# Patient Record
Sex: Male | Born: 1945 | Race: White | Hispanic: No | Smoking: Former smoker
Health system: Southern US, Community
[De-identification: ages and names within clinical notes are randomized; demographics above are authoritative.]

## PROBLEM LIST (undated history)

## (undated) DIAGNOSIS — M545 Low back pain, unspecified: Secondary | ICD-10-CM

## (undated) DIAGNOSIS — E559 Vitamin D deficiency, unspecified: Secondary | ICD-10-CM

## (undated) DIAGNOSIS — E785 Hyperlipidemia, unspecified: Secondary | ICD-10-CM

## (undated) DIAGNOSIS — G20A1 Parkinson's disease without dyskinesia, without mention of fluctuations: Secondary | ICD-10-CM

## (undated) DIAGNOSIS — K219 Gastro-esophageal reflux disease without esophagitis: Secondary | ICD-10-CM

## (undated) DIAGNOSIS — I10 Essential (primary) hypertension: Secondary | ICD-10-CM

## (undated) DIAGNOSIS — R296 Repeated falls: Secondary | ICD-10-CM

## (undated) DIAGNOSIS — R51 Headache: Secondary | ICD-10-CM

## (undated) DIAGNOSIS — G8929 Other chronic pain: Secondary | ICD-10-CM

## (undated) DIAGNOSIS — G20 Parkinson's disease: Secondary | ICD-10-CM

## (undated) DIAGNOSIS — M1990 Unspecified osteoarthritis, unspecified site: Secondary | ICD-10-CM

## (undated) DIAGNOSIS — F0390 Unspecified dementia without behavioral disturbance: Secondary | ICD-10-CM

## (undated) HISTORY — PX: TONSILLECTOMY: SUR1361

## (undated) HISTORY — DX: Hyperlipidemia, unspecified: E78.5

---
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
## 1991-10-28 HISTORY — PX: EXCISIONAL HEMORRHOIDECTOMY: SHX1541

## 1994-09-03 DIAGNOSIS — R209 Unspecified disturbances of skin sensation: Secondary | ICD-10-CM

## 2001-04-09 ENCOUNTER — Encounter: Admission: RE | Admit: 2001-04-09 | Discharge: 2001-05-26 | Payer: Self-pay | Admitting: Family Medicine

## 2002-01-27 ENCOUNTER — Encounter: Admission: RE | Admit: 2002-01-27 | Discharge: 2002-02-10 | Payer: Self-pay | Admitting: Family Medicine

## 2004-08-27 DIAGNOSIS — K219 Gastro-esophageal reflux disease without esophagitis: Secondary | ICD-10-CM | POA: Insufficient documentation

## 2008-07-17 ENCOUNTER — Ambulatory Visit: Payer: Self-pay | Admitting: Nurse Practitioner

## 2008-07-17 DIAGNOSIS — G4700 Insomnia, unspecified: Secondary | ICD-10-CM | POA: Insufficient documentation

## 2008-07-17 DIAGNOSIS — M545 Low back pain: Secondary | ICD-10-CM

## 2008-07-17 DIAGNOSIS — G589 Mononeuropathy, unspecified: Secondary | ICD-10-CM | POA: Insufficient documentation

## 2008-07-17 LAB — CONVERTED CEMR LAB
Blood Glucose, Fingerstick: 107
Hgb A1c MFr Bld: 5.5 %

## 2008-07-18 ENCOUNTER — Encounter (INDEPENDENT_AMBULATORY_CARE_PROVIDER_SITE_OTHER): Payer: Self-pay | Admitting: Nurse Practitioner

## 2008-07-21 ENCOUNTER — Encounter (INDEPENDENT_AMBULATORY_CARE_PROVIDER_SITE_OTHER): Payer: Self-pay | Admitting: Nurse Practitioner

## 2008-07-24 ENCOUNTER — Ambulatory Visit: Payer: Self-pay | Admitting: Nurse Practitioner

## 2008-07-25 DIAGNOSIS — E7800 Pure hypercholesterolemia, unspecified: Secondary | ICD-10-CM

## 2008-07-25 LAB — CONVERTED CEMR LAB
MCHC: 32.6 g/dL (ref 30.0–36.0)
Platelets: 193 10*3/uL (ref 150–400)
RDW: 13.1 % (ref 11.5–15.5)
Retic Ct Pct: 0.8 % (ref 0.4–3.1)

## 2008-08-21 ENCOUNTER — Ambulatory Visit: Payer: Self-pay | Admitting: Nurse Practitioner

## 2008-08-21 LAB — CONVERTED CEMR LAB
Cholesterol, target level: 200 mg/dL
LDL Goal: 160 mg/dL

## 2008-10-12 ENCOUNTER — Ambulatory Visit: Payer: Self-pay | Admitting: Nurse Practitioner

## 2008-10-12 LAB — CONVERTED CEMR LAB
Alkaline Phosphatase: 39 units/L (ref 39–117)
Bilirubin, Direct: 0.1 mg/dL (ref 0.0–0.3)
Indirect Bilirubin: 0.7 mg/dL (ref 0.0–0.9)
LDL Cholesterol: 102 mg/dL — ABNORMAL HIGH (ref 0–99)
Total Bilirubin: 0.8 mg/dL (ref 0.3–1.2)
Total Protein: 6.7 g/dL (ref 6.0–8.3)
Triglycerides: 88 mg/dL (ref <150)
VLDL: 18 mg/dL (ref 0–40)

## 2008-10-13 ENCOUNTER — Encounter (INDEPENDENT_AMBULATORY_CARE_PROVIDER_SITE_OTHER): Payer: Self-pay | Admitting: Nurse Practitioner

## 2008-10-23 ENCOUNTER — Encounter (INDEPENDENT_AMBULATORY_CARE_PROVIDER_SITE_OTHER): Payer: Self-pay | Admitting: Nurse Practitioner

## 2009-01-23 ENCOUNTER — Ambulatory Visit: Payer: Self-pay | Admitting: Nurse Practitioner

## 2009-01-23 DIAGNOSIS — I10 Essential (primary) hypertension: Secondary | ICD-10-CM

## 2009-05-24 ENCOUNTER — Ambulatory Visit: Payer: Self-pay | Admitting: Nurse Practitioner

## 2009-05-25 ENCOUNTER — Encounter (INDEPENDENT_AMBULATORY_CARE_PROVIDER_SITE_OTHER): Payer: Self-pay | Admitting: Nurse Practitioner

## 2009-05-25 LAB — CONVERTED CEMR LAB
HDL: 56 mg/dL (ref >39)
LDL Cholesterol: 103 mg/dL — ABNORMAL HIGH (ref 0–99)
Total CHOL/HDL Ratio: 3.1
Triglycerides: 64 mg/dL (ref <150)

## 2009-05-29 ENCOUNTER — Encounter (INDEPENDENT_AMBULATORY_CARE_PROVIDER_SITE_OTHER): Payer: Self-pay | Admitting: Nurse Practitioner

## 2009-06-01 ENCOUNTER — Encounter (INDEPENDENT_AMBULATORY_CARE_PROVIDER_SITE_OTHER): Payer: Self-pay | Admitting: Nurse Practitioner

## 2009-06-07 ENCOUNTER — Encounter (INDEPENDENT_AMBULATORY_CARE_PROVIDER_SITE_OTHER): Payer: Self-pay | Admitting: Nurse Practitioner

## 2010-03-28 ENCOUNTER — Ambulatory Visit: Payer: Self-pay | Admitting: Nurse Practitioner

## 2010-03-28 LAB — CONVERTED CEMR LAB
Basophils Absolute: 0 10*3/uL (ref 0.0–0.1)
Basophils Relative: 0 % (ref 0–1)
CO2: 31 meq/L (ref 19–32)
Cholesterol: 191 mg/dL (ref 0–200)
Creatinine, Ser: 0.93 mg/dL (ref 0.40–1.50)
Eosinophils Absolute: 0.1 10*3/uL (ref 0.0–0.7)
Glucose, Bld: 76 mg/dL (ref 70–99)
HDL: 59 mg/dL (ref >39)
MCHC: 32 g/dL (ref 30.0–36.0)
MCV: 96.2 fL (ref 78.0–100.0)
Monocytes Absolute: 0.3 10*3/uL (ref 0.1–1.0)
Monocytes Relative: 7 % (ref 3–12)
Neutrophils Relative %: 56 % (ref 43–77)
RBC: 4.51 M/uL (ref 4.22–5.81)
RDW: 13.3 % (ref 11.5–15.5)
Total Bilirubin: 0.8 mg/dL (ref 0.3–1.2)
Total CHOL/HDL Ratio: 3.2
Triglycerides: 68 mg/dL (ref <150)
VLDL: 14 mg/dL (ref 0–40)

## 2010-03-29 ENCOUNTER — Encounter (INDEPENDENT_AMBULATORY_CARE_PROVIDER_SITE_OTHER): Payer: Self-pay | Admitting: Nurse Practitioner

## 2010-11-28 NOTE — Progress Notes (Signed)
Summary: Office Visit//DEPRESSION SCREENING  Office Visit//DEPRESSION SCREENING   Imported By: Arta Bruce 05/06/2010 15:12:23  _____________________________________________________________________  External Attachment:    Type:   Image     Comment:   External Document

## 2010-11-28 NOTE — Letter (Signed)
Summary: Lipid Letter  [facility]  [address] [place], [place] [address]   Phone: [phone]  Fax: [phone]    03/29/2010  Robet Crutchfield [address] [place], [place]  [address]  Dear Molly Maduro:  We have carefully reviewed your last lipid profile from 03/28/2010 and the results are noted below with a summary of recommendations for lipid management.    Cholesterol:       191     Goal: less than 200   HDL "good" Cholesterol:   59     Goal: greater than 40   LDL "bad" Cholesterol:   118     Goal: less than 130   Triglycerides:       68     Goal: less than 150    Labs done during recent office visit were ok.  Your cholesterol medication has been sent to the pharmacy.     Current Medications: 1)    Amitriptyline Hcl 25 Mg Tabs (Amitriptyline hcl) .... One tablet by mouth at night as needed 2)    Ibuprofen 800 Mg Tabs (Ibuprofen) .Marland Kitchen.. 1 tablet by mouth three times a day as needed for pain **take with food** 3)    Aspirin Ec Lo-dose 81 Mg Tbec (Aspirin) .Marland Kitchen.. 1 tablet by mouth daily 4)    Pravachol 20 Mg Tabs (Pravastatin sodium) .Marland Kitchen.. 1 tablet by mouth at night for cholesterol 5)    Flexeril 10 Mg Tabs (Cyclobenzaprine hcl) .... One tablet by mouth nighty as needed for spasms  If you have any questions, please call. We appreciate being able to work with you.   Sincerely,    [facility] Lehman Prom FNP

## 2010-11-28 NOTE — Assessment & Plan Note (Signed)
Summary: Hypercholesterolemia   Vital Signs:  Patient profile:   65 year old male Weight:      159.7 pounds BMI:     25.10 BSA:     1.84 Temp:     97.7 degrees F oral Pulse rate:   76 / minute Pulse rhythm:   regular Resp:     20 per minute BP sitting:   148 / 94  (left arm) Cuff size:   regular  Vitals Entered By: Levon Hedger (March 28, 2010 10:01 AM) CC: follow-up visit cholesterol, Lipid Management, Back Pain Is Patient Diabetic? No Pain Assessment Patient in pain? yes     Location: back Intensity: 8 Onset of pain  Chronic  Does patient need assistance? Functional Status Self care Ambulation Normal   CC:  follow-up visit cholesterol, Lipid Management, and Back Pain.  History of Present Illness:  Pt into the office for follow up.  Tdap - last within 10 years although pt is unsure of how long ago.  Guessing in 5 ago and pt is not willing to get today despite the fact he is not sure when last immunization was given        Back Pain History:      The patient's back pain has been present for > 6 weeks.  The pain is located in the lower back region and does not radiate below the knees.  He states this is not work related.  He states that he has had a prior history of back pain.  The patient has not had any recent physical therapy for his back pain.  The following makes the back pain better: standing.  The following makes the back pain worse: sitting.        Description of injury in patient's own words:  chronic back pain - injured in a accident years ago last week lifted or turned improperly which has flared back pain.  Usually knows to limit activities but pain has persisted.  He is requesting refills on ibuprofen and flexeril (last Rx 1 year ago) during similar flare.    Critical Exclusionary Diagnosis Criteria (CEDC) for Back Pain:      The patient denies a history of previous trauma.  He has no prior history of spinal surgery.  There are no symptoms to suggest  infection, cancer, cauda equina, or psychosocial factors for back pain.  Other positive CEDC factors include low back pain worse with activity.    Lipid Management History:      Positive NCEP/ATP III risk factors include male age 4 years old or older.  Negative NCEP/ATP III risk factors include non-diabetic, no family history for ischemic heart disease, non-tobacco-user status, non-hypertensive, no ASHD (atherosclerotic heart disease), no prior stroke/TIA, no peripheral vascular disease, and no history of aortic aneurysm.        The patient states that he knows about the "Therapeutic Lifestyle Change" diet.  The patient does not know about adjunctive measures for cholesterol lowering.  He expresses no side effects from his lipid-lowering medication.  Comments include: Pt is still taking pravachol as ordered.  The patient denies any symptoms to suggest myopathy or liver disease.      Habits & Providers  Alcohol-Tobacco-Diet     Alcohol drinks/day: 0     Tobacco Status: quit     Tobacco Counseling: to remain off tobacco products     Year Quit: age 83  Exercise-Depression-Behavior     Does Patient Exercise: no  Have you felt down or hopeless? no     Have you felt little pleasure in things? no     Depression Counseling: not indicated; screening negative for depression     Drug Use: no     Seat Belt Use: 100     Sun Exposure: occasionally  Comments: PHQ-9 score = 5  Allergies (verified): No Known Drug Allergies  Review of Systems CV:  Denies chest pain or discomfort. Resp:  Denies cough. GI:  Denies abdominal pain, nausea, and vomiting. GU:  Complains of nocturia; denies dysuria. MS:  Complains of low back pain.  Physical Exam  General:  alert.   Head:  normocephalic.   Eyes:  glasses Lungs:  normal breath sounds.   Heart:  normal rate and regular rhythm.   Prostate:  REFUSED Neurologic:  alert & oriented X3.   slow steady gait   Detailed Back/Spine  Exam  Lumbosacral Exam:  Inspection-deformity:    Normal Palpation-spinal tenderness:  Abnormal    Location:  L4-L5     pt will not sit during the exam   Impression & Recommendations:  Problem # 1:  HYPERCHOLESTEROLEMIA (ICD-272.0) will check labs today His updated medication list for this problem includes:    Pravachol 20 Mg Tabs (Pravastatin sodium) .Marland Kitchen... 1 tablet by mouth at night for cholesterol  Orders: T-Lipid Profile ([address]109-60454) T-Comprehensive Metabolic Panel [phone]) T-CBC w/Diff ([id number]) T-TSH ([id number])  Problem # 2:  ELEVATED BLOOD PRESSURE (ICD-796.2) reviewed indications with pt pt to check at local pharmacy advised him of low sodium diet handout given REFUSED EKG TODAY REFUSED U/A SO UNABLE TO CHECK MICROALBUMIN  Problem # 3:  LOW BACK PAIN, MILD (ICD-724.2) pt advised that he can continue to do back exercises as previously assigned by physical therapy will restart ibuprofen and flexeril may also consider physical therapy if back pain continues His updated medication list for this problem includes:    Ibuprofen 800 Mg Tabs (Ibuprofen) .Marland Kitchen... 1 tablet by mouth three times a day as needed for pain **take with food**    Aspirin Ec Lo-dose 81 Mg Tbec (Aspirin) .Marland Kitchen... 1 tablet by mouth daily    Flexeril 10 Mg Tabs (Cyclobenzaprine hcl) ..... One tablet by mouth nighty as needed for spasms  Orders: T-PSA ([id number]) Rapid HIV  (02725)  Problem # 4:  GERD (ICD-530.81) symptoms stable Orders: T-TSH ([id number])  Problem # 5:  Preventive Health Care (ICD-V70.0) pt declined EKG, rectal and prostate exam  Complete Medication List: 1)  Amitriptyline Hcl 25 Mg Tabs (Amitriptyline hcl) .... One tablet by mouth at night as needed 2)  Ibuprofen 800 Mg Tabs (Ibuprofen) .Marland Kitchen.. 1 tablet by mouth three times a day as needed for pain **take with food** 3)  Aspirin Ec Lo-dose 81 Mg Tbec (Aspirin) .Marland Kitchen.. 1 tablet by mouth daily 4)  Pravachol 20 Mg Tabs  (Pravastatin sodium) .Marland Kitchen.. 1 tablet by mouth at night for cholesterol 5)  Flexeril 10 Mg Tabs (Cyclobenzaprine hcl) .... One tablet by mouth nighty as needed for spasms  Lipid Assessment/Plan:      Based on NCEP/ATP III, the patient's risk factor category is "0-1 risk factors".  The patient's lipid goals are as follows: Total cholesterol goal is 200; LDL cholesterol goal is 160; HDL cholesterol goal is 40; Triglyceride goal is 150.    Patient Instructions: 1)  Your blood pressure today is 148/94 2)  Be sure to keep a watch on your blood pressure 3)  Goal 130/80's 4)  If you see  it consecutively above this number you may need medications. 5)  Cholesterol - labs will be checked today.  Will send a new prescription to the pharmacy once labs reviewed. 6)  Follow up in 6 - 8 months with n.martin for cholesterol and high blood pressure Prescriptions: FLEXERIL 10 MG TABS (CYCLOBENZAPRINE HCL) One tablet by mouth nighty as needed for spasms  #30 x 0   Entered and Authorized by:   Lehman Prom FNP   Signed by:   Lehman Prom FNP on 03/28/2010   Method used:   Print then Give to Patient   RxID:   [id number] IBUPROFEN 800 MG TABS (IBUPROFEN) 1 tablet by mouth three times a day as needed for pain **Take with food**  #50 x 0   Entered and Authorized by:   Lehman Prom FNP   Signed by:   Lehman Prom FNP on 03/28/2010   Method used:   Print then Give to Patient   RxID:   [id number]   Prevention & Chronic Care Immunizations   Influenza vaccine: Not documented    Tetanus booster: 10/28/2003: Historical per pt    Pneumococcal vaccine: Not documented    H. zoster vaccine: Not documented  Colorectal Screening   Hemoccult: Not documented   Hemoccult action/deferral: Refused  (03/28/2010)    Colonoscopy: Not documented   Colonoscopy action/deferral: Refused  (03/28/2010)  Other Screening   PSA: 1.97  (07/24/2008)   PSA ordered.   PSA action/deferral: Discussed-PSA  requested  (03/28/2010)   Smoking status: quit  (03/28/2010)  Lipids   Total Cholesterol: 172  (05/25/2009)   Lipid panel action/deferral: Lipid Panel ordered   LDL: 103  (05/25/2009)   LDL Direct: Not documented   HDL: 56  (05/25/2009)   Triglycerides: 64  (05/25/2009)    SGOT (AST): 24  (10/12/2008)   SGPT (ALT): 29  (10/12/2008) CMP ordered    Alkaline phosphatase: 39  (10/12/2008)   Total bilirubin: 0.8  (10/12/2008)  Self-Management Support :    Lipid self-management support: Not documented   Laboratory Results    Other Tests  Rapid HIV: negative

## 2010-12-11 ENCOUNTER — Encounter (INDEPENDENT_AMBULATORY_CARE_PROVIDER_SITE_OTHER): Payer: Self-pay | Admitting: Nurse Practitioner

## 2010-12-11 ENCOUNTER — Encounter: Payer: Self-pay | Admitting: Nurse Practitioner

## 2010-12-11 DIAGNOSIS — H6120 Impacted cerumen, unspecified ear: Secondary | ICD-10-CM | POA: Insufficient documentation

## 2010-12-18 NOTE — Assessment & Plan Note (Signed)
Summary: Cerumen Impaction   Vital Signs:  Patient profile:   65 year old male Weight:      156.2 pounds BMI:     24.55 Temp:     97.1 degrees F oral Pulse rate:   72 / minute Pulse rhythm:   regular Resp:     16 per minute BP sitting:   150 / 94  (left arm) Cuff size:   regular  Vitals Entered By: Levon Hedger (December 11, 2010 11:27 AM) CC: earache...having trouble hearing out of left ear...glands are swollen, Ear pain, Hypertension Management Is Patient Diabetic? No Pain Assessment Patient in pain? no       Does patient need assistance? Functional Status Self care Ambulation Normal   CC:  earache...having trouble hearing out of left ear...glands are swollen, Ear pain, and Hypertension Management.  History of Present Illness: Pt into the office with c/o ear pain  Ear Pain      This is a 65 year old man who presents with Ear pain.  The symptoms began 1 week ago.  On a scale of mild to severe, the intensity is described as a moderate.  The patient complains of hearing loss, but denies ear discharge and fever.  The pain is located in both ears.  The pain is described as intermittent.  The patient denies headache, pressure, and vertigo.  Admits to using Q-tips in his ears  Hypertension History:      He denies headache, chest pain, and palpitations.  "I get excited when I come here".        Positive major cardiovascular risk factors include male age 63 years old or older, hyperlipidemia, and hypertension.  Negative major cardiovascular risk factors include no history of diabetes, negative family history for ischemic heart disease, and non-tobacco-user status.        Further assessment for target organ damage reveals no history of ASHD, stroke/TIA, or peripheral vascular disease.    Medications Prior to Update: 1)  Amitriptyline Hcl 25 Mg Tabs (Amitriptyline Hcl) .... One Tablet By Mouth At Night As Needed 2)  Ibuprofen 800 Mg Tabs (Ibuprofen) .Marland Kitchen.. 1 Tablet By Mouth Three  Times A Day As Needed For Pain **take With Food** 3)  Aspirin Ec Lo-Dose 81 Mg Tbec (Aspirin) .Marland Kitchen.. 1 Tablet By Mouth Daily 4)  Pravachol 20 Mg Tabs (Pravastatin Sodium) .Marland Kitchen.. 1 Tablet By Mouth At Night For Cholesterol 5)  Flexeril 10 Mg Tabs (Cyclobenzaprine Hcl) .... One Tablet By Mouth Nighty As Needed For Spasms  Allergies (verified): No Known Drug Allergies  Review of Systems General:  Complains of sleep disorder; denies fever; occasionally pt is taking amitriptyline for sleep. Last rx in 2009 and pt is requesting a refill. ENT:  Complains of decreased hearing and earache. CV:  Denies chest pain or discomfort. Resp:  Denies cough. GI:  Denies abdominal pain, nausea, and vomiting.  Physical Exam  General:  alert.   Head:  normocephalic.   Eyes:  glasses Ears:  bil ears with cerumen impaction L>R unable to visualize TM after procedure left - erythema to canal with some cerumen right - still with complete occlusion of TM   Impression & Recommendations:  Problem # 1:  CERUMEN IMPACTION, BILATERAL (ICD-380.4)  unable to see TM following irrigation  pt to use ear drops and return in 1 weeks for irrigation  Orders: Cerumen Impaction Removal (29528)  Problem # 2:  HYPERCHOLESTEROLEMIA (ICD-272.0)  His updated medication list for this problem includes:  Pravachol 20 Mg Tabs (Pravastatin sodium) .Marland Kitchen... 1 tablet by mouth at night for cholesterol  Problem # 3:  INSOMNIA (ICD-780.52) will refill amitriptyline  Problem # 4:  HYPERTENSION, ESSENTIAL (ICD-401.9)  His updated medication list for this problem includes:    Lisinopril 5 Mg Tabs (Lisinopril) ..... One tablet by mouth daily for blood pressure  Complete Medication List: 1)  Amitriptyline Hcl 25 Mg Tabs (Amitriptyline hcl) .... One tablet by mouth at night as needed 2)  Ibuprofen 800 Mg Tabs (Ibuprofen) .Marland Kitchen.. 1 tablet by mouth three times a day as needed for pain **take with food** 3)  Aspirin Ec Lo-dose 81 Mg Tbec  (Aspirin) .Marland Kitchen.. 1 tablet by mouth daily 4)  Pravachol 20 Mg Tabs (Pravastatin sodium) .Marland Kitchen.. 1 tablet by mouth at night for cholesterol 5)  Flexeril 10 Mg Tabs (Cyclobenzaprine hcl) .... One tablet by mouth nighty as needed for spasms 6)  Lisinopril 5 Mg Tabs (Lisinopril) .... One tablet by mouth daily for blood pressure 7)  Antipyrine-benzocaine 5.4-1.4 % Soln (Benzocaine-antipyrine) .... 2 drops in both ears two times a day  Hypertension Assessment/Plan:      The patient's hypertensive risk group is category B: At least one risk factor (excluding diabetes) with no target organ damage.  His calculated 10 year risk of coronary heart disease is 18 %.  Today's blood pressure is 150/94.  His blood pressure goal is < 140/90.   Patient Instructions: 1)  Remember do not use Q-tips in your ears.  This only pushes the ear wax back into your ear. 2)  You can get debrox over the counter to soften the wax so it can be removed with your fingertips. 3)  Use ear drops - 2 drops in each ear for 1 week 4)  Blood pressure - elevated on mulitple visits.  You do need to increase your exercise but also need to start a low dose BP medication to take some of the stress off your heart. 5)  Schedule Triage nurse visit in 1 week for ear irrigation and blood pressure check. Take your medications before this visit 6)  Follow up with provider in 6 months for cholesterol. Come fasting for labs. Prescriptions: ANTIPYRINE-BENZOCAINE 5.4-1.4 % SOLN (BENZOCAINE-ANTIPYRINE) 2 drops in both ears two times a day  #92ml x 0   Entered and Authorized by:   Lehman Prom FNP   Signed by:   Lehman Prom FNP on 12/11/2010   Method used:   Print then Give to Patient   RxID:   [id number] LISINOPRIL 5 MG TABS (LISINOPRIL) One tablet by mouth daily for blood pressure  #30 x 5   Entered and Authorized by:   Lehman Prom FNP   Signed by:   Lehman Prom FNP on 12/11/2010   Method used:   Print then Give to Patient   RxID:    [id number] PRAVACHOL 20 MG TABS (PRAVASTATIN SODIUM) 1 tablet by mouth at night for cholesterol  #90 x 1   Entered and Authorized by:   Lehman Prom FNP   Signed by:   Lehman Prom FNP on 12/11/2010   Method used:   Print then Give to Patient   RxID:   [id number] AMITRIPTYLINE HCL 25 MG TABS (AMITRIPTYLINE HCL) One tablet by mouth at night as needed  #30 x 1   Entered and Authorized by:   Lehman Prom FNP   Signed by:   Lehman Prom FNP on 12/11/2010   Method used:   Print then Give to Patient  RxID:   [id number]    Orders Added: 1)  Est. Patient Level III [14782] 2)  Cerumen Impaction Removal [95621]

## 2010-12-19 ENCOUNTER — Encounter: Payer: Self-pay | Admitting: Nurse Practitioner

## 2010-12-19 ENCOUNTER — Encounter (INDEPENDENT_AMBULATORY_CARE_PROVIDER_SITE_OTHER): Payer: Self-pay | Admitting: Nurse Practitioner

## 2010-12-24 NOTE — Assessment & Plan Note (Signed)
Summary: BP recheck and ear irrigation  Nurse Visit   Vital Signs:  Patient profile:   65 year old male Weight:      156.5 pounds Temp:     96.9 degrees F oral Pulse rate:   64 / minute Pulse rhythm:   regular Resp:     16 per minute BP sitting:   124 / 88  (left arm) Cuff size:   regular  Vitals Entered By: Dutch Quint RN (December 19, 2010 2:24 PM)  CC:  BP recheck and ear irrigation.  History of Present Illness: 12/11/10  BP 150/94  P 72.  Had cerumen impaction, ears irrigated.  Was to use ear drops and return for f/u irrigation with BP check after starting lisinopril 5 mg. daily.  Used the ear drops as instructed, also taking lisinopril daily, took meds today.   Review of Systems ENT:  Hearing slightly improved in left ear, denies ear pain, discharge, redness or pressure.. CV:  Denies CP, SOB, dizziness, peripheral edema, cough, headache.  Has occasional foot cramping, resolves without intervention other than movement of extremity..   Impression & Recommendations:  Problem # 1:  CERUMEN IMPACTION, BILATERAL (ICD-380.4)  Both ears irrigated after use of otic drops Return of large amount dark cerumen States hearing improved F/U as needed  Orders: Cerumen Impaction Removal (84132)  Problem # 2:  HYPERTENSION, ESSENTIAL (ICD-401.9) BP within normal range F/U at scheduled appt. with provider  His updated medication list for this problem includes:    Lisinopril 5 Mg Tabs (Lisinopril) ..... One tablet by mouth daily for blood pressure  Complete Medication List: 1)  Amitriptyline Hcl 25 Mg Tabs (Amitriptyline hcl) .... One tablet by mouth at night as needed 2)  Ibuprofen 800 Mg Tabs (Ibuprofen) .Marland Kitchen.. 1 tablet by mouth three times a day as needed for pain **take with food** 3)  Aspirin Ec Lo-dose 81 Mg Tbec (Aspirin) .Marland Kitchen.. 1 tablet by mouth daily 4)  Pravachol 20 Mg Tabs (Pravastatin sodium) .Marland Kitchen.. 1 tablet by mouth at night for cholesterol 5)  Flexeril 10 Mg Tabs  (Cyclobenzaprine hcl) .... One tablet by mouth nighty as needed for spasms 6)  Lisinopril 5 Mg Tabs (Lisinopril) .... One tablet by mouth daily for blood pressure 7)  Antipyrine-benzocaine 5.4-1.4 % Soln (Benzocaine-antipyrine) .... 2 drops in both ears two times a day   Physical Exam  General:  alert, well-developed, well-nourished, and well-hydrated.   Ears:  Right ear reddened at top of canal, cerumen present.  After irrigation, TM visualized.  Left TM unable to visualize before irrigation - no redness noted, TM visualized after irrigation, normal   Patient Instructions: 1)  Reviewed with Jesse Fall 2)  Your blood pressure is within normal range 3)  Continue medications as ordered, important that you take daily, without missing doses.  Call for refills before you run out. 4)  Your ears were irrigated and wax was removed.  Your ears may feel "full" for a little while, until the water dries.  You are already having improvement with your hearing -- let us know if that changes. 5)  Keep your scheduled appointment with provider for follow-up with your blood pressure and general health. 6)  Call if anything changes or if you have any questions.  CC: BP recheck and ear irrigation Is Patient Diabetic? No Pain Assessment Patient in pain? no       Does patient need assistance? Functional Status Self care Ambulation Normal   Allergies: No Known Drug Allergies  Orders Added: 1)  Est. Patient Level I [16109] 2)  Cerumen Impaction Removal [60454]

## 2013-11-30 ENCOUNTER — Emergency Department (HOSPITAL_COMMUNITY): Payer: PRIVATE HEALTH INSURANCE

## 2013-11-30 ENCOUNTER — Inpatient Hospital Stay (HOSPITAL_COMMUNITY)
Admission: EM | Admit: 2013-11-30 | Discharge: 2013-12-02 | DRG: 558 | Disposition: A | Discharge status: Skilled Nursing Facility | Payer: PRIVATE HEALTH INSURANCE | Attending: Internal Medicine | Admitting: Internal Medicine

## 2013-11-30 ENCOUNTER — Encounter (HOSPITAL_COMMUNITY): Payer: Self-pay | Admitting: Emergency Medicine

## 2013-11-30 DIAGNOSIS — Z87891 Personal history of nicotine dependence: Secondary | ICD-10-CM

## 2013-11-30 DIAGNOSIS — Z9181 History of falling: Secondary | ICD-10-CM

## 2013-11-30 DIAGNOSIS — G912 (Idiopathic) normal pressure hydrocephalus: Secondary | ICD-10-CM | POA: Diagnosis present

## 2013-11-30 DIAGNOSIS — Y92009 Unspecified place in unspecified non-institutional (private) residence as the place of occurrence of the external cause: Secondary | ICD-10-CM

## 2013-11-30 DIAGNOSIS — I10 Essential (primary) hypertension: Secondary | ICD-10-CM | POA: Diagnosis present

## 2013-11-30 DIAGNOSIS — G20 Parkinson's disease: Secondary | ICD-10-CM | POA: Diagnosis present

## 2013-11-30 DIAGNOSIS — E860 Dehydration: Secondary | ICD-10-CM

## 2013-11-30 DIAGNOSIS — H5316 Psychophysical visual disturbances: Secondary | ICD-10-CM | POA: Diagnosis present

## 2013-11-30 DIAGNOSIS — E46 Unspecified protein-calorie malnutrition: Secondary | ICD-10-CM | POA: Diagnosis present

## 2013-11-30 DIAGNOSIS — R209 Unspecified disturbances of skin sensation: Secondary | ICD-10-CM

## 2013-11-30 DIAGNOSIS — F0280 Dementia in other diseases classified elsewhere without behavioral disturbance: Secondary | ICD-10-CM | POA: Diagnosis present

## 2013-11-30 DIAGNOSIS — W19XXXA Unspecified fall, initial encounter: Secondary | ICD-10-CM

## 2013-11-30 DIAGNOSIS — R269 Unspecified abnormalities of gait and mobility: Secondary | ICD-10-CM

## 2013-11-30 DIAGNOSIS — K219 Gastro-esophageal reflux disease without esophagitis: Secondary | ICD-10-CM

## 2013-11-30 DIAGNOSIS — M6282 Rhabdomyolysis: Principal | ICD-10-CM | POA: Diagnosis present

## 2013-11-30 DIAGNOSIS — R64 Cachexia: Secondary | ICD-10-CM | POA: Diagnosis present

## 2013-11-30 DIAGNOSIS — R296 Repeated falls: Secondary | ICD-10-CM

## 2013-11-30 DIAGNOSIS — R413 Other amnesia: Secondary | ICD-10-CM

## 2013-11-30 DIAGNOSIS — I959 Hypotension, unspecified: Secondary | ICD-10-CM | POA: Diagnosis present

## 2013-11-30 DIAGNOSIS — IMO0002 Reserved for concepts with insufficient information to code with codable children: Secondary | ICD-10-CM

## 2013-11-30 DIAGNOSIS — G20A1 Parkinson's disease without dyskinesia, without mention of fluctuations: Secondary | ICD-10-CM | POA: Diagnosis present

## 2013-11-30 DIAGNOSIS — R159 Full incontinence of feces: Secondary | ICD-10-CM | POA: Diagnosis present

## 2013-11-30 DIAGNOSIS — R32 Unspecified urinary incontinence: Secondary | ICD-10-CM | POA: Diagnosis present

## 2013-11-30 HISTORY — DX: Low back pain: M54.5

## 2013-11-30 HISTORY — DX: Other chronic pain: G89.29

## 2013-11-30 HISTORY — DX: Low back pain, unspecified: M54.50

## 2013-11-30 HISTORY — DX: Essential (primary) hypertension: I10

## 2013-11-30 HISTORY — DX: Unspecified dementia, unspecified severity, without behavioral disturbance, psychotic disturbance, mood disturbance, and anxiety: F03.90

## 2013-11-30 HISTORY — DX: Unspecified osteoarthritis, unspecified site: M19.90

## 2013-11-30 HISTORY — DX: Parkinson's disease: G20

## 2013-11-30 HISTORY — DX: Headache: R51

## 2013-11-30 HISTORY — DX: Gastro-esophageal reflux disease without esophagitis: K21.9

## 2013-11-30 HISTORY — DX: Parkinson's disease without dyskinesia, without mention of fluctuations: G20.A1

## 2013-11-30 HISTORY — DX: Repeated falls: R29.6

## 2013-11-30 LAB — URINALYSIS W MICROSCOPIC + REFLEX CULTURE
BILIRUBIN URINE: NEGATIVE
Glucose, UA: NEGATIVE mg/dL
Ketones, ur: 40 mg/dL — AB
NITRITE: NEGATIVE
PROTEIN: 30 mg/dL — AB
Specific Gravity, Urine: 1.029 (ref 1.005–1.030)
UROBILINOGEN UA: 0.2 mg/dL (ref 0.0–1.0)
pH: 6 (ref 5.0–8.0)

## 2013-11-30 LAB — CBC WITH DIFFERENTIAL/PLATELET
Basophils Absolute: 0 10*3/uL (ref 0.0–0.1)
Basophils Relative: 0 % (ref 0–1)
Eosinophils Absolute: 0 10*3/uL (ref 0.0–0.7)
Eosinophils Relative: 0 % (ref 0–5)
HEMATOCRIT: 43 % (ref 39.0–52.0)
Hemoglobin: 15.4 g/dL (ref 13.0–17.0)
Lymphocytes Relative: 7 % — ABNORMAL LOW (ref 12–46)
Lymphs Abs: 0.8 10*3/uL (ref 0.7–4.0)
MCH: 32.6 pg (ref 26.0–34.0)
MCHC: 35.8 g/dL (ref 30.0–36.0)
MCV: 90.9 fL (ref 78.0–100.0)
Monocytes Absolute: 0.5 10*3/uL (ref 0.1–1.0)
Monocytes Relative: 4 % (ref 3–12)
Neutro Abs: 11.3 10*3/uL — ABNORMAL HIGH (ref 1.7–7.7)
Neutrophils Relative %: 90 % — ABNORMAL HIGH (ref 43–77)
PLATELETS: 183 10*3/uL (ref 150–400)
RBC: 4.73 MIL/uL (ref 4.22–5.81)
RDW: 12.5 % (ref 11.5–15.5)
WBC: 12.6 10*3/uL — AB (ref 4.0–10.5)

## 2013-11-30 LAB — TSH: TSH: 3.105 u[IU]/mL (ref 0.350–4.500)

## 2013-11-30 LAB — BASIC METABOLIC PANEL
BUN: 33 mg/dL — ABNORMAL HIGH (ref 6–23)
CALCIUM: 9.1 mg/dL (ref 8.4–10.5)
CHLORIDE: 95 meq/L — AB (ref 96–112)
CO2: 27 meq/L (ref 19–32)
Creatinine, Ser: 0.86 mg/dL (ref 0.50–1.35)
GFR calc Af Amer: >90 mL/min (ref >90)
GFR calc non Af Amer: 89 mL/min — ABNORMAL LOW (ref >90)
Glucose, Bld: 85 mg/dL (ref 70–99)
POTASSIUM: 4 meq/L (ref 3.7–5.3)
SODIUM: 137 meq/L (ref 137–147)

## 2013-11-30 LAB — TROPONIN I: Troponin I: <0.3 ng/mL (ref <0.30)

## 2013-11-30 LAB — CK: Total CK: 10184 U/L — ABNORMAL HIGH (ref 7–232)

## 2013-11-30 LAB — LACTIC ACID, PLASMA: LACTIC ACID, VENOUS: 1.4 mmol/L (ref 0.5–2.2)

## 2013-11-30 LAB — VITAMIN B12: Vitamin B-12: 1212 pg/mL — ABNORMAL HIGH (ref 211–911)

## 2013-11-30 MED ORDER — ACETAMINOPHEN 325 MG PO TABS: 650.0000 mg | ORAL_TABLET | Freq: Four times a day (QID) | ORAL | Status: DC | PRN

## 2013-11-30 MED ORDER — SODIUM CHLORIDE 0.9 % IV BOLUS (SEPSIS): 500.0000 mL | Freq: Once | INTRAVENOUS | Status: DC

## 2013-11-30 MED ORDER — SODIUM CHLORIDE 0.9 % IV SOLN
INTRAVENOUS | Status: DC
  Administered 2013-11-30 – 2013-12-02 (×4): via INTRAVENOUS

## 2013-11-30 MED ORDER — HYDROCODONE-ACETAMINOPHEN 5-325 MG PO TABS: 1.0000 | ORAL_TABLET | ORAL | Status: DC | PRN

## 2013-11-30 MED ORDER — SODIUM CHLORIDE 0.9 % IV BOLUS (SEPSIS)
500.0000 mL | Freq: Once | INTRAVENOUS | Status: AC
  Administered 2013-11-30: 500 mL via INTRAVENOUS

## 2013-11-30 MED ORDER — ENSURE COMPLETE PO LIQD
237.0000 mL | Freq: Two times a day (BID) | ORAL | Status: DC
  Administered 2013-12-01 – 2013-12-02 (×2): 237 mL via ORAL

## 2013-11-30 MED ORDER — SODIUM CHLORIDE 0.9 % IJ SOLN
3.0000 mL | Freq: Two times a day (BID) | INTRAMUSCULAR | Status: DC
  Administered 2013-11-30: 3 mL via INTRAVENOUS

## 2013-11-30 MED ORDER — ENOXAPARIN SODIUM 40 MG/0.4ML ~~LOC~~ SOLN
40.0000 mg | SUBCUTANEOUS | Status: DC
  Administered 2013-11-30 – 2013-12-02 (×3): 40 mg via SUBCUTANEOUS
  Filled 2013-11-30 (×3): qty 0.4

## 2013-11-30 MED ORDER — ACETAMINOPHEN 650 MG RE SUPP: 650.0000 mg | Freq: Four times a day (QID) | RECTAL | Status: DC | PRN

## 2013-11-30 MED ORDER — ROPINIROLE HCL 1 MG PO TABS
1.0000 mg | ORAL_TABLET | Freq: Four times a day (QID) | ORAL | Status: DC
  Administered 2013-11-30 – 2013-12-02 (×8): 1 mg via ORAL
  Filled 2013-11-30 (×9): qty 1

## 2013-11-30 MED ORDER — ONDANSETRON HCL 4 MG/2ML IJ SOLN: 4.0000 mg | Freq: Four times a day (QID) | INTRAMUSCULAR | Status: DC | PRN

## 2013-11-30 MED ORDER — SENNOSIDES-DOCUSATE SODIUM 8.6-50 MG PO TABS: 1.0000 | ORAL_TABLET | Freq: Every evening | ORAL | Status: DC | PRN

## 2013-11-30 MED ORDER — SODIUM CHLORIDE 0.9 % IV SOLN
INTRAVENOUS | Status: DC
  Administered 2013-11-30: 13:00:00 via INTRAVENOUS

## 2013-11-30 MED ORDER — ONDANSETRON HCL 4 MG PO TABS: 4.0000 mg | ORAL_TABLET | Freq: Four times a day (QID) | ORAL | Status: DC | PRN
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
## 2013-11-30 NOTE — ED Notes (Signed)
Pt was not stood up for orthostatic vitals due to weakness and low blood pressure from sitting.

## 2013-11-30 NOTE — ED Notes (Signed)
Pt moved to E46 for better visual access from Nursing Station.

## 2013-11-30 NOTE — ED Notes (Signed)
EMS called out for fall. Pt stated he had not been down long, but stated it was the 3rd time he had fallen in the last 24 hours. Hx of dementia and parkinson's.  Denies pain, but states right elbow is stiff. Abrasions present on skin from previous falls. VS- 117/73 HR-68 RR-16 95% RA

## 2013-11-30 NOTE — Progress Notes (Signed)
Kevin Gould [id number] Code Status: FULL   Admission Data: 11/30/2013 4:07 PM Attending Provider:  Sunnie Gould Kevin W, MD Consults/ Treatment Team:    Kevin Gould is a 68 y.o. male patient admitted from ED awake, alert - oriented  X 3 - no acute distress noted.  VSS - Blood pressure 108/69, pulse 79, temperature 97.8 F (36.6 C), temperature source Oral, resp. rate 17, SpO2 96.00%.  no c/o shortness of breath, no c/o chest pain. Cardiac tele # [id number], in place. IV Fluids:  IV in place, occlusive dsg intact without redness, IV cath forearm left, condition patent and no redness normal saline.  Allergies:  No Known Allergies   Past Medical History  Diagnosis Date  . Dementia   . Parkinson's disease   . Hypertension    Medications Prior to Admission  Medication Sig Dispense Refill  . rOPINIRole (REQUIP) 1 MG tablet Take 1 mg by mouth 4 (four) times daily.       History:  obtained from the patient. Tobacco/alcohol: denied none  Orientation to room, and floor completed with information packet given to patient/family.  Patient declined safety video at this time.  Admission INP armband ID verified with patient/family, and in place.   SR up x 2, fall assessment complete, with patient and family able to verbalize understanding of risk associated with falls, and verbalized understanding to call nsg before up out of bed.  Call light within reach, patient able to voice, and demonstrate understanding.  Reddened sacrum noted on admission. Elbow abrasions and right shin abrasion from fall at home.     Will cont to eval and treat per MD orders.  Kevin Gould, Kevin Gould C, RN 11/30/2013 4:07 PM

## 2013-11-30 NOTE — ED Provider Notes (Signed)
CSN: [id number]     Arrival date & time 11/30/13  1113 History   First MD Initiated Contact with Patient 11/30/13 1151     Chief Complaint  Patient presents with  . Fall    HPI Pt was seen at 1150. Per pt, c/o gradual onset and worsening of persistent generalized weakness/fatigue for the past several weeks, worse over the past several days. Pt states he has fallen 3 times in the past 1 day due to weakness, with his last fall late last evening. States he laid on the floor for several hours before he was able to pull himself up onto some furniture. Pt states he lives alone and his neighbor's check on him regularly, and that was how he was found today. Pt also feels he has been "more confused than usual" over the past several weeks. Denies syncope, no N/V/D, no abd pain, no CP/SOB, no cough, no fevers.    Past Medical History  Diagnosis Date  . Dementia   . Parkinson's disease   . Hypertension    Past Surgical History  Procedure Laterality Date  . Tonsillectomy      History  Substance Use Topics  . Smoking status: Former Games developermoker  . Smokeless tobacco: Not on file  . Alcohol Use: No    Review of Systems ROS: Statement: All systems negative except as marked or noted in the HPI; Constitutional: Negative for fever and chills.+generalized weakness/fatigue. ; ; Eyes: Negative for eye pain, redness and discharge. ; ; ENMT: Negative for ear pain, hoarseness, nasal congestion, sinus pressure and sore throat. ; ; Cardiovascular: Negative for chest pain, palpitations, diaphoresis, dyspnea and peripheral edema. ; ; Respiratory: Negative for cough, wheezing and stridor. ; ; Gastrointestinal: Negative for nausea, vomiting, diarrhea, abdominal pain, blood in stool, hematemesis, jaundice and rectal bleeding. . ; ; Genitourinary: Negative for dysuria, flank pain and hematuria. ; ; Musculoskeletal: Negative for back pain and neck pain. Negative for swelling and trauma.; ; Skin: Negative for pruritus, rash,  abrasions, blisters, bruising and skin lesion.; ; Neuro: +confusion.Negative for headache, lightheadedness and neck stiffness. Negative for altered level of consciousness , altered mental status, extremity weakness, paresthesias, involuntary movement, seizure and syncope.     Allergies  Review of patient's allergies indicates no known allergies.  Home Medications   Current Outpatient Rx  Name  Route  Sig  Dispense  Refill  . rOPINIRole (REQUIP) 1 MG tablet   Oral   Take 1 mg by mouth 4 (four) times daily.          BP 87/74  Pulse 90  Temp(Src) 97.6 F (36.4 C) (Oral)  Resp 16  SpO2 99% Filed Vitals:   11/30/13 1200 11/30/13 1334 11/30/13 1335 11/30/13 1400  BP: 111/95 98/70 87/74  104/65  Pulse: 86 73 90 87  Temp:      TempSrc:      Resp:    16  SpO2: 99%   100%    Physical Exam 1155: Physical examination:  Nursing notes reviewed; Vital signs and O2 SAT reviewed;  Constitutional: Thin, disheveled. In no acute distress; Head:  Normocephalic, atraumatic; Eyes: EOMI, PERRL, No scleral icterus; ENMT: Mouth and pharynx normal, Mucous membranes dry; Neck: Supple, Full range of motion, No lymphadenopathy; Cardiovascular: Regular rate and rhythm, No gallop; Respiratory: Breath sounds clear & equal bilaterally, No wheezes.  Speaking full sentences with ease, Normal respiratory effort/excursion; Chest: Nontender, Movement normal; Abdomen: Soft, Nontender, Nondistended, Normal bowel sounds; Genitourinary: No CVA tenderness; Extremities: Pulses normal,  No tenderness, No edema, No calf edema or asymmetry.; Neuro: AA&Ox3, Major CN grossly intact. No facial droop. Speech clear. Moves extremities on stretcher..; Skin: Color normal, Warm, Dry.   ED Course  Procedures      EKG Interpretation   None       MDM  MDM Reviewed: previous chart, nursing note and vitals Reviewed previous: labs and ECG Interpretation: labs, ECG, x-ray and CT scan Total time providing critical care: 30-74  minutes. This excludes time spent performing separately reportable procedures and services. Consults: admitting MD   CRITICAL CARE Performed by: Laray Anger Total critical care time: 35 Critical care time was exclusive of separately billable procedures and treating other patients. Critical care was necessary to treat or prevent imminent or life-threatening deterioration. Critical care was time spent personally by me on the following activities: development of treatment plan with patient and/or surrogate as well as nursing, discussions with consultants, evaluation of patient's response to treatment, examination of patient, obtaining history from patient or surrogate, ordering and performing treatments and interventions, ordering and review of laboratory studies, ordering and review of radiographic studies, pulse oximetry and re-evaluation of patient's condition.   Date: 11/30/2013  Rate: 74  Rhythm: normal sinus rhythm and premature atrial contractions (PAC), artifact  QRS Axis: normal  Intervals: normal  ST/T Wave abnormalities: normal  Conduction Disutrbances:none  Narrative Interpretation:   Old EKG Reviewed: none available.      Results for orders placed during the hospital encounter of 11/30/13  URINALYSIS W MICROSCOPIC + REFLEX CULTURE      Result Value Range   Color, Urine AMBER (*) YELLOW   APPearance HAZY (*) CLEAR   Specific Gravity, Urine 1.029  1.005 - 1.030   pH 6.0  5.0 - 8.0   Glucose, UA NEGATIVE  NEGATIVE mg/dL   Hgb urine dipstick MODERATE (*) NEGATIVE   Bilirubin Urine NEGATIVE  NEGATIVE   Ketones, ur 40 (*) NEGATIVE mg/dL   Protein, ur 30 (*) NEGATIVE mg/dL   Urobilinogen, UA 0.2  0.0 - 1.0 mg/dL   Nitrite NEGATIVE  NEGATIVE   Leukocytes, UA SMALL (*) NEGATIVE   WBC, UA 7-10  <3 WBC/hpf   RBC / HPF 7-10  <3 RBC/hpf   Bacteria, UA FEW (*) RARE   Squamous Epithelial / LPF RARE  RARE   Urine-Other SPERM PRESENT    BASIC METABOLIC PANEL      Result  Value Range   Sodium 137  137 - 147 mEq/L   Potassium 4.0  3.7 - 5.3 mEq/L   Chloride 95 (*) 96 - 112 mEq/L   CO2 27  19 - 32 mEq/L   Glucose, Bld 85  70 - 99 mg/dL   BUN 33 (*) 6 - 23 mg/dL   Creatinine, Ser 1.61  0.50 - 1.35 mg/dL   Calcium 9.1  8.4 - 09.6 mg/dL   GFR calc non Af Amer 89 (*) >90 mL/min   GFR calc Af Amer >90  >90 mL/min  CBC WITH DIFFERENTIAL      Result Value Range   WBC 12.6 (*) 4.0 - 10.5 K/uL   RBC 4.73  4.22 - 5.81 MIL/uL   Hemoglobin 15.4  13.0 - 17.0 g/dL   HCT 04.5  40.9 - 81.1 %   MCV 90.9  78.0 - 100.0 fL   MCH 32.6  26.0 - 34.0 pg   MCHC 35.8  30.0 - 36.0 g/dL   RDW 91.4  78.2 - 95.6 %   Platelets  183  150 - 400 K/uL   Neutrophils Relative % 90 (*) 43 - 77 %   Neutro Abs 11.3 (*) 1.7 - 7.7 K/uL   Lymphocytes Relative 7 (*) 12 - 46 %   Lymphs Abs 0.8  0.7 - 4.0 K/uL   Monocytes Relative 4  3 - 12 %   Monocytes Absolute 0.5  0.1 - 1.0 K/uL   Eosinophils Relative 0  0 - 5 %   Eosinophils Absolute 0.0  0.0 - 0.7 K/uL   Basophils Relative 0  0 - 1 %   Basophils Absolute 0.0  0.0 - 0.1 K/uL  LACTIC ACID, PLASMA      Result Value Range   Lactic Acid, Venous 1.4  0.5 - 2.2 mmol/L  TROPONIN I      Result Value Range   Troponin I <0.30  <0.30 ng/mL  CK      Result Value Range   Total CK 10184 (*) 7 - 232 U/L   Dg Chest 2 View 11/30/2013   CLINICAL DATA:  Near syncope, weakness, and fall  EXAM: CHEST  2 VIEW  COMPARISON:  None.  FINDINGS: The lungs are mildly hyperinflated and clear. There is no pneumothorax or pleural effusion. The cardiac silhouette is normal in size. The mediastinum is normal in width. The observed portions of the bony thorax appear normal.  IMPRESSION: There is hyperinflation consistent with COPD. There is no evidence of pneumonia nor CHF nor other acute cardiopulmonary abnormality.   Electronically Signed   By: [name]  [place]   On: 11/30/2013 12:44   Ct Head Wo Contrast 11/30/2013   CLINICAL DATA:  Fall.  Weakness.  EXAM: CT HEAD  WITHOUT CONTRAST  TECHNIQUE: Contiguous axial images were obtained from the base of the skull through the vertex without intravenous contrast.  COMPARISON:  None.  FINDINGS: Prominent occipital horns lateral ventricles. Prominent temporal horns of the lateral ventricles, right greater than left, with slight irregularity of the right temporal horn, which is more stellate in cross-section rather than the gently curved as on the left side.  Hypodensity in the lateral part of the transverse sinus on the right measures 1.3 x 0.7 cm, compatible with arachnoid granulation.  Borderline prominence of the third ventricle. The fourth ventricle does not appear particularly prominent. The carpus callosum appears to be present anteriorly.  Periventricular white matter and corona radiata hypodensities favor chronic ischemic microvascular white matter disease. No intracranial hemorrhage, mass lesion, or acute CVA.  Chronic maxillary, ethmoid, and right frontal sinusitis.  IMPRESSION: 1. Prominent and somewhat irregular temporal horn of the right lateral ventricle, with slight prominence of the contralateral lateral ventricle and occipital horns. There are potential causes. One is that this patient simply has mild prominence of the lateral ventricles, and also some encephalomalacia from prior insult along the temporal horn of the right lateral ventricle. This is favored given the asymmetry. The other possibility is that the patient does indeed have mild hydrocephalus which is manifesting more prominently in the right temporal horn. MRI of the brain could be utilized to assess for more specific findings of gliosis/encephalomalacia to help differentiate these two possibilities. 2. Chronic paranasal sinusitis. 3. 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 4. Mild chronic ischemic microvascular white matter disease.   Electronically Signed   By: Herbie Baltimore M.D.   On: 11/30/2013 12:47    1415:  Pt appears  dehydrated and is unable to stand during orthostatic VS due to dropping BP. Multiple  IVF boluses given with improvement in BP. Will continue IVF to tx elevated CK. T/C to [facility], case discussed, including:  HPI, pertinent PM/SHx, VS/PE, dx testing, ED course and treatment:  Agreeable to admit, requests to write temporary orders, obtain inpatient tele bed to team 10.   Laray Anger, DO 12/02/13 1549

## 2013-11-30 NOTE — Progress Notes (Signed)
2:49 PM Attempted to get report. ED RN to call back.   3:02 PM Report received from ED RN for admission to [id number]

## 2013-11-30 NOTE — H&P (Signed)
[facility] History and Physical  ALAM GUTERREZ JYN:[id number] DOB: Oct 23, 1946 DOA: 11/30/2013  Referring physician: Dr Clarene Duke.  PCP: [name], MD   Chief Complaint: frequent fall, weakness.   HPI: Kevin Gould is a 68 y.o. male with PMH significant for Parkinson diseases, hypertension who presents to ED after a fall. Patient fell today. He call EMS. He was found on the floor. He was not able to stand up because of weakness. He was on the floor for 4 hours or more. History was obtain from patient. He has fallen multiple times over last 2 days. He also mention visual and auditory hallucination for few days. He feels he doesn't know what is real or not. He denies suicidal thought or plan at this time. He does relates moments of no joy or live purpose.  He does relates memory problems, since 1 week ago. He also relates several episodes of urinary and bowel incontinence for last week. He relates that at time he doesn't know where he is at.    Review of Systems:  Negative except as per HPI.   Past Medical History  Diagnosis Date  . Dementia   . Parkinson's disease   . Hypertension    Past Surgical History  Procedure Laterality Date  . Tonsillectomy     Social History:  reports that he has quit smoking. He does not have any smokeless tobacco history on file. He reports that he does not drink alcohol or use illicit drugs.  No Known Allergies  History reviewed. No pertinent family history.   Prior to Admission medications   Medication Sig Start Date End Date Taking? Authorizing Provider  rOPINIRole (REQUIP) 1 MG tablet Take 1 mg by mouth 4 (four) times daily.   Yes Historical Provider, MD   Physical Exam: Filed Vitals:   11/30/13 1445  BP: 102/67  Pulse: 81  Temp:   Resp: 17    BP 102/67  Pulse 81  Temp(Src) 97.6 F (36.4 C) (Oral)  Resp 17  SpO2 100%  General:  Appears calm and comfortable, cachetic.  Eyes: PERRL, normal lids, irises &  conjunctiva ENT: grossly normal hearing, lips & tongue Neck: no LAD, masses or thyromegaly Cardiovascular: RRR, no m/r/g. No LE edema. pactum excavatum.  Telemetry: SR, no arrhythmias  Respiratory: CTA bilaterally, no w/r/r. Normal respiratory effort. Abdomen: soft, ntnd Skin: no rash or induration seen on limited exam Musculoskeletal: grossly normal tone BUE/BLE Neurologic: speech clear, memory problems, difficulty moving right lower extremity. Right arm rigidity.           Labs on Admission:  Basic Metabolic Panel:  Recent Labs Lab 11/30/13 1211  NA 137  K 4.0  CL 95*  CO2 27  GLUCOSE 85  BUN 33*  CREATININE 0.86  CALCIUM 9.1   Liver Function Tests: No results found for this basename: AST, ALT, ALKPHOS, BILITOT, PROT, ALBUMIN,  in the last 168 hours No results found for this basename: LIPASE, AMYLASE,  in the last 168 hours No results found for this basename: AMMONIA,  in the last 168 hours CBC:  Recent Labs Lab 11/30/13 1211  WBC 12.6*  NEUTROABS 11.3*  HGB 15.4  HCT 43.0  MCV 90.9  PLT 183   Cardiac Enzymes:  Recent Labs Lab 11/30/13 1211  CKTOTAL 86578*  TROPONINI <0.30    BNP (last 3 results) No results found for this basename: PROBNP,  in the last 8760 hours CBG: No results found for this basename: GLUCAP,  in the  last 168 hours  Radiological Exams on Admission: Dg Chest 2 View  11/30/2013   CLINICAL DATA:  Near syncope, weakness, and fall  EXAM: CHEST  2 VIEW  COMPARISON:  None.  FINDINGS: The lungs are mildly hyperinflated and clear. There is no pneumothorax or pleural effusion. The cardiac silhouette is normal in size. The mediastinum is normal in width. The observed portions of the bony thorax appear normal.  IMPRESSION: There is hyperinflation consistent with COPD. There is no evidence of pneumonia nor CHF nor other acute cardiopulmonary abnormality.   Electronically Signed   By: [name]  Swazil[place]   On: 11/30/2013 12:44   Ct Head Wo  Contrast  11/30/2013   CLINICAL DATA:  Fall.  Weakness.  EXAM: CT HEAD WITHOUT CONTRAST  TECHNIQUE: Contiguous axial images were obtained from the base of the skull through the vertex without intravenous contrast.  COMPARISON:  None.  FINDINGS: Prominent occipital horns lateral ventricles. Prominent temporal horns of the lateral ventricles, right greater than left, with slight irregularity of the right temporal horn, which is more stellate in cross-section rather than the gently curved as on the left side.  Hypodensity in the lateral part of the transverse sinus on the right measures 1.3 x 0.7 cm, compatible with arachnoid granulation.  Borderline prominence of the third ventricle. The fourth ventricle does not appear particularly prominent. The carpus callosum appears to be present anteriorly.  Periventricular white matter and corona radiata hypodensities favor chronic ischemic microvascular white matter disease. No intracranial hemorrhage, mass lesion, or acute CVA.  Chronic maxillary, ethmoid, and right frontal sinusitis.  IMPRESSION: 1. Prominent and somewhat irregular temporal horn of the right lateral ventricle, with slight prominence of the contralateral lateral ventricle and occipital horns. There are potential causes. One is that this patient simply has mild prominence of the lateral ventricles, and also some encephalomalacia from prior insult along the temporal horn of the right lateral ventricle. This is favored given the asymmetry. The other possibility is that the patient does indeed have mild hydrocephalus which is manifesting more prominently in the right temporal horn. MRI of the brain could be utilized to assess for more specific findings of gliosis/encephalomalacia to help differentiate these two possibilities. 2. Chronic paranasal sinusitis. 3. 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 4. Mild chronic ischemic microvascular white matter disease.   Electronically Signed   By:  Herbie BaltimoreWalt  Liebkemann M.D.   On: 11/30/2013 12:47    EKG: ordered.   Assessment/Plan Active Problems:   Rhabdomyolysis   Hypotension   Frequent falls   Memory problem  1-Rhabdomyolysis; patient was on the floor for unknown amount of time. Total Ck at 10,000, I will start IV fluids 150 cc per hour for 8 hours then 125 cc. Monitor renal function. Repeat CK level in am.   2-Frequent fall, gait problem. History of parkinson. Suspect hypotension playing a role. IV fluids. Need PT evaluation.   3-Hypotension: suspect secondary to dehydration, poor oral intake. Also orthostatic hypotension, autonomic dysfunction is in the differential with history of parkinson diseases.  IV fluids. He has not been taking BP medications. Repeat orthostatic in am.   4-Memory problems, Gait abnormality, hallucinations, urine and bowel incontinence. CT head with prominent ventricle. MRI ordered. Neurology consulted further evaluate for NPH. Will also order B12, RPR, TSH, HIV.   5-Malnutrition; regular diet. Ensure. Nutrition consulted.  6-Pyuria; follow urine culture. Consider antibiotics depending on culture results.  7-Parkinson; on Requip.   Code Status: presume full code.  Family Communication: Care discussed with patient and a friend who was at bedside.  Disposition Plan: expect 2 to 3 days.   Time spent: 75 minutes.   [facility] Pager [phone]

## 2013-11-30 NOTE — ED Notes (Signed)
Pt undressed, in gown, on bp cuff and pulse ox, cleaned up pt and applied diaper and 2 chucks under pt, threw soiled underware away, pt aware

## 2013-12-01 ENCOUNTER — Inpatient Hospital Stay (HOSPITAL_COMMUNITY): Payer: PRIVATE HEALTH INSURANCE

## 2013-12-01 DIAGNOSIS — K219 Gastro-esophageal reflux disease without esophagitis: Secondary | ICD-10-CM

## 2013-12-01 DIAGNOSIS — R413 Other amnesia: Secondary | ICD-10-CM

## 2013-12-01 DIAGNOSIS — Z9181 History of falling: Secondary | ICD-10-CM

## 2013-12-01 DIAGNOSIS — R209 Unspecified disturbances of skin sensation: Secondary | ICD-10-CM

## 2013-12-01 LAB — URINE CULTURE: Colony Count: 8000

## 2013-12-01 LAB — HIV ANTIBODY (ROUTINE TESTING W REFLEX): HIV: NONREACTIVE

## 2013-12-01 LAB — RPR: RPR Ser Ql: NONREACTIVE

## 2013-12-01 NOTE — Care Management Note (Signed)
    Page 1 of 1   12/02/2013     12:51:06 PM   CARE MANAGEMENT NOTE 12/02/2013  Patient:  Kevin Gould,Kevin Gould   Account Number:  [id number]  Date Initiated:  12/01/2013  Documentation initiated by:  Letha CapeAYLOR,Kairi Harshbarger  Subjective/Objective Assessment:   dx rhabdo, recent pna  admit-lives alone,     Action/Plan:   pt /ot eval   Anticipated DC Date:  12/02/2013   Anticipated DC Plan:  SKILLED NURSING FACILITY  In-house referral  Clinical Social Worker      DC Planning Services  CM consult      [facility]  DURABLE MEDICAL EQUIPMENT   Choice offered to / List presented to:             Status of service:  Completed, signed off Medicare Important Message given?   (If response is "NO", the following Medicare IM given date fields will be blank) Date Medicare IM given:   Date Additional Medicare IM given:    Discharge Disposition:  SKILLED NURSING FACILITY  Per UR Regulation:  Reviewed for med. necessity/level of care/duration of stay  If discussed at Long Length of Stay Meetings, dates discussed:    Comments:  12/02/13 12:50 Letha Capeeborah Taneeka Curtner RN, BSN [phone] patient is for possible dc today to snf.  12/01/13 1726 Letha Capeeborah Lahela Woodin RN ,BSN [phone] patient lives alone, await pt/ot eval.  Per RN , patient wants a rolling walker.

## 2013-12-01 NOTE — Progress Notes (Signed)
[facility] PROGRESS NOTE  BENYAMIN JEFF WUJ:[id number] DOB: [birth year] DOA: 11/30/2013 PCP: [name], MD  HPI/Subjective: Kevin Gould is a 68 yo male with PMH of Parkinson's disease diagnosed in 2013, tremor since 2005, and HTN.  Pt presented to ED after falling on 11/30/13.  Pt was found on the floor and was not able to stand due to weakness.  He has been falling more recently and has been having visual and auditory hallucinations for a few days.    Pt was able to communicate and talk but is confused.  Knew date and time but was unaware of how long he has been in hospital.  Pt still having urinary incontinence and is confused about procedures Neuro is planning.  Neuro is guiding therapy.  Assessment/Plan:  Rhabdomyolysis -Pt presented to the ED after falling and being on the ground for previous 4 hours.  [place] of 956[address] -Pt placed on IVF for management -Continue to monitor and check CK total in AM   Parkinson's Disease -PMH of Parkinsons -Pt has been re-started on Requip -Will continue to monitor  Frequent Falls -Secondary to gait issue and Parkinsons and hypotension -Place on IV fluids -Neuro following for suspecting NPH  Hypotension -Likely secondary to dehydration and poor oral intake -Pt started on IVF -Will continue to monitor for orthostatic  Suspected NPH -Pt is experiencing memory issues, gait abnormality, hallucinations and urine and bowel incontinence -CT of the Head showed prominent lateral horns b/l -Head MRI showed mild-moderate ventricular enlargement compatible with obstructive hydrocephalus and small amount of blood layering the left occipital horn -TSH 3.105, B12 1212 and RPR and HIV both negative -Per neurology note MRI was not consistent with NPH and recommended patient to followup with his neurologist.  Malnutrition -Secondary to poor oral intake -Pt is on regular diet and Ensure -Nutrition was consulted and recommended increasing  protein intake -Will continue to monitor  Suspected UTI -UA showed Leukocytes and bacteria present -Urine Cx show 8000 colonies/ml with insignificant growth     DVT Prophylaxis:  SQ Lovenox  Code Status: Full Code Family Communication: Spoke only with the patient. Disposition Plan: Remain inpatient.  D/c to home when appropriate   Consultants:  Neuro  Procedures:  Head CT w/o contrast  Head MRI w/o contrast  Antibiotics:  None  Objective: Filed Vitals:   11/30/13 1556 11/30/13 2152 12/01/13 0545 12/01/13 0546  BP: 108/69 103/65  95/57  Pulse: 79 83  63  Temp: 97.8 F (36.6 C) 99 F (37.2 C)  97.8 F (36.6 C)  TempSrc: Oral Oral  Oral  Resp: 17 16  16   Height: 5\' 8"  (1.727 m)     Weight: 56.7 kg (125 lb)  58.6 kg (129 lb 3 oz)   SpO2: 96% 96%  97%    Intake/Output Summary (Last 24 hours) at 12/01/13 1213 Last data filed at 12/01/13 1043  Gross per 24 hour  Intake 2779.93 ml  Output    350 ml  Net 2429.93 ml   Filed Weights   11/30/13 1556 12/01/13 0545  Weight: 56.7 kg (125 lb) 58.6 kg (129 lb 3 oz)    Exam: General: Appears stated age, appears calm and comfortable  HEENT:  PERRLA Neck: Supple, no JVD, no masses, no cervical lymphadenopathy Cardiovascular: RRR, S1 S2 auscultated, no rubs, murmurs or gallops.   Respiratory: Clear to auscultation bilaterally with equal chest rise  Abdomen: Soft, nontender, nondistended, + bowel sounds  Extremities: warm dry without cyanosis clubbing or  edema.  Neuro: Clear speech, having difficult with memory Skin: Without rashes exudates or nodules.    Data Reviewed: Basic Metabolic Panel:  Recent Labs Lab 11/30/13 1211  NA 137  K 4.0  CL 95*  CO2 27  GLUCOSE 85  BUN 33*  CREATININE 0.86  CALCIUM 9.1   CBC:  Recent Labs Lab 11/30/13 1211  WBC 12.6*  NEUTROABS 11.3*  HGB 15.4  HCT 43.0  MCV 90.9  PLT 183   Cardiac Enzymes:  Recent Labs Lab 11/30/13 1211  CKTOTAL 16109*  TROPONINI  <0.30   Recent Results (from the past 240 hour(s))  URINE CULTURE     Status: None   Collection Time    11/30/13 12:23 PM      Result Value Range Status   Specimen Description URINE, CLEAN CATCH   Final   Special Requests NONE   Final   Culture  Setup Time     Final   Value: 11/30/2013 14:18     Performed at [facility] Count     Final   Value: 8,000 COLONIES/ML     Performed at [facility]   Culture     Final   Value: INSIGNIFICANT GROWTH     Performed at [facility]   Report Status 12/01/2013 FINAL   Final     Studies: Dg Chest 2 View  11/30/2013   CLINICAL DATA:  Near syncope, weakness, and fall  EXAM: CHEST  2 VIEW  COMPARISON:  None.  FINDINGS: The lungs are mildly hyperinflated and clear. There is no pneumothorax or pleural effusion. The cardiac silhouette is normal in size. The mediastinum is normal in width. The observed portions of the bony thorax appear normal.  IMPRESSION: There is hyperinflation consistent with COPD. There is no evidence of pneumonia nor CHF nor other acute cardiopulmonary abnormality.   Electronically Signed   By: [name]  [place]   On: 11/30/2013 12:44   Ct Head Wo Contrast  11/30/2013   CLINICAL DATA:  Fall.  Weakness.  EXAM: CT HEAD WITHOUT CONTRAST  TECHNIQUE: Contiguous axial images were obtained from the base of the skull through the vertex without intravenous contrast.  COMPARISON:  None.  FINDINGS: Prominent occipital horns lateral ventricles. Prominent temporal horns of the lateral ventricles, right greater than left, with slight irregularity of the right temporal horn, which is more stellate in cross-section rather than the gently curved as on the left side.  Hypodensity in the lateral part of the transverse sinus on the right measures 1.3 x 0.7 cm, compatible with arachnoid granulation.  Borderline prominence of the third ventricle. The fourth ventricle does not appear particularly prominent. The carpus callosum appears  to be present anteriorly.  Periventricular white matter and corona radiata hypodensities favor chronic ischemic microvascular white matter disease. No intracranial hemorrhage, mass lesion, or acute CVA.  Chronic maxillary, ethmoid, and right frontal sinusitis.  IMPRESSION: 1. Prominent and somewhat irregular temporal horn of the right lateral ventricle, with slight prominence of the contralateral lateral ventricle and occipital horns. There are potential causes. One is that this patient simply has mild prominence of the lateral ventricles, and also some encephalomalacia from prior insult along the temporal horn of the right lateral ventricle. This is favored given the asymmetry. The other possibility is that the patient does indeed have mild hydrocephalus which is manifesting more prominently in the right temporal horn. MRI of the brain could be utilized to assess for more specific findings of  gliosis/encephalomalacia to help differentiate these two possibilities. 2. Chronic paranasal sinusitis. 3. 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 4. Mild chronic ischemic microvascular white matter disease.   Electronically Signed   By: Herbie BaltimoreWalt  Liebkemann M.D.   On: 11/30/2013 12:47   Mr Brain Wo Contrast  12/01/2013   CLINICAL DATA:  Fall.  Parkinson's.  Memory problems.  Abnormal CT  EXAM: MRI HEAD WITHOUT CONTRAST  TECHNIQUE: Multiplanar, multiecho pulse sequences of the brain and surrounding structures were obtained without intravenous contrast.  COMPARISON:  CT head 11/30/2013  FINDINGS: Moderate lateral ventricle enlargement, right greater than left. Asymmetric enlargement of the right temporal horn as noted on CT. The third ventricle is mildly dilated. Fourth ventricle is normal. The findings suggest obstructive hydrocephalus.  There is a very small amount of blood layering in the left occipital horn. This is most likely acute hemorrhage related to head trauma. This may be unrelated to the  hydrocephalus. No earlier scans are available to determine chronicity of the hydrocephalus however I suspected it is chronic. There is no periventricular resorption of CSF.  Asymmetric enlargement of the right temporal horn with a stellate appearance as noted on CT. 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 No evidence of prior infarct on the right causing volume loss of the right temporal lobe.  Negative for acute infarct. Mild chronic microvascular ischemic change in the left parietal white matter. No mass lesion is identified.  Prominent arachnoid granulation is noted in the right transverse sinus as noted on CT.  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 Comparison with earlier scans would be helpful if available  Small amount of blood layering in the left occipital horn, presumably related to recent head trauma. While this could be related to the hydrocephalus, the two may be unrelated.  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  No acute infarct.   Electronically Signed   By: Marlan Palauharles  Clark M.D.   On: 12/01/2013 09:22    Scheduled Meds: . enoxaparin (LOVENOX) injection  40 mg Subcutaneous Q24H  . feeding supplement (ENSURE COMPLETE)  237 mL Oral BID BM  . rOPINIRole  1 mg Oral QID  . sodium chloride  500 mL Intravenous Once  . sodium chloride  3 mL Intravenous Q12H   Continuous Infusions: . sodium chloride 125 mL/hr (12/01/13 0543)    Active Problems:   Rhabdomyolysis   Hypotension   Frequent falls   Memory problem    Horris LatinoLance McGhee PA-S [facility] Pager [phone]. If 7PM-7AM, please contact night-coverage at [email or web address], password [email or web address] 12/01/2013, 12:13 PM  LOS: 1 day   Addendum  Patient seen and examined, chart  and data base reviewed.  I agree with the above assessment and plan.  For full details please see Mr. Horris LatinoLance McGhee PA-S note.   Clint LippsMutaz A Aftyn Nott, MD [facility] Pager: [phone] 12/01/2013, 5:13 PM

## 2013-12-01 NOTE — Progress Notes (Signed)
Utilization review completed.  
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
## 2013-12-01 NOTE — Progress Notes (Signed)
Subjective: No complaints. Oriented. Alert. Has not been out of bed today.  Objective: Current vital signs: BP 103/67  Pulse 79  Temp(Src) 97.8 F (36.6 C) (Oral)  Resp 16  Ht 5\' 8"  (1.727 m)  Wt 58.6 kg (129 lb 3 oz)  BMI 19.65 kg/m2  SpO2 92% Vital signs in last 24 hours: Temp:  [97.8 F (36.6 C)-99 F (37.2 C)] 97.8 F (36.6 C) (02/05 1449) Pulse Rate:  [63-83] 79 (02/05 1449) Resp:  [16-17] 16 (02/05 1449) BP: (95-108)/(57-69) 103/67 mmHg (02/05 1449) SpO2:  [92 %-97 %] 92 % (02/05 1449) Weight:  [56.7 kg (125 lb)-58.6 kg (129 lb 3 oz)] 58.6 kg (129 lb 3 oz) (02/05 0545)  Intake/Output from previous day: 02/04 0701 - 02/05 0700 In: 2659.9 [P.O.:632; I.V.:2027.9] Out: 350 [Urine:350] Intake/Output this shift: Total I/O In: 120 [P.O.:120] Out: -  Nutritional status: General  Physical Exam  Neurologic Exam:  MENTAL STATUS: awake, alert, Language fluent Follows simple commands. Naming intact   CRANIAL NERVES: pupils equal and reactive to light, visual fields full to confrontation, extraocular muscles intact, no nystagmus, facial sensation and strength symmetric, uvula midline, shoulder shrug symmetric, tongue midline, Corneal reflex,  MOTOR: normal bulk and tone, full strength in the BUE, BLE  SENSORY: normal and symmetric to light touch  COORDINATION: finger-nose-finger with tremor bilaterally. REFLEXES: deep tendon reflexes present and symmetric - no babinski GAIT/STATION: Did not attempt ambulation.   Lab Results: Basic Metabolic Panel:  Recent Labs Lab 11/30/13 1211  NA 137  K 4.0  CL 95*  CO2 27  GLUCOSE 85  BUN 33*  CREATININE 0.86  CALCIUM 9.1    Liver Function Tests: No results found for this basename: AST, ALT, ALKPHOS, BILITOT, PROT, ALBUMIN,  in the last 168 hours No results found for this basename: LIPASE, AMYLASE,  in the last 168 hours No results found for this basename: AMMONIA,  in the last 168 hours  CBC:  Recent Labs Lab  11/30/13 1211  WBC 12.6*  NEUTROABS 11.3*  HGB 15.4  HCT 43.0  MCV 90.9  PLT 183    Cardiac Enzymes:  Recent Labs Lab 11/30/13 1211  CKTOTAL [id number]*  TROPONINI <0.30    Lipid Panel: No results found for this basename: CHOL, TRIG, HDL, CHOLHDL, VLDL, LDLCALC,  in the last 168 hours  CBG: No results found for this basename: GLUCAP,  in the last 168 hours  Microbiology: Results for orders placed during the hospital encounter of 11/30/13  URINE CULTURE     Status: None   Collection Time    11/30/13 12:23 PM      Result Value Range Status   Specimen Description URINE, CLEAN CATCH   Final   Special Requests NONE   Final   Culture  Setup Time     Final   Value: 11/30/2013 14:18     Performed at [facility]   Colony Count     Final   Value: 8,000 COLONIES/ML     [facility]formed at [facility]   Culture     Final   Value: INSIGNIFICANT GROWTH     Performed at [facility]   Report Status 12/01/2013 FINAL   Final    Coagulation Studies: No results found for this basename: LABPROT, INR,  in the last 72 hours  Imaging:  Dg Chest 2 View 11/30/2013    There is hyperinflation consistent with COPD. There is no evidence of pneumonia nor CHF nor other acute cardiopulmonary  abnormality.     Ct Head Wo Contrast 11/30/2013    1. Prominent and somewhat irregular temporal horn of the right lateral ventricle, with slight prominence of the contralateral lateral ventricle and occipital horns. There are potential causes. One is that this patient simply has mild prominence of the lateral ventricles, and also some encephalomalacia from prior insult along the temporal horn of the right lateral ventricle. This is favored given the asymmetry. The other possibility is that the patient does indeed have mild hydrocephalus which is manifesting more prominently in the right temporal horn. MRI of the brain could be utilized to assess for more specific findings of  gliosis/encephalomalacia to help differentiate these two possibilities. 2. Chronic paranasal sinusitis. 3. 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 4. Mild chronic ischemic microvascular white matter disease.     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 Comparison with earlier scans would be helpful if available  Small amount of blood layering in the left occipital horn, presumably related to recent head trauma. While this could be related to the hydrocephalus, the two may be unrelated.  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  No acute infarct.        Medications:  Current Facility-Administered Medications  Medication Dose Route Frequency Provider Last Rate Last Dose  . 0.9 %  sodium chloride infusion   Intravenous Continuous Belkys A Regalado, MD 125 mL/hr at 12/01/13 0543 125 mL/hr at 12/01/13 0543  . acetaminophen (TYLENOL) tablet 650 mg  650 mg Oral Q6H PRN Belkys A Regalado, MD       Or  . acetaminophen (TYLENOL) suppository 650 mg  650 mg Rectal Q6H PRN Belkys A Regalado, MD      . enoxaparin (LOVENOX) injection 40 mg  40 mg Subcutaneous Q24H Belkys A Regalado, MD   40 mg at 11/30/13 1738  . feeding supplement (ENSURE COMPLETE) (ENSURE COMPLETE) liquid 237 mL  237 mL Oral BID BM Belkys A Regalado, MD   237 mL at 12/01/13 1127  . HYDROcodone-acetaminophen (NORCO/VICODIN) 5-325 MG per tablet 1-2 tablet  1-2 tablet Oral Q4H PRN Belkys A Regalado, MD      . ondansetron (ZOFRAN) tablet 4 mg  4 mg Oral Q6H PRN Belkys A Regalado, MD       Or  . ondansetron (ZOFRAN) injection 4 mg  4 mg Intravenous Q6H PRN Belkys A Regalado, MD      . rOPINIRole (REQUIP) tablet 1 mg  1 mg Oral QID Ritta Slot, MD   1 mg at 12/01/13 1403  . senna-docusate (Senokot-S) tablet 1  tablet  1 tablet Oral QHS PRN Belkys A Regalado, MD      . sodium chloride 0.9 % bolus 500 mL  500 mL Intravenous Once Laray Anger, DO      . sodium chloride 0.9 % injection 3 mL  3 mL Intravenous Q12H Belkys A Regalado, MD   3 mL at 11/30/13 2215     Assessment/Plan: 67 y.o. male with a history of parkinsons' disease who was diagnosed in 2013, but has had some tremor since approximately 2005. He states that he has had some gait instability since about 2012. His friend states that for the past year, he has had problems staying vertical. He tends to lean when talking or standing upright. The patient has  had some problems with visual hallucinations. He has had conversations and is not always aware that the hallucinations are not real. He has had increasing problems with his memory over the past few months.His friend states that he seems to get confused about dates and times. He was seen in [facility] several times in 2013 which is where he was diagnosed.   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 Per Dr. Roseanne Reno MRI is not consistent with normal pressure hydrocephalus. The plan is for the patient to followup with his neurologist at [facility].   Delton See PA-C [facility] Pager [phone] 12/01/2013, 3:41 PM

## 2013-12-02 LAB — CBC
HCT: 36 % — ABNORMAL LOW (ref 39.0–52.0)
Hemoglobin: 12.2 g/dL — ABNORMAL LOW (ref 13.0–17.0)
MCH: 31.6 pg (ref 26.0–34.0)
MCHC: 33.9 g/dL (ref 30.0–36.0)
MCV: 93.3 fL (ref 78.0–100.0)
PLATELETS: 137 10*3/uL — AB (ref 150–400)
RBC: 3.86 MIL/uL — AB (ref 4.22–5.81)
RDW: 13.1 % (ref 11.5–15.5)
WBC: 6.2 10*3/uL (ref 4.0–10.5)

## 2013-12-02 LAB — BASIC METABOLIC PANEL
BUN: 23 mg/dL (ref 6–23)
CALCIUM: 8.4 mg/dL (ref 8.4–10.5)
CO2: 27 mEq/L (ref 19–32)
CREATININE: 0.76 mg/dL (ref 0.50–1.35)
Chloride: 107 mEq/L (ref 96–112)
GFR calc non Af Amer: >90 mL/min (ref >90)
Glucose, Bld: 93 mg/dL (ref 70–99)
Potassium: 3.6 mEq/L — ABNORMAL LOW (ref 3.7–5.3)
Sodium: 143 mEq/L (ref 137–147)

## 2013-12-02 LAB — CK: CK TOTAL: 5117 U/L — AB (ref 7–232)

## 2013-12-02 MED ORDER — POTASSIUM CHLORIDE CRYS ER 20 MEQ PO TBCR
40.0000 meq | EXTENDED_RELEASE_TABLET | Freq: Once | ORAL | Status: AC
  Administered 2013-12-02: 40 meq via ORAL
  Filled 2013-12-02 (×3): qty 2

## 2013-12-02 NOTE — Clinical Social Work Psychosocial (Signed)
Clinical Social Work Department BRIEF PSYCHOSOCIAL ASSESSMENT 12/02/2013  Patient:  Kevin Gould, Kevin Gould     Account Number:  [id number]     Admit date:  11/30/2013  Clinical Social Worker:  Lovey Newcomer  Date/Time:  12/02/2013 03:38 PM  Referred by:  Physician  Date Referred:  12/02/2013 Referred for  SNF Placement   Other Referral:   Interview type:  Patient Other interview type:   Patient alert and oriented at time of assessment.    PSYCHOSOCIAL DATA Living Status:  ALONE Admitted from facility:   Level of care:   Primary support name:  Sonia Baller Primary support relationship to patient:  FRIEND Degree of support available:   Patient has two close friends here in [place] for support.    CURRENT CONCERNS Current Concerns  Post-Acute Placement   Other Concerns:    SOCIAL WORK ASSESSMENT / PLAN CSW met with patient at bedside to discuss recommendation for SNF. Patient is agreeable to SNF placement in [place]. Patient is worried about his copays for SNF. CSW explained that patient would need to contact his insurance company to get true copay figures, but CSW explained that with past patient's the copay has been around $25 a day for the first 20 days. Patient states that he plans to return home after rehab.   Assessment/plan status:  Psychosocial Support/Ongoing Assessment of Needs Other assessment/ plan:   Complete Fl2, Fax, PASRR   Information/referral to community resources:   CSW contact information and SNF list given to patient.    PATIENT'S/FAMILY'S RESPONSE TO PLAN OF CARE: Patient is agreeable to SN[place]. Patient was pleasant, appropriate, and appreciative of CSW contact. Patient waits for bed offers. CSW will assist with DC.       Liz Beach, [place], [place], [phone]

## 2013-12-02 NOTE — Evaluation (Signed)
Physical Therapy Evaluation Patient Details Name: Kevin Gould MRN: [id number] DOB: 01/30/1946 Today's Date: 12/02/2013 Time: [id number] PT Time Calculation (min): 16 min  PT Assessment / Plan / Recommendation History of Present Illness  Kevin Gould is a 68 yo male with PMH of Parkinson's disease diagnosed in 2013, tremor since 2005, and HTN.  Pt presented to ED after falling on 11/30/13.  Pt was found on the floor and was not able to stand due to weakness.  He has been falling more recently and has been having visual and auditory hallucinations for a few days.  Pt with UIT.  Clinical Impression  Pt admitted with above. Pt currently with functional limitations due to the deficits listed below (see PT Problem List).  Pt will benefit from skilled PT to increase their independence and safety with mobility to allow discharge to the venue listed below.       PT Assessment  Patient needs continued PT services    Follow Up Recommendations  SNF    Does the patient have the potential to tolerate intense rehabilitation      Barriers to Discharge Decreased caregiver support      Equipment Recommendations  Rolling walker with 5" wheels    Recommendations for Other Services     Frequency Min 2X/week    Precautions / Restrictions Precautions Precautions: Fall   Pertinent Vitals/Pain VSS      Mobility  Bed Mobility Overal bed mobility: Needs Assistance Bed Mobility: Supine to Sit Supine to sit: Mod assist General bed mobility comments: Assist to bring legs off of bed and raise trunk. Transfers Overall transfer level: Needs assistance Equipment used: Rolling walker (2 wheeled) Transfers: Sit to/from [employer]Stand;Stand Pivot Transfers Sit to Stand: +2 physical assistance;Mod assist Stand pivot transfers: +2 physical assistance;Mod assist General transfer comment: Assist to bring hips up.  Pt with flexed posture and posterior lean. Ambulation/Gait Ambulation/Gait assistance: Mod  assist;+2 safety/equipment Ambulation Distance (Feet): 10 Feet Assistive device: Rolling walker (2 wheeled) Gait Pattern/deviations: Step-through pattern;Decreased step length - right;Decreased step length - left;Shuffle;Trunk flexed;Narrow base of support Gait velocity interpretation: Below normal speed for age/gender General Gait Details: Verbal cues to stand more erect and to seperate feet.    Exercises     PT Diagnosis: Difficulty walking;Generalized weakness  PT Problem List: Decreased strength;Decreased activity tolerance;Decreased balance;Decreased mobility;Decreased knowledge of use of DME PT Treatment Interventions: DME instruction;Gait training;Functional mobility training;Therapeutic activities;Therapeutic exercise;Balance training;Patient/family education     PT Goals(Current goals can be found in the care plan section) Acute Rehab PT Goals Patient Stated Goal: Get stronger PT Goal Formulation: With patient Time For Goal Achievement: 12/09/13 Potential to Achieve Goals: Good  Visit Information  Last PT Received On: 12/02/13 Assistance Needed: +1 History of Present Illness: Kevin Gould is a 68 yo male with PMH of Parkinson's disease diagnosed in 2013, tremor since 2005, and HTN.  Pt presented to ED after falling on 11/30/13.  Pt was found on the floor and was not able to stand due to weakness.  He has been falling more recently and has been having visual and auditory hallucinations for a few days.  Pt with UIT.       Prior Functioning  Home Living Family/patient expects to be discharged to:: Skilled nursing facility Living Arrangements: Alone Available Help at Discharge: Friend(s);Available PRN/intermittently Home Equipment: None Prior Function Level of Independence: Independent Comments: Pt with falls at home. Communication Communication: No difficulties    Cognition  Cognition Arousal/Alertness: Awake/alert Behavior During Therapy:  WFL for tasks  assessed/performed Overall Cognitive Status: Within Functional Limits for tasks assessed    Extremity/Trunk Assessment Upper Extremity Assessment Upper Extremity Assessment: Defer to OT evaluation Lower Extremity Assessment Lower Extremity Assessment: Generalized weakness   Balance Balance Overall balance assessment: Needs assistance Sitting-balance support: No upper extremity supported Sitting balance-Leahy Scale: Poor Postural control: Left lateral lean Standing balance support: Bilateral upper extremity supported Standing balance-Leahy Scale: Poor  End of Session PT - End of Session Equipment Utilized During Treatment: Gait belt Activity Tolerance: Patient tolerated treatment well Patient left: in chair;with chair alarm set;with call bell/phone within reach Nurse Communication: Mobility status  GP     [facility] 12/02/2013, 10:23 AM  Skip Mayer PT [phone]

## 2013-12-02 NOTE — Discharge Summary (Signed)
Physician Discharge Summary  Kevin Gould ZOX:[id number] DOB: Apr 19, 1946 DOA: 11/30/2013  PCP: [name], MD  Admit date: 11/30/2013 Discharge date: 12/02/2013  Time spent: 40 minutes  Recommendations for Outpatient Follow-up:  1. Discharge to SNF for PT rehab 2. Follow-up with Neurologist for obstructive hydrocephalus  Discharge Diagnoses:  Active Problems:   Rhabdomyolysis   Hypotension   Frequent falls   Memory problem   Discharge Condition: Stable  Diet recommendation: Regular diet with increased protein intake  Filed Weights   12/01/13 0545 12/02/13 0418 12/02/13 0540  Weight: 58.6 kg (129 lb 3 oz) 58.1 kg (128 lb 1.4 oz) 60 kg (132 lb 4.4 oz)    History of present illness:  Kevin Gould in a 68 yo male with PMH for Parkinson's, Tremor since 2005, HTN and arthritis who presented to the ED following a fall.  Pt called EMS after falling and was so weak he was unable to get up.  Pt was on the floor for 4 hours.  Pt had fallen multiple times over previous 2 days, experiencing visual and auditory hallucination, urinary and bowel incontinence and difficulty with memory.    Pt was better today.  Pt has no complaints about N/V or diarrhea.  Pt is still mildly confuses but is able to hold conversation better than previous day.  MRI results show obstructive hydrocephalus, Neurology wants him to follow-up with regular neurologist following d/c.  Hospital Course:   Rhabdomyolysis  -Pt presented to the ED after falling and being on the ground for previous 4 hours. [place] 81191  -Pt placed on IVF for management  -Pt has a total CK of 5117 on 12/02/13 and is clinically improving, encourage oral hydration -No evidence of renal involvement, creatinine is 0.76 in the time of discharge. Pt is stable and ready for d/c  Parkinson's Disease  -PMH of Parkinsons  -Pt has been re-started on Requip on 2/5  Frequent Falls  -Secondary to gait issue and Parkinsons and hypotension   -Placed on IV fluids  -Neuro ran MRI for suspected NPH, MRI results show obstructive hydrocephalus -Neurology recommended followup as outpatient for further testing and management. -D/c to SNF for PT and rehab -Pt is stable and ready for d/c  Hypotension  -Likely secondary to dehydration and poor oral intake  -Pt started on IVF and is not hypotensive currently -Current BP on 2/6 is 152/83 -Pt is stable and ready for d/c  Altered MS -Pt is experiencing memory issues, gait abnormality, hallucinations and urine and bowel incontinence  -CT of the Head showed prominent lateral horns bilaterally. 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 -MRI also showed small amount of blood layering the left occipital horn likely from trauma  -TSH 3.105, B12 1212 and RPR and HIV both negative  -Per neurology note MRI was not consistent with NPH and recommended patient to followup with his neurologist.  -Pt is stable and ready for d/c  Malnutrition  -Secondary to poor oral intake  -Pt is on regular diet and Ensure  -Nutrition was consulted and recommended increasing protein intake in diet -Pt is stable and ready for d/c   Suspected UTI  -UA showed Leukocytes and bacteria present  -Urine Cx showed 8000 colonies/ml with insignificant growth and pt is asymptomatic -No antibiotics indicated.  Procedures:  CT of head w/o contrast  MRI of brain w/o contrast  Consultations:  Neurology  Discharge Exam: Filed Vitals:   12/02/13 0550  BP:  152/83  Pulse: 79  Temp:   Resp: 20   General: Appears stated age, appears calm and comfortable  HEENT: PERRLA  Neck: Supple, no JVD, no masses, no cervical lymphadenopathy  Cardiovascular: RRR, S1 S2 auscultated, no rubs, murmurs or gallops.  Respiratory: Clear to auscultation bilaterally with equal chest rise  Abdomen: Soft, nontender, nondistended, + bowel sounds   Extremities: warm dry without cyanosis clubbing or edema.  Sensation intact in upper and lower extremities Neuro: Clear speech, having difficult with memory, follows simple commands.  Naming intact  Skin: Without rashes exudates or nodules.    Discharge Instructions     Medication List    ASK your doctor about these medications       ibuprofen 200 MG tablet  Commonly known as:  ADVIL,MOTRIN  Take 400-600 mg by mouth every 6 (six) hours as needed for moderate pain.     naproxen sodium 220 MG tablet  Commonly known as:  ANAPROX  Take 440 mg by mouth daily as needed (pain).     rOPINIRole 1 MG tablet  Commonly known as:  REQUIP  Take 1 mg by mouth 4 (four) times daily.       No Known Allergies    The results of significant diagnostics from this hospitalization (including imaging, microbiology, ancillary and laboratory) are listed below for reference.    Significant Diagnostic Studies: Dg Chest 2 View  11/30/2013   CLINICAL DATA:  Near syncope, weakness, and fall  EXAM: CHEST  2 VIEW  COMPARISON:  None.  FINDINGS: The lungs are mildly hyperinflated and clear. There is no pneumothorax or pleural effusion. The cardiac silhouette is normal in size. The mediastinum is normal in width. The observed portions of the bony thorax appear normal.  IMPRESSION: There is hyperinflation consistent with COPD. There is no evidence of pneumonia nor CHF nor other acute cardiopulmonary abnormality.   Electronically Signed   By: [name]  [place]   On: 11/30/2013 12:44   Ct Head Wo Contrast  11/30/2013   CLINICAL DATA:  Fall.  Weakness.  EXAM: CT HEAD WITHOUT CONTRAST  TECHNIQUE: Contiguous axial images were obtained from the base of the skull through the vertex without intravenous contrast.  COMPARISON:  None.  FINDINGS: Prominent occipital horns lateral ventricles. Prominent temporal horns of the lateral ventricles, right greater than left, with slight irregularity of the right temporal horn, which is  more stellate in cross-section rather than the gently curved as on the left side.  Hypodensity in the lateral part of the transverse sinus on the right measures 1.3 x 0.7 cm, compatible with arachnoid granulation.  Borderline prominence of the third ventricle. The fourth ventricle does not appear particularly prominent. The carpus callosum appears to be present anteriorly.  Periventricular white matter and corona radiata hypodensities favor chronic ischemic microvascular white matter disease. No intracranial hemorrhage, mass lesion, or acute CVA.  Chronic maxillary, ethmoid, and right frontal sinusitis.  IMPRESSION: 1. Prominent and somewhat irregular temporal horn of the right lateral ventricle, with slight prominence of the contralateral lateral ventricle and occipital horns. There are potential causes. One is that this patient simply has mild prominence of the lateral ventricles, and also some encephalomalacia from prior insult along the temporal horn of the right lateral ventricle. This is favored given the asymmetry. The other possibility is that the patient does indeed have mild hydrocephalus which is manifesting more prominently in the right temporal horn. MRI of the brain could be utilized to assess for more specific findings  of gliosis/encephalomalacia to help differentiate these two possibilities. 2. Chronic paranasal sinusitis. 3. 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 4. Mild chronic ischemic microvascular white matter disease.   Electronically Signed   By: Herbie BaltimoreWalt  Liebkemann M.D.   On: 11/30/2013 12:47   Mr Brain Wo Contrast  12/01/2013   CLINICAL DATA:  Fall.  Parkinson's.  Memory problems.  Abnormal CT  EXAM: MRI HEAD WITHOUT CONTRAST  TECHNIQUE: Multiplanar, multiecho pulse sequences of the brain and surrounding structures were obtained without intravenous contrast.  COMPARISON:  CT head 11/30/2013  FINDINGS: Moderate lateral ventricle enlargement, right greater than left.  Asymmetric enlargement of the right temporal horn as noted on CT. The third ventricle is mildly dilated. Fourth ventricle is normal. The findings suggest obstructive hydrocephalus.  There is a very small amount of blood layering in the left occipital horn. This is most likely acute hemorrhage related to head trauma. This may be unrelated to the hydrocephalus. No earlier scans are available to determine chronicity of the hydrocephalus however I suspected it is chronic. There is no periventricular resorption of CSF.  Asymmetric enlargement of the right temporal horn with a stellate appearance as noted on CT. 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 No evidence of prior infarct on the right causing volume loss of the right temporal lobe.  Negative for acute infarct. Mild chronic microvascular ischemic change in the left parietal white matter. No mass lesion is identified.  Prominent arachnoid granulation is noted in the right transverse sinus as noted on CT.  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 Comparison with earlier scans would be helpful if available  Small amount of blood layering in the left occipital horn, presumably related to recent head trauma. While this could be related to the hydrocephalus, the two may be unrelated.  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  No acute infarct.   Electronically Signed   By: Marlan Palauharles  Clark M.D.   On: 12/01/2013 09:22    Microbiology: Recent Results (from the past 240 hour(s))  URINE CULTURE     Status: None   Collection Time    11/30/13 12:23 PM      Result Value Range Status   Specimen Description URINE, CLEAN CATCH   Final   Special Requests NONE   Final   Culture  Setup Time     Final    Value: 11/30/2013 14:18     Performed at [facility]   Colony Count     Final   Value: 8,000 COLONIES/ML     Performed at [facility]   Culture     Final   Value: INSIGNIFICANT GROWTH     Performed at [facility]   Report Status 12/01/2013 FINAL   Final     Labs: Basic Metabolic Panel:  Recent Labs Lab 11/30/13 1211 12/02/13 0537  NA 137 143  K 4.0 3.6*  CL 95* 107  CO2 27 27  GLUCOSE 85 93  BUN 33* 23  CREATININE 0.86 0.76  CALCIUM 9.1 8.4   CBC:  Recent Labs Lab 11/30/13 1211 12/02/13 0537  WBC 12.6* 6.2  NEUTROABS 11.3*  --   HGB 15.4 12.2*  HCT 43.0 36.0*  MCV 90.9 93.3  PLT 183 137*   Cardiac Enzymes:  Recent  Labs Lab 11/30/13 1211 12/02/13 0537  CKTOTAL 16109* 5117*  TROPONINI <0.30  --     Signed:  Horris Latino PA-S  [facility] 12/02/2013, 10:27 AM   Addendum  Patient seen and examined, chart and data base reviewed.  I agree with the above assessment and plan.  For full details please see Mr. Horris Latino PA-S note.   Clint Lipps, MD [facility] Pager: [phone] 12/02/2013, 10:56 AM

## 2013-12-02 NOTE — Clinical Social Work Placement (Signed)
Clinical Social Work Department CLINICAL SOCIAL WORK PLACEMENT NOTE 12/02/2013  Patient:  Kevin Gould,[name]  Account Number:  [id number] Admit date:  11/30/2013  Clinical Social Worker:  Lavell LusterJOSEPH BRYANT Nezzie Manera, LCSWA  Date/time:  12/02/2013 03:53 PM  Clinical Social Work is seeking post-discharge placement for this patient at the following level of care:   SKILLED NURSING   (*CSW will update this form in Epic as items are completed)   12/02/2013  Patient/family provided with [facility] Department of Clinical Social Work's list of facilities offering this level of care within the geographic area requested by the patient (or if unable, by the patient's family).  12/02/2013  Patient/family informed of their freedom to choose among providers that offer the needed level of care, that participate in Medicare, Medicaid or managed care program needed by the patient, have an available bed and are willing to accept the patient.  12/02/2013  Patient/family informed of [facility]' ownership interest in [facility], as well as of the fact that they are under no obligation to receive care at this facility.  PASARR submitted to EDS on 12/02/2013 PASARR number received from EDS on 12/02/2013  FL2 transmitted to all facilities in geographic area requested by pt/family on  12/02/2013 FL2 transmitted to all facilities within larger geographic area on   Patient informed that his/her managed care company has contracts with or will negotiate with  certain facilities, including the following:     Patient/family informed of bed offers received:  12/02/2013 [facility] Physician recommends and patient chooses bed at    [facility] on  12/02/2013 Patient to be transferred to facility by Ambulance  The following physician request were entered in Epic:   Additional Comments: Per MD [facility]  RN, patient, and facility notified of DC. Number for report left on DC packet and evening ambulance number left on packet. DC packet left on chart. Ambulance transport requested for patient. CSW signing off.   Roddie McBryant Cyra Spader, MehanLCSWA, [facility], [phone]

## 2013-12-16 ENCOUNTER — Encounter: Payer: Self-pay | Admitting: Neurology

## 2013-12-27 ENCOUNTER — Encounter: Payer: Self-pay | Admitting: Neurology

## 2013-12-27 ENCOUNTER — Ambulatory Visit (INDEPENDENT_AMBULATORY_CARE_PROVIDER_SITE_OTHER): Payer: PRIVATE HEALTH INSURANCE | Admitting: Neurology

## 2013-12-27 VITALS — BP 140/92 | HR 68 | Resp 14 | Ht 67.0 in | Wt 141.0 lb

## 2013-12-27 DIAGNOSIS — G20 Parkinson's disease: Secondary | ICD-10-CM

## 2013-12-27 DIAGNOSIS — G919 Hydrocephalus, unspecified: Secondary | ICD-10-CM

## 2013-12-27 DIAGNOSIS — G911 Obstructive hydrocephalus: Secondary | ICD-10-CM

## 2013-12-27 DIAGNOSIS — K117 Disturbances of salivary secretion: Secondary | ICD-10-CM

## 2013-12-27 MED ORDER — CARBIDOPA-LEVODOPA 25-100 MG PO TABS: 2.0000 | ORAL_TABLET | Freq: Three times a day (TID) | ORAL | Status: DC

## 2013-12-27 MED ORDER — ROPINIROLE HCL 1 MG PO TABS: 1.0000 mg | ORAL_TABLET | Freq: Four times a day (QID) | ORAL | Status: DC

## 2013-12-27 MED ORDER — AMBULATORY NON FORMULARY MEDICATION: 1.0000 | Freq: Every day | Status: DC

## 2013-12-27 NOTE — Patient Instructions (Signed)
1.  Start carbidopa/levodopa 25/100 as follows:  1/2 tab three times a day before meals x 1 wk, then 1/2 in am & noon & 1 in evening for a week, then 1/2 in am &1 at noon &one in evening for a week, then 1 tablet three times a day before meals  
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
## 2014-01-25 ENCOUNTER — Telehealth: Payer: Self-pay | Admitting: Neurology

## 2014-01-25 NOTE — Telephone Encounter (Signed)
Pt called requesting to speak to a nurse regarding his medications.  Please call pt at [phone]

## 2014-01-25 NOTE — Telephone Encounter (Signed)
Spoke with patient. He has not started his Carbidopa Levodopa yet. He was going to start, but the titration requires him to cut the pills in half and he is unable to do this with his Parkinson's and he states he does not have someone who can come help him. Please advise if he can titrate up without cutting pills in half.

## 2014-01-26 NOTE — Telephone Encounter (Signed)
Called back and left message on machine with instructions per patient's request. He is to call back with any questions.

## 2014-01-26 NOTE — Telephone Encounter (Signed)
Just start 1 pill bid for a week and then 1 pill tid prior to meals.

## 2014-03-21 ENCOUNTER — Ambulatory Visit: Payer: PRIVATE HEALTH INSURANCE | Admitting: Neurology

## 2014-04-24 ENCOUNTER — Ambulatory Visit: Payer: PRIVATE HEALTH INSURANCE | Admitting: Neurology

## 2014-06-12 ENCOUNTER — Telehealth: Payer: Self-pay | Admitting: Neurology

## 2014-06-12 NOTE — Telephone Encounter (Signed)
Thanks

## 2014-06-12 NOTE — Telephone Encounter (Signed)
Pt called wanting to let you know that he was laid off due to his Parkinson's and does not have the Funds to come in and pay his copay. He will R/S to October. He wanted to give an update of his meds. His update: His meds are working just fine for him.  [phone]

## 2014-06-12 NOTE — Telephone Encounter (Signed)
Can we see if he qualifies for [facility] patient assist?  We can likely work out copays for billing if necessary but would like to see him back.

## 2014-06-12 NOTE — Telephone Encounter (Signed)
Kevin Gould or Kevin Gould - How do we get the patient into contact with the [facility] Patient Assist Program?

## 2014-06-12 NOTE — Telephone Encounter (Signed)
FYI

## 2014-06-12 NOTE — Telephone Encounter (Signed)
Jade we can getLesly Rubenstein the forms in the mail to him. I will mail them out today thanks Annabelle Harmanana

## 2014-06-19 ENCOUNTER — Ambulatory Visit: Payer: PRIVATE HEALTH INSURANCE | Admitting: Neurology

## 2014-08-14 ENCOUNTER — Ambulatory Visit (INDEPENDENT_AMBULATORY_CARE_PROVIDER_SITE_OTHER): Payer: PRIVATE HEALTH INSURANCE | Admitting: Neurology

## 2014-08-14 ENCOUNTER — Encounter: Payer: Self-pay | Admitting: Neurology

## 2014-08-14 VITALS — BP 136/80 | HR 80 | Ht 65.0 in | Wt 156.0 lb

## 2014-08-14 DIAGNOSIS — G911 Obstructive hydrocephalus: Secondary | ICD-10-CM

## 2014-08-14 DIAGNOSIS — G20 Parkinson's disease: Secondary | ICD-10-CM

## 2014-08-14 DIAGNOSIS — G919 Hydrocephalus, unspecified: Secondary | ICD-10-CM

## 2014-08-14 MED ORDER — CARBIDOPA-LEVODOPA ER 50-200 MG PO TBCR: 1.0000 | EXTENDED_RELEASE_TABLET | Freq: Every day | ORAL | Status: DC

## 2014-08-14 MED ORDER — CARBIDOPA-LEVODOPA 25-100 MG PO TABS: 1.0000 | ORAL_TABLET | Freq: Four times a day (QID) | ORAL | Status: DC
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
## 2014-08-14 NOTE — Patient Instructions (Signed)
1.  Take carbidopa/levodopa 25/100 four times per day, spread out every 4 hours or so 2.  Take carbidopa/levodopa 50/200 at bedtime

## 2014-08-15 ENCOUNTER — Telehealth: Payer: Self-pay | Admitting: Neurology

## 2014-08-15 MED ORDER — CARBIDOPA-LEVODOPA ER 50-200 MG PO TBCR: 1.0000 | EXTENDED_RELEASE_TABLET | Freq: Every day | ORAL | Status: DC

## 2014-08-15 MED ORDER — CARBIDOPA-LEVODOPA 25-100 MG PO TABS: 1.0000 | ORAL_TABLET | Freq: Four times a day (QID) | ORAL | Status: DC

## 2014-08-15 NOTE — Telephone Encounter (Signed)
Patient called back and gave me an alternate fax number of [phone] which also does not work. I tried to call him back several times to make him aware but his phone just has a disconnected tone.

## 2014-08-15 NOTE — Telephone Encounter (Signed)
RX for Levodopa 25/100 and 50/200 signed by Dr Allena KatzPatel and [facility] to [facility] but number provided is not working. Left message on machine for patient to call back and provide an accurate fax number.

## 2014-08-15 NOTE — Telephone Encounter (Signed)
Pt would like us to fax over a rx to the [facility] at fax number [phone] attn Dr Carmie KannerSalmon it for his parkinson Drug name is carbidopa levodpa  His phone number is [phone]

## 2014-08-16 NOTE — Telephone Encounter (Signed)
Tried to call again several times and the number just has a busy tone. If patient calls back he will just have to take his prescription that was printed and given to him at his visit to the [facility] because the fax numbers are not functional.

## 2014-10-16 ENCOUNTER — Ambulatory Visit: Payer: PRIVATE HEALTH INSURANCE | Admitting: Neurology

## 2014-11-06 ENCOUNTER — Telehealth: Payer: Self-pay | Admitting: Neurology

## 2014-11-06 NOTE — Telephone Encounter (Signed)
Pt called and resch appt from 11-20-14 to 12-25-14

## 2014-11-20 ENCOUNTER — Ambulatory Visit: Payer: PRIVATE HEALTH INSURANCE | Admitting: Neurology

## 2014-12-14 ENCOUNTER — Other Ambulatory Visit: Payer: Self-pay | Admitting: *Deleted

## 2014-12-14 MED ORDER — CARBIDOPA-LEVODOPA ER 50-200 MG PO TBCR: 1.0000 | EXTENDED_RELEASE_TABLET | Freq: Every day | ORAL | Status: DC

## 2014-12-14 NOTE — Telephone Encounter (Signed)
Levodopa 50/200 refill requested. Per last office note- patient to remain on medication. Refill approved and sent to patient's pharmacy.   

## 2014-12-25 ENCOUNTER — Ambulatory Visit (INDEPENDENT_AMBULATORY_CARE_PROVIDER_SITE_OTHER): Payer: Medicare Other | Admitting: Neurology

## 2014-12-25 ENCOUNTER — Encounter: Payer: Self-pay | Admitting: Neurology

## 2014-12-25 VITALS — BP 138/90 | HR 80 | Ht 66.0 in | Wt 161.0 lb

## 2014-12-25 DIAGNOSIS — G20 Parkinson's disease: Secondary | ICD-10-CM

## 2014-12-25 DIAGNOSIS — G911 Obstructive hydrocephalus: Secondary | ICD-10-CM

## 2014-12-25 DIAGNOSIS — G919 Hydrocephalus, unspecified: Secondary | ICD-10-CM | POA: Insufficient documentation

## 2014-12-25 MED ORDER — CARBIDOPA-LEVODOPA 25-100 MG PO TABS: ORAL_TABLET | ORAL | Status: DC

## 2014-12-25 NOTE — Patient Instructions (Signed)
Increase Carbidopa Levodopa 25/100 to 2 tablets in the morning, 1 tablet midday, 2 tablets in the afternoon, 1 in the evening.  Continue Carbidopa Levodopa 50/200 at night.
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
## 2015-04-02 ENCOUNTER — Ambulatory Visit: Payer: Medicare Other | Admitting: Neurology

## 2015-05-21 ENCOUNTER — Ambulatory Visit: Payer: Medicare Other | Admitting: Neurology

## 2015-06-26 ENCOUNTER — Encounter: Payer: Self-pay | Admitting: Neurology

## 2015-06-26 ENCOUNTER — Ambulatory Visit (INDEPENDENT_AMBULATORY_CARE_PROVIDER_SITE_OTHER): Payer: Medicare Other | Admitting: Neurology

## 2015-06-26 VITALS — BP 142/82 | HR 97 | Ht 66.0 in | Wt 158.0 lb

## 2015-06-26 DIAGNOSIS — K117 Disturbances of salivary secretion: Secondary | ICD-10-CM

## 2015-06-26 DIAGNOSIS — G919 Hydrocephalus, unspecified: Secondary | ICD-10-CM

## 2015-06-26 DIAGNOSIS — G20 Parkinson's disease: Secondary | ICD-10-CM

## 2015-06-26 DIAGNOSIS — G911 Obstructive hydrocephalus: Secondary | ICD-10-CM | POA: Diagnosis not present

## 2015-06-26 MED ORDER — CARBIDOPA-LEVODOPA ER 50-200 MG PO TBCR: 1.0000 | EXTENDED_RELEASE_TABLET | Freq: Every day | ORAL | Status: DC

## 2015-06-26 MED ORDER — CARBIDOPA-LEVODOPA 25-100 MG PO TABS: ORAL_TABLET | ORAL | Status: DC

## 2015-06-26 NOTE — Patient Instructions (Signed)
1.  Take carbidopa/levodopa 25/100, 2 tablets at 7am/11am/3pm and 1 tablet at 7pm 2.  Take carbidopa/levodopa 50/200 CR at bedtime 3.  Take the physical therapy RX to the [facility] and have them put that order in and see if they will honor it 4.  I will get you set up for a consultation with the neurosurgeon, Dr. [name], as we talked about.
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
## 2015-06-27 ENCOUNTER — Telehealth: Payer: Self-pay | Admitting: Neurology

## 2015-06-27 NOTE — Telephone Encounter (Signed)
Referral faxed to [facility] at [phone] with confirmation received. They will contact patient with appt.

## 2015-06-28 ENCOUNTER — Telehealth: Payer: Self-pay | Admitting: *Deleted

## 2015-06-28 NOTE — Telephone Encounter (Signed)
Patient not ready to move forward with seeing a surgeon please hold off on this referral  Call back number [phone]

## 2015-06-28 NOTE — Telephone Encounter (Signed)
FYI - Dr. Tat 

## 2015-10-30 ENCOUNTER — Ambulatory Visit: Payer: Medicare Other | Admitting: Neurology

## 2015-11-27 ENCOUNTER — Ambulatory Visit: Payer: Medicare Other | Admitting: Neurology

## 2015-11-27 ENCOUNTER — Telehealth: Payer: Self-pay | Admitting: Neurology

## 2015-11-27 DIAGNOSIS — G20 Parkinson's disease: Secondary | ICD-10-CM

## 2015-11-27 MED ORDER — CARBIDOPA-LEVODOPA 25-100 MG PO TABS: ORAL_TABLET | ORAL | Status: DC

## 2015-11-27 MED ORDER — CARBIDOPA-LEVODOPA ER 50-200 MG PO TBCR: 1.0000 | EXTENDED_RELEASE_TABLET | Freq: Every day | ORAL | Status: DC

## 2015-11-27 NOTE — Telephone Encounter (Signed)
Patient did not reschedule his appt yet from today. Asking for refill of Carbidopa Levodopa - please advise.

## 2015-11-27 NOTE — Telephone Encounter (Signed)
30 day supply only.  Needs appt (n/s today)

## 2015-11-27 NOTE — Telephone Encounter (Signed)
PT canceled appointment today but need a refill on medication/Dawn CB# [phone]

## 2015-11-27 NOTE — Telephone Encounter (Signed)
One month supply sent to pharmacy

## 2015-12-04 ENCOUNTER — Ambulatory Visit (INDEPENDENT_AMBULATORY_CARE_PROVIDER_SITE_OTHER): Payer: Medicare Other | Admitting: Neurology

## 2015-12-04 ENCOUNTER — Encounter: Payer: Self-pay | Admitting: Neurology

## 2015-12-04 VITALS — BP 138/84 | HR 68 | Ht 66.0 in | Wt 147.0 lb

## 2015-12-04 DIAGNOSIS — G911 Obstructive hydrocephalus: Secondary | ICD-10-CM

## 2015-12-04 DIAGNOSIS — G20 Parkinson's disease: Secondary | ICD-10-CM

## 2015-12-04 DIAGNOSIS — G919 Hydrocephalus, unspecified: Secondary | ICD-10-CM
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
## 2016-01-22 ENCOUNTER — Telehealth: Payer: Self-pay | Admitting: Neurology

## 2016-01-22 ENCOUNTER — Encounter: Payer: Self-pay | Admitting: Neurology

## 2016-01-22 NOTE — Telephone Encounter (Signed)
Letter completed.

## 2016-01-22 NOTE — Telephone Encounter (Signed)
Patient called. He received a summon for [employer] and needs a letter of excuse. He would like this mailed to his house. Okay to write?

## 2016-01-23 NOTE — Telephone Encounter (Signed)
Letter mailed to patient.

## 2016-06-02 ENCOUNTER — Ambulatory Visit: Payer: Medicare Other | Admitting: Neurology

## 2016-07-22 ENCOUNTER — Ambulatory Visit: Payer: Medicare Other | Admitting: Neurology
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
## 2016-08-21 ENCOUNTER — Encounter: Payer: Self-pay | Admitting: Neurology

## 2016-08-21 ENCOUNTER — Ambulatory Visit (INDEPENDENT_AMBULATORY_CARE_PROVIDER_SITE_OTHER): Payer: Medicare Other | Admitting: Neurology

## 2016-08-21 VITALS — BP 100/70 | HR 88 | Ht 66.0 in | Wt 154.4 lb

## 2016-08-21 DIAGNOSIS — G919 Hydrocephalus, unspecified: Secondary | ICD-10-CM | POA: Diagnosis not present

## 2016-08-21 DIAGNOSIS — G20 Parkinson's disease: Secondary | ICD-10-CM | POA: Diagnosis not present

## 2016-08-21 MED ORDER — CARBIDOPA-LEVODOPA 25-100 MG PO TABS
ORAL_TABLET | ORAL | 1 refills | Status: DC
Start: 2016-08-21 — End: 2017-01-20

## 2016-08-21 MED ORDER — CARBIDOPA-LEVODOPA ER 50-200 MG PO TBCR: 1.0000 | EXTENDED_RELEASE_TABLET | Freq: Every day | ORAL | 0 refills | Status: DC

## 2016-08-21 NOTE — Patient Instructions (Signed)
1.  I would recommend you ask your [facility] about doing pulmonary function testing

## 2016-11-24 ENCOUNTER — Ambulatory Visit: Payer: Medicare Other | Admitting: Neurology

## 2016-12-01 ENCOUNTER — Other Ambulatory Visit: Payer: Self-pay | Admitting: Neurology

## 2016-12-01 DIAGNOSIS — G20 Parkinson's disease: Secondary | ICD-10-CM

## 2016-12-01 MED ORDER — CARBIDOPA-LEVODOPA ER 50-200 MG PO TBCR: 1.0000 | EXTENDED_RELEASE_TABLET | Freq: Every day | ORAL | 2 refills | Status: DC

## 2017-01-20 ENCOUNTER — Telehealth: Payer: Self-pay | Admitting: Neurology

## 2017-01-20 DIAGNOSIS — G20 Parkinson's disease: Secondary | ICD-10-CM

## 2017-01-20 MED ORDER — CARBIDOPA-LEVODOPA 25-100 MG PO TABS: ORAL_TABLET | ORAL | 0 refills | Status: DC

## 2017-01-20 MED ORDER — CARBIDOPA-LEVODOPA ER 50-200 MG PO TBCR: 1.0000 | EXTENDED_RELEASE_TABLET | Freq: Every day | ORAL | 0 refills | Status: DC

## 2017-01-20 NOTE — Telephone Encounter (Signed)
Left message letting patient know I will send tomorrow when Dr. Arbutus Leasat can sign.

## 2017-01-20 NOTE — Telephone Encounter (Signed)
Patient called back and would like a paper copy of his prescription  mailed to him to find a cheaper pharmacy.

## 2017-01-20 NOTE — Telephone Encounter (Signed)
Patient called and needed to get more refills on his medications. Thank you

## 2017-01-20 NOTE — Telephone Encounter (Signed)
Patient has appt in May. RX for Levodopa 25/100 and 50/200 sent to pharmacy.

## 2017-01-22 ENCOUNTER — Ambulatory Visit: Payer: Medicare Other | Admitting: Neurology

## 2017-03-12 ENCOUNTER — Ambulatory Visit: Payer: Medicare Other | Admitting: Neurology

## 2017-04-01 ENCOUNTER — Ambulatory Visit: Payer: Medicare Other | Admitting: Neurology
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
## 2017-04-27 ENCOUNTER — Ambulatory Visit: Payer: Medicare Other | Admitting: Neurology

## 2017-04-27 DIAGNOSIS — Z029 Encounter for administrative examinations, unspecified: Secondary | ICD-10-CM

## 2017-05-12 ENCOUNTER — Encounter: Payer: Self-pay | Admitting: Neurology

## 2017-05-28 ENCOUNTER — Other Ambulatory Visit: Payer: Self-pay | Admitting: Neurology

## 2017-05-28 DIAGNOSIS — G20 Parkinson's disease: Secondary | ICD-10-CM

## 2017-06-01 ENCOUNTER — Other Ambulatory Visit: Payer: Self-pay | Admitting: Neurology

## 2017-06-01 ENCOUNTER — Telehealth: Payer: Self-pay | Admitting: Neurology

## 2017-06-01 DIAGNOSIS — G20 Parkinson's disease: Secondary | ICD-10-CM

## 2017-06-01 NOTE — Telephone Encounter (Signed)
Patient was contacted by his Pharmacy that his Carbidopa Levodopa refill was denied. Please call. Thanks

## 2017-06-01 NOTE — Telephone Encounter (Signed)
Needs an appt.  I had this discussion last visit, when he had gone for 9 months without appt.

## 2017-06-01 NOTE — Telephone Encounter (Signed)
Denied RX for Levodopa.   N/S appt on 04/27/2017. Cancelled appt on 04/01/2017, 03/12/17, 01/22/2017, and 11/24/2016.   Last seen 08/21/2016.   Dr. Arbutus Leasat please advise if you want to fill RX.

## 2017-06-02 NOTE — Telephone Encounter (Signed)
PT called and said the pharmacy won't refill his carbadopa

## 2017-06-02 NOTE — Telephone Encounter (Signed)
LMOM that medication denied because patient needs seen in the office.

## 2017-06-07 ENCOUNTER — Other Ambulatory Visit: Payer: Self-pay | Admitting: Neurology

## 2017-06-07 DIAGNOSIS — G20 Parkinson's disease: Secondary | ICD-10-CM
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
## 2017-07-23 ENCOUNTER — Encounter: Payer: Self-pay | Admitting: Neurology

## 2017-07-23 ENCOUNTER — Ambulatory Visit (INDEPENDENT_AMBULATORY_CARE_PROVIDER_SITE_OTHER): Payer: Medicare Other | Admitting: Neurology

## 2017-07-23 VITALS — BP 128/86 | HR 80 | Ht 66.0 in | Wt 151.0 lb

## 2017-07-23 DIAGNOSIS — G20 Parkinson's disease: Secondary | ICD-10-CM | POA: Diagnosis not present

## 2017-07-23 MED ORDER — CARBIDOPA-LEVODOPA 25-100 MG PO TABS: 2.0000 | ORAL_TABLET | Freq: Three times a day (TID) | ORAL | 1 refills | Status: DC

## 2017-08-17 ENCOUNTER — Other Ambulatory Visit: Payer: Self-pay | Admitting: Neurology

## 2017-08-17 DIAGNOSIS — G20A1 Parkinson's disease without dyskinesia, without mention of fluctuations: Secondary | ICD-10-CM

## 2017-08-17 DIAGNOSIS — G20 Parkinson's disease: Secondary | ICD-10-CM

## 2017-08-25 ENCOUNTER — Other Ambulatory Visit: Payer: Self-pay | Admitting: Neurology

## 2017-08-25 DIAGNOSIS — G20 Parkinson's disease: Secondary | ICD-10-CM

## 2017-09-14 ENCOUNTER — Telehealth: Payer: Self-pay | Admitting: Neurology

## 2017-09-14 NOTE — Telephone Encounter (Signed)
Tried to call patient. No answer. No way to leave message. Can't call [name], he is not on a DPR.

## 2017-09-14 NOTE — Telephone Encounter (Signed)
Kevin Gould called and said Pt is in real distress with his parkinson's and wanted to talk with someone
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
## 2017-09-14 NOTE — Telephone Encounter (Signed)
Call Aurea GraffJoan and let her know and she can decide what to do.
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
## 2017-09-14 NOTE — Telephone Encounter (Signed)
Tried to call again. No answer.   Dr. Arbutus Leasat Lorain Childes- FYI. I did try to call the patient twice with no answer. Caller is not on the DPR.

## 2017-09-15 ENCOUNTER — Telehealth: Payer: Self-pay | Admitting: Psychology

## 2017-09-15 ENCOUNTER — Telehealth: Payer: Self-pay | Admitting: Neurology

## 2017-09-15 NOTE — Telephone Encounter (Signed)
Received an after hours nurse message on patient that he did call our office about 11 pm stating he has been having confusion and hallucinations.  Appt made for 09/23/17 at 10:15 am to see Dr. Arbutus Leasat. Tried to call patient to make him aware, but it went straight to voicemail. I asked him to call back to let us know if he can make appt.

## 2017-09-15 NOTE — Telephone Encounter (Signed)
Mr. Eddie CandleCummings contacted me to confirm that he talked with his friend, [name] [phone]) and his friend will be able to bring him to this appointment. In addition, He stated that [name] has started some [facility] services paperwork in his behalf. I will attempt to contact [name] to find out more details so I can help support resources that are in process.

## 2017-09-15 NOTE — Telephone Encounter (Signed)
TC with patient. He wrote down the upcoming appointment date and time and verbally contracted to notify his friend of this appointment date/time as he will ned help with transportation. He gave me permission to contact his friend and also let him know of the appointment time.   Patient stated that he has been going downhill since his last appointment and he is concerned. I shared that we are concerned and want to get him connected to appropriate resources and services. The plan is for him to come to the office at his appointment next week and I will meet with him to get him connected to appropriate resources. In the meantime, I will connect the patient to Adult Services (not APS) as I think he could benefit from some services. In addition, they could assess him for safety, etc when they are doing a visit in his home.
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
## 2017-09-23 ENCOUNTER — Ambulatory Visit: Payer: Medicare Other | Admitting: Neurology

## 2017-09-23 ENCOUNTER — Telehealth: Payer: Self-pay | Admitting: Psychology

## 2017-09-23 ENCOUNTER — Telehealth: Payer: Self-pay | Admitting: Neurology

## 2017-09-23 NOTE — Telephone Encounter (Signed)
Pt worked in today and cancelled appointment.  Compliance with follow up has been big issue with him previously.  I am quite concerned given issues his friend brought to our attention.  We cannot call friend given not on DPR.  Shanda BumpsJessica, have we gotten in touch with adult services?  Lesly RubensteinJade, lets send patient a certified letter about need to reschedule and importance of follow up.

## 2017-09-23 NOTE — Telephone Encounter (Signed)
[facility] with [facility] today with Rosalio MacadamiaBrandy Thomas to make a formal referral for patient for services: transportation, assistance with medications finically and reminding, possible CNA services, explore in home care services and/or LTC needs. The report was completed today. They will notify me by phone after they screen the referral.  The referral was made through [facility] for Adult Services not necessarily APS as the patient needs resources and assistance.

## 2017-09-23 NOTE — Telephone Encounter (Signed)
Four telephone calls placed for patient:   1-TC to patient on home line and on cell. Left a message on cell. Was not able to leave message on the home line.   2-TC to friend that was planning to bring him to patient's appointment. Left a message.  3-TC to [facility] Social Work Department [phone] extension [phone] to attempt to find status of program enrollment for patient for more care in home or to address LTC needs. I was transferred to his social worker, Delila PereyraBertina Duncan [phone] and left a message regarding some of the safety and care concerns that we have for the patient. I asked her to call me back so we can work together to help patient.  Pending a return call back.   4-TC to [facility] line and left a message indicating that a I needed to make a referral for patient who needs adult services at the minimum as he is becoming less safe at home and unable to make it to necessary medical appointments. Pending a return call back.

## 2017-09-23 NOTE — Telephone Encounter (Signed)
  The plan was to meet with Mr. [facility] today to develop a plan for his long term care needs. However, with his no show today, I called him and could not reach him and also called Adult Services this morning and left a message. I am pending a call back and will document when I speak with them. I will try them again in a few hours if I do not hear back. I will also call the patient and let him know that I am connecting him with adult services and/or APS ( whatever is appropriate) so he can get help that he needs.  I also contacted the friend  [name] to ask for any details contacts for the [facility] information/ process and will try to connect with the social worker there. I will call him again too if I do not hear back. I am not sure how much has been done, but DSS may be able to help with the [facility] too possibly.   I will work on this more today.

## 2017-09-23 NOTE — Telephone Encounter (Signed)
Letter written and will send.
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
## 2017-09-24 NOTE — Telephone Encounter (Signed)
I received a telephone call from Rosalio MacadamiaBrandy Jocsan Mcginley from [facility] that the patient's case has been accepted for a adult services visit to assess for safety, needs and appropriate resources. The social worker assigned will be Joneen Roacharmen Charlton.
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
## 2017-09-29 ENCOUNTER — Telehealth: Payer: Self-pay | Admitting: Neurology
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
## 2017-09-30 NOTE — Telephone Encounter (Signed)
Pt left a message saying he found his medication and does not need an emergency prescription called in
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
## 2017-11-24 ENCOUNTER — Encounter: Payer: Self-pay | Admitting: Neurology

## 2017-11-24 ENCOUNTER — Ambulatory Visit: Payer: Medicare Other | Admitting: Neurology

## 2017-11-24 ENCOUNTER — Encounter: Payer: Self-pay | Admitting: Psychology

## 2017-11-24 VITALS — BP 120/80 | HR 65 | Ht 66.0 in | Wt 144.2 lb

## 2017-11-24 DIAGNOSIS — G20 Parkinson's disease: Secondary | ICD-10-CM | POA: Diagnosis not present

## 2017-11-24 DIAGNOSIS — K117 Disturbances of salivary secretion: Secondary | ICD-10-CM

## 2017-11-24 DIAGNOSIS — Z9119 Patient's noncompliance with other medical treatment and regimen: Secondary | ICD-10-CM

## 2017-11-24 DIAGNOSIS — R443 Hallucinations, unspecified: Secondary | ICD-10-CM

## 2017-11-24 DIAGNOSIS — Z91199 Patient's noncompliance with other medical treatment and regimen due to unspecified reason: Secondary | ICD-10-CM

## 2017-11-24 NOTE — Progress Notes (Signed)
I met with the patient and his friend Arbie Cookey while they were in the clinic today.  Patient signed a ROI for [place] to be able to communicate with his friend Barnabas Lister and Arbie Cookey.  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  Barnabas Lister was the chaplain and Arbie Cookey was a Merchandiser, retail.    He reported he did not know the status update for working with the [place] social worker or the [place] Education officer, museum.  He has both of their contact information at home.  I asked him to give them both a phone call to find out where he is in terms of getting services so he can understand what services that he is eligible for and can also display an interest in getting the services.  He reports that he will make this phone call tomorrow and give me a call back and let me know the status of who we talked to and what services he is eligible for.  We spent some time talking about taking a  active role with his healthcare as he has been struggling to be compliant with medications, appointments and other recommendations.  He verbalized that he is willing to take more active steps in managing his Parkinson's disease.  I asked him to contact his social workers and then contact me back.  I am more than happy to help him but I want him in more involved in his care and resources as well.

## 2017-11-26 ENCOUNTER — Other Ambulatory Visit: Payer: Self-pay | Admitting: Neurology

## 2017-11-26 DIAGNOSIS — G20 Parkinson's disease: Secondary | ICD-10-CM

## 2017-11-27 ENCOUNTER — Encounter: Payer: Self-pay | Admitting: Psychology

## 2017-11-27 NOTE — Progress Notes (Signed)
The patient has contacted me to let me know that he has made an [facility]  Her name is Porfirio MylarCarmen and she will be meeting with him on Friday, 8 February at his home.  In addition, he is reached out to the Child psychotherapistsocial worker at the [facility].  Her name is Victorino DikeJennifer.  He is pending a return call from her.  I will contact him back and let him know that I received all of this information.  In addition, I we will let him know that I am more than happy for the social workers to reach out to me if needed.

## 2017-12-01 ENCOUNTER — Telehealth: Payer: Self-pay | Admitting: Psychology

## 2017-12-01 NOTE — Telephone Encounter (Signed)
The patient called to let me know that he has an appointment with the social worker on Friday.  I also provided him with the senior resources of [employer] so he can inquire about a food delivery program.  He will be back in touch with me after Monday to let me know how his appointment with his social worker went.  He is aware that he can contact me with any needs in between this time frame.  This interaction took approximately 10 minutes.

## 2017-12-03 ENCOUNTER — Telehealth: Payer: Self-pay

## 2017-12-03 NOTE — Telephone Encounter (Signed)
Home number is disconnected. I called patient's cell number which went straight to vm. I left a message letting him know [employer] has tried reaching out to him and gave him their call back number so that he could call them directly.

## 2017-12-03 NOTE — Telephone Encounter (Signed)
Received 2nd call from [employer].  Spoke with Leonette Mostharles.  He states that he has been trying to reach out to pt.  Home phone number is disconnected.  Jerene CannyGave Charles pt's listed mobile number.

## 2017-12-03 NOTE — Telephone Encounter (Signed)
Chelsea with [employer] contacted office in regards to pt's recent Rx.  States that when she ran pt's insurance [employer]) that it shows he does not have a prescription plan associated with his coverage.  She is hoping someone can either contact pt to have him contact [facility] about this, or speak to pt and return call with Rx information.    CB# [phone]

## 2017-12-04 ENCOUNTER — Encounter: Payer: Self-pay | Admitting: Psychology

## 2017-12-08 ENCOUNTER — Telehealth: Payer: Self-pay | Admitting: Neurology

## 2017-12-08 NOTE — Telephone Encounter (Signed)
Patient needs to talk to someone about medication that Dr tat wanted to start him on please call

## 2017-12-08 NOTE — Telephone Encounter (Signed)
Spoke with patient. Aware [employer] has sent him income paperwork in the mail. He will fill out and send in.

## 2018-02-09 ENCOUNTER — Telehealth: Payer: Self-pay | Admitting: Neurology

## 2018-02-09 NOTE — Telephone Encounter (Signed)
Paperwork has already been mailed to [employer] from a prescriber standpoint. I have spoken to them about it already several times.

## 2018-02-09 NOTE — Telephone Encounter (Signed)
Charles left a VM message regarding pt and his medication [employer] and if you received a bridge enrollment form CB# [phone]

## 2018-02-10 NOTE — Telephone Encounter (Signed)
Received bridge form and faxed it.

## 2018-02-24 ENCOUNTER — Telehealth: Payer: Self-pay | Admitting: Neurology

## 2018-02-24 NOTE — Telephone Encounter (Signed)
I can not get in contact with the patient either. I have provided them with all the contacts we have. See several previous phone notes.

## 2018-02-24 NOTE — Telephone Encounter (Signed)
[facility] called regarding pt and his application to receive free medication but is having a hard time trying to contact pt and wanted some help with that CB# [phone]

## 2018-04-20 ENCOUNTER — Other Ambulatory Visit: Payer: Self-pay | Admitting: Neurology

## 2018-04-20 DIAGNOSIS — G20 Parkinson's disease: Secondary | ICD-10-CM

## 2018-05-28 ENCOUNTER — Telehealth: Payer: Self-pay | Admitting: Psychology

## 2018-05-28 NOTE — Telephone Encounter (Signed)
Spoke with patient today. He shared that he worked with Child psychotherapistsocial worker but they wanted him to go to a LTC facility which he was uninterested in. He reported that he is dealing with his hallucinations , but ultimately did not want to go on a new medication for this due to risk of incontience. He reported that he is no longer driving and has a small support network of friends whop will take him to medical appointments. He is aware he has an appointment next week and already has transportation arranged. I did offer him resources of information on SCAT which he declines at this time.  He recognizes multiple barriers, expresses gratitude for concern, but reports a desire to remain comfortable within his own environment and resources.

## 2018-05-28 NOTE — Telephone Encounter (Signed)
Telephone call to patient to attempt to get some updates on how he is doing.  Left a message and asked patient to call me back.  Reminded him that he has an appointment next week but wanted to touch base with him prior to this appointment to check in and see how he is doing to help be prepared for resources etc. for when he is in the clinic.

## 2018-06-01 ENCOUNTER — Ambulatory Visit: Payer: Medicare Other | Admitting: Neurology
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
## 2018-10-05 ENCOUNTER — Ambulatory Visit: Payer: Medicare Other | Admitting: Neurology

## 2018-10-05 ENCOUNTER — Encounter

## 2018-11-05 ENCOUNTER — Other Ambulatory Visit: Payer: Self-pay | Admitting: Neurology

## 2018-11-05 DIAGNOSIS — G20 Parkinson's disease: Secondary | ICD-10-CM

## 2018-11-22 ENCOUNTER — Other Ambulatory Visit: Payer: Self-pay | Admitting: Neurology

## 2018-11-22 DIAGNOSIS — G20 Parkinson's disease: Secondary | ICD-10-CM

## 2018-11-23 ENCOUNTER — Telehealth: Payer: Self-pay | Admitting: Neurology

## 2018-11-23 NOTE — Telephone Encounter (Signed)
I was advised of phone call between Kevin Gould and patient.  Explained that I was not being vengeful, as the patient stated.  He and I have previously discussed his compliance issues, especially with lack of follow up.  We have received phone calls from his friends that he had hallucinations/delusions and then we subsequently made arrangements to see him and patient no showed visits.  We have tried to arrange transportation for him and he was resistant to what was offered by social work (see 05/28/18 SW note- declined SCAT).  His medication and the disease can contribute to hallucinations/delusions and I cannot just refill these medications without appts for evaluations.  He has also been offered return to [facility] (free to him and he used to see them) and has declined this.  He has also cancelled several appointments.  It is not good practice of medicine to continue to refill patients medications without evaluation and I cannot do that without an appointment that he shows up to.  Kevin JonesCarolyn offered him today transfer of care to other local neurology and patient declined.

## 2018-11-23 NOTE — Telephone Encounter (Signed)
Pharmacy # [phone]

## 2018-11-23 NOTE — Telephone Encounter (Signed)
Patient has no showed multiple times. He hasn't been seen in over a year. Dr. Arbutus Leas - please advise on refills. I denied yesterday.

## 2018-11-23 NOTE — Telephone Encounter (Signed)
Left message on machine for patient to call back.  To let him know we will not be able to fill RX until he is seen. Awaiting call back.

## 2018-11-23 NOTE — Telephone Encounter (Signed)
Patient called and ask to speak to the office manager due to his concern that Dr. Arbutus Leas would not refill his medications. I spoke to the patient and explained that he has had several no-show/missed appointments and has not seen Dr. Arbutus Leas in a year. I informed patient the importance of keeping his appointments and follow-up with his provider within the recommended time requested. He was upset and stated that Dr. Arbutus Leas was vengeful and that she was not renewing his medication as a punishment for him not keeping his appointments with her. I explained that this was not the case and that it is her responsibility as a provider to perform a face to face evaluation periodically on each patient depending the needs and severity of each patient. Patient verbalized understanding however was still upset that he would not be able to get his medications refilled. I gave him options to call and follow-up with his PCP or go to an urgent care since they do not require an appointment and it is difficult for him to keep a set appointment time. Patient threatened to call [employer] and file a report if we did not refill his medications. Patient then requested information where he could file an official complaint with [facility]. I provided that information to the patient and we ended the call.

## 2018-11-23 NOTE — Telephone Encounter (Signed)
Spoke with patient and he was very unhappy with this information. I advised him to call his PCP about refilling medication until his appt. He demanded to speak to the manager and was transferred to [place].

## 2018-11-23 NOTE — Telephone Encounter (Signed)
Patient called and needs a refill on his Carbidopa Levodopa medication. He uses [facility] [address]. [facility]. He is scheduled for a Follow Up to see Dr. Arbutus Leas on 01/25/2019. He wanted to let Dr. Arbutus Leas know that he has to depend on Transportation to get him to his appointments because he cannot drive anymore. Thanks

## 2018-11-23 NOTE — Telephone Encounter (Signed)
Unable to refill due to multiple no shows and cx and over a year since seen

## 2018-11-25 ENCOUNTER — Other Ambulatory Visit: Payer: Self-pay | Admitting: Neurology

## 2018-11-30 ENCOUNTER — Other Ambulatory Visit: Payer: Self-pay | Admitting: Neurology

## 2018-11-30 DIAGNOSIS — G20 Parkinson's disease: Secondary | ICD-10-CM

## 2019-01-14 ENCOUNTER — Telehealth: Payer: Self-pay | Admitting: Neurology

## 2019-01-14 NOTE — Telephone Encounter (Signed)
Lmom for patient to call in to schedule an E Visit appointment. Thanks

## 2019-01-25 ENCOUNTER — Encounter

## 2019-01-25 ENCOUNTER — Ambulatory Visit: Payer: Medicare Other | Admitting: Neurology

## 2019-02-01 ENCOUNTER — Encounter: Payer: Self-pay | Admitting: Neurology

## 2019-02-07 ENCOUNTER — Telehealth: Payer: Self-pay | Admitting: Neurology

## 2019-02-07 NOTE — Telephone Encounter (Signed)
Okay to schedule that.

## 2019-02-07 NOTE — Telephone Encounter (Signed)
Patient does not have ability to do E-visit and doesn't have anyone around to help with that. He was interested in a Telephone visit if possible. Please let us know and we will schedule if ok. Thanks!

## 2019-02-08 NOTE — Telephone Encounter (Signed)
Chelsea will call to schedule. 

## 2019-02-16 ENCOUNTER — Telehealth: Payer: Self-pay | Admitting: Neurology

## 2019-02-16 NOTE — Progress Notes (Signed)
Called patient for telephone visit at 2pm.  Pt did not answer.  Attempted to leave VM x 2 but VM kept interrupting stating "are you still there?  To leave a voice mail press 2."  I would then press 2 and try to leave my name and reason for calling but it would interrupt with the same message.

## 2019-02-16 NOTE — Telephone Encounter (Signed)
Left a message for patient to call back to complete a brief memory test ([facility]-Blind).

## 2019-02-17 ENCOUNTER — Other Ambulatory Visit: Payer: Self-pay

## 2019-02-18 ENCOUNTER — Telehealth: Payer: Self-pay | Admitting: Neurology

## 2019-02-18 ENCOUNTER — Telehealth: Payer: Medicare Other | Admitting: Neurology

## 2019-02-18 ENCOUNTER — Encounter: Payer: Self-pay | Admitting: Neurology

## 2019-02-18 ENCOUNTER — Other Ambulatory Visit: Payer: Self-pay

## 2019-02-18 NOTE — Telephone Encounter (Signed)
Patient called the office and said he had been waiting by the phone since 1:45 for his visit this afternoon at 2. He never received anything. I know he is on the line of being discharged. Does someone need to try to call him again or is he already discharged? Thanks!

## 2019-02-18 NOTE — Telephone Encounter (Signed)
I called him at 2pm and left him a message on his VM, although the VM wasn't working well and kept interrupting.  Pt did not pick up the phone for the phone visit.

## 2019-02-21 ENCOUNTER — Encounter: Payer: Self-pay | Admitting: Neurology

## 2019-02-23 ENCOUNTER — Telehealth: Payer: Self-pay | Admitting: Neurology

## 2019-02-23 NOTE — Telephone Encounter (Signed)
Patient dismissed from [facility] by Dr. Rebecca Tat , effective 02/21/2019. Dismissal letter sent out by regular [place] 1st class mail 02/23/19. fbg ° °

## 2019-03-07 ENCOUNTER — Telehealth: Payer: Self-pay | Admitting: Neurology

## 2019-03-07 NOTE — Telephone Encounter (Signed)
He told us in 10/2018 that he had run out of meds (after missing multiple appointments) and hadn't seen [place] in a year then and we didn't refill.  How does he have any medication left since I haven't seen him in 16 months.

## 2019-03-07 NOTE — Telephone Encounter (Signed)
Patient has been dismissed from the practice. He call in wanting to know if he could have a refill on his Carbidopa Levodopa medication. He said he was running low. He uses [facility]. Thanks

## 2019-03-09 NOTE — Telephone Encounter (Signed)
Left message for patient to call me back. 

## 2019-03-11 NOTE — Telephone Encounter (Signed)
[employer], see me about this patient before you call

## 2019-03-11 NOTE — Telephone Encounter (Signed)
Also attempted to call patient no answer left message to return call

## 2019-05-27 NOTE — Progress Notes (Signed)
MOCA TEST PERFORMED ON 02/17/19 BY DANA CHAMBERLAIN  CHARTED TODAY

## 2019-06-16 ENCOUNTER — Inpatient Hospital Stay (HOSPITAL_COMMUNITY)
Admission: EM | Admit: 2019-06-16 | Discharge: 2019-06-22 | DRG: 557 | Disposition: A | Discharge status: Skilled Nursing Facility | Payer: Medicare Other | Attending: Internal Medicine | Admitting: Internal Medicine

## 2019-06-16 ENCOUNTER — Other Ambulatory Visit: Payer: Self-pay

## 2019-06-16 ENCOUNTER — Emergency Department (HOSPITAL_COMMUNITY): Payer: Medicare Other

## 2019-06-16 ENCOUNTER — Other Ambulatory Visit: Payer: Self-pay | Admitting: *Deleted

## 2019-06-16 ENCOUNTER — Encounter (HOSPITAL_COMMUNITY): Payer: Self-pay | Admitting: Emergency Medicine

## 2019-06-16 ENCOUNTER — Encounter: Payer: Self-pay | Admitting: *Deleted

## 2019-06-16 DIAGNOSIS — E876 Hypokalemia: Secondary | ICD-10-CM | POA: Diagnosis present

## 2019-06-16 DIAGNOSIS — L89221 Pressure ulcer of left hip, stage 1: Secondary | ICD-10-CM | POA: Diagnosis present

## 2019-06-16 DIAGNOSIS — G20 Parkinson's disease: Secondary | ICD-10-CM | POA: Diagnosis present

## 2019-06-16 DIAGNOSIS — I10 Essential (primary) hypertension: Secondary | ICD-10-CM | POA: Diagnosis present

## 2019-06-16 DIAGNOSIS — K219 Gastro-esophageal reflux disease without esophagitis: Secondary | ICD-10-CM | POA: Diagnosis present

## 2019-06-16 DIAGNOSIS — R627 Adult failure to thrive: Secondary | ICD-10-CM | POA: Diagnosis present

## 2019-06-16 DIAGNOSIS — F0280 Dementia in other diseases classified elsewhere without behavioral disturbance: Secondary | ICD-10-CM | POA: Diagnosis not present

## 2019-06-16 DIAGNOSIS — E43 Unspecified severe protein-calorie malnutrition: Secondary | ICD-10-CM | POA: Diagnosis present

## 2019-06-16 DIAGNOSIS — R748 Abnormal levels of other serum enzymes: Secondary | ICD-10-CM | POA: Diagnosis present

## 2019-06-16 DIAGNOSIS — E870 Hyperosmolality and hypernatremia: Secondary | ICD-10-CM | POA: Diagnosis present

## 2019-06-16 DIAGNOSIS — G9341 Metabolic encephalopathy: Secondary | ICD-10-CM | POA: Diagnosis present

## 2019-06-16 DIAGNOSIS — W19XXXA Unspecified fall, initial encounter: Secondary | ICD-10-CM

## 2019-06-16 DIAGNOSIS — R531 Weakness: Secondary | ICD-10-CM | POA: Diagnosis present

## 2019-06-16 DIAGNOSIS — E860 Dehydration: Secondary | ICD-10-CM | POA: Diagnosis present

## 2019-06-16 DIAGNOSIS — E878 Other disorders of electrolyte and fluid balance, not elsewhere classified: Secondary | ICD-10-CM | POA: Diagnosis present

## 2019-06-16 DIAGNOSIS — R54 Age-related physical debility: Secondary | ICD-10-CM | POA: Diagnosis present

## 2019-06-16 DIAGNOSIS — G8929 Other chronic pain: Secondary | ICD-10-CM | POA: Diagnosis present

## 2019-06-16 DIAGNOSIS — E538 Deficiency of other specified B group vitamins: Secondary | ICD-10-CM | POA: Diagnosis present

## 2019-06-16 DIAGNOSIS — Z7982 Long term (current) use of aspirin: Secondary | ICD-10-CM

## 2019-06-16 DIAGNOSIS — E785 Hyperlipidemia, unspecified: Secondary | ICD-10-CM | POA: Diagnosis present

## 2019-06-16 DIAGNOSIS — I959 Hypotension, unspecified: Secondary | ICD-10-CM | POA: Diagnosis present

## 2019-06-16 DIAGNOSIS — Z79899 Other long term (current) drug therapy: Secondary | ICD-10-CM

## 2019-06-16 DIAGNOSIS — L03116 Cellulitis of left lower limb: Secondary | ICD-10-CM | POA: Diagnosis present

## 2019-06-16 DIAGNOSIS — Z20828 Contact with and (suspected) exposure to other viral communicable diseases: Secondary | ICD-10-CM | POA: Diagnosis present

## 2019-06-16 DIAGNOSIS — Y92009 Unspecified place in unspecified non-institutional (private) residence as the place of occurrence of the external cause: Secondary | ICD-10-CM

## 2019-06-16 DIAGNOSIS — E7800 Pure hypercholesterolemia, unspecified: Secondary | ICD-10-CM | POA: Diagnosis present

## 2019-06-16 DIAGNOSIS — Z87891 Personal history of nicotine dependence: Secondary | ICD-10-CM

## 2019-06-16 DIAGNOSIS — Z9114 Patient's other noncompliance with medication regimen: Secondary | ICD-10-CM

## 2019-06-16 DIAGNOSIS — Z681 Body mass index (BMI) 19 or less, adult: Secondary | ICD-10-CM

## 2019-06-16 DIAGNOSIS — R296 Repeated falls: Secondary | ICD-10-CM | POA: Diagnosis present

## 2019-06-16 DIAGNOSIS — R64 Cachexia: Secondary | ICD-10-CM | POA: Diagnosis present

## 2019-06-16 DIAGNOSIS — M6282 Rhabdomyolysis: Secondary | ICD-10-CM | POA: Diagnosis present

## 2019-06-16 DIAGNOSIS — Z791 Long term (current) use of non-steroidal anti-inflammatories (NSAID): Secondary | ICD-10-CM

## 2019-06-16 DIAGNOSIS — I361 Nonrheumatic tricuspid (valve) insufficiency: Secondary | ICD-10-CM | POA: Diagnosis not present

## 2019-06-16 DIAGNOSIS — L8990 Pressure ulcer of unspecified site, unspecified stage: Secondary | ICD-10-CM | POA: Insufficient documentation

## 2019-06-16 LAB — CBC
HCT: 42.4 % (ref 39.0–52.0)
HCT: 45.3 % (ref 39.0–52.0)
Hemoglobin: 14.2 g/dL (ref 13.0–17.0)
Hemoglobin: 15.8 g/dL (ref 13.0–17.0)
MCH: 32.6 pg (ref 26.0–34.0)
MCH: 32.9 pg (ref 26.0–34.0)
MCHC: 33.5 g/dL (ref 30.0–36.0)
MCHC: 34.9 g/dL (ref 30.0–36.0)
MCV: 97.2 fL (ref 80.0–100.0)
MCV: 94.4 fL (ref 80.0–100.0)
Platelets: 190 10*3/uL (ref 150–400)
Platelets: 195 10*3/uL (ref 150–400)
RBC: 4.36 MIL/uL (ref 4.22–5.81)
RBC: 4.8 MIL/uL (ref 4.22–5.81)
RDW: 13.2 % (ref 11.5–15.5)
RDW: 13.2 % (ref 11.5–15.5)
WBC: 14.3 10*3/uL — ABNORMAL HIGH (ref 4.0–10.5)
WBC: 12.5 10*3/uL — ABNORMAL HIGH (ref 4.0–10.5)
nRBC: 0 % (ref 0.0–0.2)
nRBC: 0 % (ref 0.0–0.2)

## 2019-06-16 LAB — DIFFERENTIAL
Abs Immature Granulocytes: 0.09 10*3/uL — ABNORMAL HIGH (ref 0.00–0.07)
Basophils Absolute: 0 10*3/uL (ref 0.0–0.1)
Basophils Relative: 0 %
Eosinophils Absolute: 0 10*3/uL (ref 0.0–0.5)
Eosinophils Relative: 0 %
Immature Granulocytes: 1 %
Lymphocytes Relative: 3 %
Lymphs Abs: 0.5 10*3/uL — ABNORMAL LOW (ref 0.7–4.0)
Monocytes Absolute: 0.8 10*3/uL (ref 0.1–1.0)
Monocytes Relative: 5 %
Neutro Abs: 13 10*3/uL — ABNORMAL HIGH (ref 1.7–7.7)
Neutrophils Relative %: 91 %

## 2019-06-16 LAB — ETHANOL: Alcohol, Ethyl (B): <10 mg/dL (ref <10)

## 2019-06-16 LAB — COMPREHENSIVE METABOLIC PANEL
ALT: 52 U/L — ABNORMAL HIGH (ref 0–44)
AST: 192 U/L — ABNORMAL HIGH (ref 15–41)
Albumin: 3.8 g/dL (ref 3.5–5.0)
Alkaline Phosphatase: 40 U/L (ref 38–126)
Anion gap: 13 (ref 5–15)
BUN: 35 mg/dL — ABNORMAL HIGH (ref 8–23)
CO2: 26 mmol/L (ref 22–32)
Calcium: 8.7 mg/dL — ABNORMAL LOW (ref 8.9–10.3)
Chloride: 105 mmol/L (ref 98–111)
Creatinine, Ser: 1.08 mg/dL (ref 0.61–1.24)
GFR calc Af Amer: >60 mL/min (ref >60)
GFR calc non Af Amer: >60 mL/min (ref >60)
Glucose, Bld: 106 mg/dL — ABNORMAL HIGH (ref 70–99)
Potassium: 3.3 mmol/L — ABNORMAL LOW (ref 3.5–5.1)
Sodium: 144 mmol/L (ref 135–145)
Total Bilirubin: 2.2 mg/dL — ABNORMAL HIGH (ref 0.3–1.2)
Total Protein: 6.2 g/dL — ABNORMAL LOW (ref 6.5–8.1)

## 2019-06-16 LAB — AMMONIA: Ammonia: 19 umol/L (ref 9–35)

## 2019-06-16 LAB — I-STAT CHEM 8, ED
BUN: 37 mg/dL — ABNORMAL HIGH (ref 8–23)
Calcium, Ion: 1.19 mmol/L (ref 1.15–1.40)
Chloride: 103 mmol/L (ref 98–111)
Creatinine, Ser: 1 mg/dL (ref 0.61–1.24)
Glucose, Bld: 100 mg/dL — ABNORMAL HIGH (ref 70–99)
HCT: 44 % (ref 39.0–52.0)
Hemoglobin: 15 g/dL (ref 13.0–17.0)
Potassium: 3.7 mmol/L (ref 3.5–5.1)
Sodium: 144 mmol/L (ref 135–145)
TCO2: 29 mmol/L (ref 22–32)

## 2019-06-16 LAB — CREATININE, SERUM
Creatinine, Ser: 0.99 mg/dL (ref 0.61–1.24)
GFR calc Af Amer: >60 mL/min (ref >60)
GFR calc non Af Amer: >60 mL/min (ref >60)

## 2019-06-16 LAB — APTT: aPTT: 29 seconds (ref 24–36)

## 2019-06-16 LAB — VITAMIN B12: Vitamin B-12: 83 pg/mL — ABNORMAL LOW (ref 180–914)

## 2019-06-16 LAB — PROTIME-INR
INR: 1.3 — ABNORMAL HIGH (ref 0.8–1.2)
Prothrombin Time: 16.3 seconds — ABNORMAL HIGH (ref 11.4–15.2)

## 2019-06-16 LAB — GLUCOSE, CAPILLARY: Glucose-Capillary: 93 mg/dL (ref 70–99)

## 2019-06-16 LAB — TSH: TSH: 0.977 u[IU]/mL (ref 0.350–4.500)

## 2019-06-16 LAB — CK: Total CK: 14658 U/L — ABNORMAL HIGH (ref 49–397)

## 2019-06-16 MED ORDER — ASPIRIN 300 MG RE SUPP
300.0000 mg | Freq: Every day | RECTAL | Status: DC
  Filled 2019-06-16: qty 1

## 2019-06-16 MED ORDER — ACETAMINOPHEN 325 MG PO TABS
650.0000 mg | ORAL_TABLET | ORAL | Status: DC | PRN
  Administered 2019-06-17: 20:00:00 650 mg via ORAL
  Filled 2019-06-16: qty 2

## 2019-06-16 MED ORDER — ENOXAPARIN SODIUM 40 MG/0.4ML ~~LOC~~ SOLN
40.0000 mg | SUBCUTANEOUS | Status: DC
  Administered 2019-06-16 – 2019-06-21 (×6): 40 mg via SUBCUTANEOUS
  Filled 2019-06-16 (×6): qty 0.4

## 2019-06-16 MED ORDER — ASPIRIN 81 MG PO CHEW
81.0000 mg | CHEWABLE_TABLET | Freq: Every day | ORAL | Status: DC
  Administered 2019-06-18 – 2019-06-20 (×3): 81 mg
  Filled 2019-06-16 (×3): qty 1

## 2019-06-16 MED ORDER — SODIUM CHLORIDE 0.9 % IV SOLN: INTRAVENOUS | Status: DC

## 2019-06-16 MED ORDER — CYANOCOBALAMIN 1000 MCG/ML IJ SOLN
1000.0000 ug | Freq: Every day | INTRAMUSCULAR | Status: AC
  Administered 2019-06-16 – 2019-06-20 (×5): 1000 ug via INTRAMUSCULAR
  Filled 2019-06-16 (×6): qty 1

## 2019-06-16 MED ORDER — STROKE: EARLY STAGES OF RECOVERY BOOK
Freq: Once | Status: AC
  Administered 2019-06-16: 21:00:00
  Filled 2019-06-16: qty 1

## 2019-06-16 MED ORDER — ASPIRIN 300 MG RE SUPP
300.0000 mg | Freq: Every day | RECTAL | Status: DC
  Administered 2019-06-16: 21:00:00 300 mg via RECTAL
  Filled 2019-06-16 (×2): qty 1

## 2019-06-16 MED ORDER — ACETAMINOPHEN 650 MG RE SUPP: 650.0000 mg | RECTAL | Status: DC | PRN

## 2019-06-16 MED ORDER — ASPIRIN EC 81 MG PO TBEC
81.0000 mg | DELAYED_RELEASE_TABLET | Freq: Once | ORAL | Status: DC
  Filled 2019-06-16: qty 1

## 2019-06-16 MED ORDER — IOHEXOL 350 MG/ML SOLN
80.0000 mL | Freq: Once | INTRAVENOUS | Status: AC | PRN
  Administered 2019-06-16: 15:00:00 80 mL via INTRAVENOUS

## 2019-06-16 MED ORDER — SODIUM CHLORIDE 0.9 % IV SOLN
INTRAVENOUS | Status: DC
  Administered 2019-06-16: 21:00:00 via INTRAVENOUS

## 2019-06-16 MED ORDER — ACETAMINOPHEN 160 MG/5ML PO SOLN: 650.0000 mg | ORAL | Status: DC | PRN
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
## 2019-06-16 NOTE — ED Notes (Signed)
Attempted to call report. Asked to call back in 5 minutes.

## 2019-06-16 NOTE — ED Notes (Signed)
Patient transported to MRI 

## 2019-06-16 NOTE — ED Notes (Signed)
  Attempted to call report.  RN will call me back. 

## 2019-06-16 NOTE — ED Triage Notes (Addendum)
Pt arrives [facility] pt was found by neighbor sitting on the toilet leaned over towards the left sided with his neck holding himself up and left arm dangling. EMS reported that the pts face was slightly bluish when they arrived but that resolved once pt moved. Left arm was also numb on ems arrival and now it feels like pins and needles. EMS states that left side appears wear in that arm. Pt in c-collar. Baseline parkinsons
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
## 2019-06-16 NOTE — Code Documentation (Signed)
He was last known well at 1700 yesterday.  Patient with history of parkinson and dementia.   This afternoon about 13:30  His neighbor found him sitting on the toilet with his head against the sink and left arm hanging limply.  Patient arrived via EMS at 1401  Code Stroke called at 66.  Stat labs and head CT done.  Dr Aroor at bedside to assess patient.  NIHSS 5 missed question, dysarthria and Left arm.  He is able to squeeze with his hand but cannot lift his arm.  CTA head and neck done. No acute intervention Plan MRI, admit work up [place] off with ED RN

## 2019-06-16 NOTE — ED Notes (Signed)
Called [facility] to activate code stroke  

## 2019-06-17 ENCOUNTER — Inpatient Hospital Stay (HOSPITAL_COMMUNITY): Payer: Medicare Other

## 2019-06-17 ENCOUNTER — Encounter (HOSPITAL_COMMUNITY): Payer: Medicare Other

## 2019-06-17 DIAGNOSIS — M6282 Rhabdomyolysis: Principal | ICD-10-CM

## 2019-06-17 DIAGNOSIS — I10 Essential (primary) hypertension: Secondary | ICD-10-CM

## 2019-06-17 DIAGNOSIS — I361 Nonrheumatic tricuspid (valve) insufficiency: Secondary | ICD-10-CM

## 2019-06-17 DIAGNOSIS — R296 Repeated falls: Secondary | ICD-10-CM

## 2019-06-17 DIAGNOSIS — K219 Gastro-esophageal reflux disease without esophagitis: Secondary | ICD-10-CM

## 2019-06-17 LAB — LIPID PANEL
Cholesterol: 222 mg/dL — ABNORMAL HIGH (ref 0–200)
HDL: 67 mg/dL (ref >40)
LDL Cholesterol: 143 mg/dL — ABNORMAL HIGH (ref 0–99)
Total CHOL/HDL Ratio: 3.3 RATIO
Triglycerides: 59 mg/dL (ref <150)
VLDL: 12 mg/dL (ref 0–40)

## 2019-06-17 LAB — RAPID URINE DRUG SCREEN, HOSP PERFORMED
Amphetamines: NOT DETECTED
Barbiturates: NOT DETECTED
Benzodiazepines: NOT DETECTED
Cocaine: NOT DETECTED
Opiates: NOT DETECTED
Tetrahydrocannabinol: NOT DETECTED

## 2019-06-17 LAB — URINALYSIS, ROUTINE W REFLEX MICROSCOPIC
Bilirubin Urine: NEGATIVE
Glucose, UA: NEGATIVE mg/dL
Ketones, ur: 20 mg/dL — AB
Leukocytes,Ua: NEGATIVE
Nitrite: NEGATIVE
Protein, ur: 100 mg/dL — AB
Specific Gravity, Urine: 1.039 — ABNORMAL HIGH (ref 1.005–1.030)
pH: 5 (ref 5.0–8.0)

## 2019-06-17 LAB — HEMOGLOBIN A1C
Hgb A1c MFr Bld: 5.3 % (ref 4.8–5.6)
Mean Plasma Glucose: 105.41 mg/dL

## 2019-06-17 LAB — CK: Total CK: 14870 U/L — ABNORMAL HIGH (ref 49–397)

## 2019-06-17 LAB — SARS CORONAVIRUS 2 (TAT 6-24 HRS): SARS Coronavirus 2: NEGATIVE

## 2019-06-17 LAB — ECHOCARDIOGRAM COMPLETE: Weight: 2035.29 oz

## 2019-06-17 MED ORDER — RESOURCE THICKENUP CLEAR PO POWD
Freq: Once | ORAL | Status: AC
  Administered 2019-06-17: 15:00:00 via ORAL
  Filled 2019-06-17: qty 125

## 2019-06-17 MED ORDER — CARBIDOPA-LEVODOPA 25-100 MG PO TABS: 1.0000 | ORAL_TABLET | Freq: Every day | ORAL | Status: DC

## 2019-06-17 MED ORDER — CARBIDOPA-LEVODOPA 25-100 MG PO TABS: 1.0000 | ORAL_TABLET | Freq: Three times a day (TID) | ORAL | Status: DC

## 2019-06-17 MED ORDER — CARBIDOPA-LEVODOPA 25-100 MG PO TABS
1.0000 | ORAL_TABLET | Freq: Every day | ORAL | Status: DC
  Administered 2019-06-18 – 2019-06-22 (×5): 1 via ORAL
  Filled 2019-06-17 (×5): qty 1

## 2019-06-17 MED ORDER — FAMOTIDINE IN NACL 20-0.9 MG/50ML-% IV SOLN
20.0000 mg | Freq: Two times a day (BID) | INTRAVENOUS | Status: DC
  Administered 2019-06-17 (×2): 20 mg via INTRAVENOUS
  Filled 2019-06-17 (×3): qty 50

## 2019-06-17 MED ORDER — SODIUM CHLORIDE 0.9 % IV SOLN
INTRAVENOUS | Status: DC
  Administered 2019-06-17 – 2019-06-18 (×4): via INTRAVENOUS

## 2019-06-17 MED ORDER — CARBIDOPA-LEVODOPA 25-100 MG PO TABS
1.0000 | ORAL_TABLET | Freq: Three times a day (TID) | ORAL | Status: DC
  Administered 2019-06-17 – 2019-06-22 (×14): 1 via ORAL
  Filled 2019-06-17 (×14): qty 1
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
## 2019-06-17 NOTE — Progress Notes (Signed)
  Echocardiogram 2D Echocardiogram has been performed.  Kevin Gould G [name] 06/17/2019, 10:30 AM

## 2019-06-17 NOTE — Progress Notes (Signed)
06/17/19 1655  PT Visit Information  Last PT Received On 06/17/19  Assistance Needed +2  PT/OT/SLP Co-Evaluation/Treatment Yes  Reason for Co-Treatment Complexity of the patient's impairments (multi-system involvement);Necessary to address cognition/behavior during functional activity;To address functional/ADL transfers  PT goals addressed during session Mobility/safety with mobility  History of Present Illness 73 y.o. male, with  history of Parkinson disease, hypertension, hyperlipidemia, headache, multiple falls, dementia who was found down by neighbor (unknown for how long) and fount to have confusion, L side weakness. Admitted for further workup. MRI brain with no evidence of acute infarct. Pt with rhabdomyolysis  Precautions  Precautions Fall  Restrictions  Weight Bearing Restrictions No (to be further assessed)  Other Position/Activity Restrictions Pt with L hip swelling and redness, and RN requesting further imaging. Will continue to follow for updated weightbearing status.   Home Living  Family/patient expects to be discharged to: Private residence  Living Arrangements Alone   Lives With Alone  Prior Function  Level of Independence Independent with assistive device(s)  Comments pt reports some use of cane for mobility, endorses he has difficulty with ADL but doesn't appear pt had assist (difficult to obtain full PLOF from pt)  Communication  Communication Expressive difficulties;HOH (pt very soft spoken, mumbled speech, HOH)  Pain Assessment  Pain Assessment No/denies pain  Cognition  Arousal/Alertness Awake/alert  Behavior During Therapy [facility] for tasks assessed/performed;Flat affect  Overall Cognitive Status History of cognitive impairments - at baseline  General Comments per chart pt with hx of dementia, oriented to location, name, DOB, difficulty obtaining full PLOF and pt with delay in command following, pt also HOH and requires repetition  Difficult to assess due to   (very soft spoken, HOH)  Upper Extremity Assessment  Upper Extremity Assessment Defer to OT evaluation  Lower Extremity Assessment  Lower Extremity Assessment Generalized weakness;Difficult to assess due to impaired cognition;LLE deficits/detail  LLE Deficits / Details L hip noted to have increased swelling with redness and warmth. RN notified during session.   Bed Mobility  Overal bed mobility Needs Assistance  Bed Mobility Rolling  Rolling Total assist;+2 for physical assistance;+2 for safety/equipment  General bed mobility comments totalA+2 for rolling to L/R for pericare, did not attempt further mobility as pt noted to have large spot on lateral L hip which included redness and small blisters, L hip edemous/appears to have fluid build up - RN notified and request to hold further mobility   Transfers  General transfer comment deferred as pt awaiting imaging for L hip.   General Comments  General comments (skin integrity, edema, etc.) RN contacted MD to make aware of status of L hip during session   PT - End of Session  Activity Tolerance Patient tolerated treatment well  Patient left in bed;with call bell/phone within reach;with nursing/sitter in room;with bed alarm set  Nurse Communication Mobility status;Other (comment) (L hip redness/swelling)  PT Assessment  PT Recommendation/Assessment Patient needs continued PT services  PT Visit Diagnosis Muscle weakness (generalized) (M62.81);Unsteadiness on feet (R26.81);History of falling (Z91.81);Difficulty in walking, not elsewhere classified (R26.2)  PT Problem List Decreased strength;Decreased balance;Decreased mobility;Decreased cognition;Decreased activity tolerance;Decreased range of motion;Decreased knowledge of use of DME;Decreased safety awareness;Decreased knowledge of precautions  Barriers to Discharge Decreased caregiver support  PT Plan  PT Frequency (ACUTE ONLY) Min 2X/week  PT Treatment/Interventions (ACUTE ONLY) DME  instruction;Gait training;Functional mobility training;Therapeutic activities;Therapeutic exercise;Balance training;Patient/family education;Cognitive remediation  AM-PAC PT "6 Clicks" Mobility Outcome Measure (Version 2)  Help needed turning from your back  to your side while in a flat bed without using bedrails? 1  Help needed moving from lying on your back to sitting on the side of a flat bed without using bedrails? 1  Help needed moving to and from a bed to a chair (including a wheelchair)? 1  Help needed standing up from a chair using your arms (e.g., wheelchair or bedside chair)? 1  Help needed to walk in hospital room? 1  Help needed climbing 3-5 steps with a railing?  1  6 Click Score 6  Consider Recommendation of Discharge To: CIR/SNF/LTACH  PT Recommendation  Follow Up Recommendations SNF;Supervision/Assistance - 24 hour  PT equipment None recommended by PT  Individuals Consulted  Consulted and Agree with Results and Recommendations Patient  Acute Rehab PT Goals  Patient Stated Goal to go home  PT Goal Formulation With patient  Time For Goal Achievement 07/01/19  Potential to Achieve Goals Fair  PT Time Calculation  PT Start Time (ACUTE ONLY) 1124  PT Stop Time (ACUTE ONLY) 1153  PT Time Calculation (min) (ACUTE ONLY) 29 min  PT General Charges  $$ ACUTE PT VISIT 1 Visit  PT Evaluation  $PT Eval Moderate Complexity 1 Mod  Written Expression  Dominant Hand Right   Pt admitted secondary to problem above with deficits above. Pt very soft spoken, with mumbled speech and cognitive deficits, therefore difficult to obtain history. Pt with notable swelling and redness in L hip during session; RN notified and contacted MD during session. Mobility limited to rolling for pericare until cleared. Required total A +2 to roll from L/R. Anticipate pt will require SNF level therapies at d/c. Will continue to follow acutely to maximize functional mobility independence and safety.   Leighton Ruff, PT, DPT  Acute Rehabilitation Services  Pager: [phone] Office: [phone]

## 2019-06-17 NOTE — Evaluation (Addendum)
Occupational Therapy Evaluation Patient Details Name: Kevin Gould MRN: [id number] DOB: 1946/08/01 Today's Date: 06/17/2019    History of Present Illness 73 y.o. male, with  history of Parkinson disease, hypertension, hyperlipidemia, headache, multiple falls, dementia who was found down by neighbor (unknown for how long) and fount to have confusion, L side weakness. Admitted for further workup. MRI brain with no evidence of acute infarct.    Clinical Impression    This 73 y/o male presents with the above. Diffcult to fully obtain PLOF as pt very soft spoken, HOH and with some confusion present. Per pt/chart pt lives alone and only has intermittent assist from neighbors/friends. Pt presenting with generalized weakness including significant LUE weakness, and impaired cognition. He currently requires totalA+2 for bed mobility and for completion of peri-care after incontinent BM. Deferred further mobility as pt noted to have large red area on L hip, small blisters and edema/(appears fluid-like) in the area. RN notified who contacted MD to assess. Suspect he will likely require +2 assist for additional mobility progression. Pt will benefit from continued acute OT services; given pt current deficits and that he lives alone recommend SNF level therapies at time of discharge. Will follow.    Follow Up Recommendations  SNF;Supervision/Assistance - 24 hour    Equipment Recommendations  Other (comment)(TBD in next venue)           Precautions / Restrictions Precautions Precautions: Fall Restrictions Weight Bearing Restrictions: No(to be further assessed)      Mobility Bed Mobility Overal bed mobility: Needs Assistance Bed Mobility: Rolling Rolling: Total assist;+2 for physical assistance;+2 for safety/equipment         General bed mobility comments: totalA+2 for rolling to L/R for pericare, did not attempt further mobility as pt noted to have large spot on lateral L hip which included  redness and small blisters, L hip edemous/appears to have fluid build up - RN notified and request to hold further mobility   Transfers                 General transfer comment: deferred    Balance                                           ADL either performed or assessed with clinical judgement   ADL Overall ADL's : Needs assistance/impaired                             Toileting- Clothing Manipulation and Hygiene: Total assistance;+2 for physical assistance;+2 for safety/equipment;Bed level Toileting - Clothing Manipulation Details (indicate cue type and reason): pt with incontinent BM noted start of session, required totalA for peri-care      Functional mobility during ADLs: Total assistance;+2 for physical assistance;+2 for safety/equipment(bed mobility only) General ADL Comments: pt overall totalA at this time      Vision         Perception     Praxis      Pertinent Vitals/Pain Pain Assessment: No/denies pain Faces Pain Scale: No hurt     Hand Dominance Right   Extremity/Trunk Assessment Upper Extremity Assessment Upper Extremity Assessment: LUE deficits/detail;Generalized weakness;Difficult to assess due to impaired cognition LUE Deficits / Details: grossly 1-/5 throughout, very weak  LUE Coordination: decreased fine motor;decreased gross motor   Lower Extremity Assessment Lower Extremity Assessment: Defer to PT  evaluation       Communication Communication Communication: Expressive difficulties;HOH(pt very soft spoken, mumbled speech, HOH)   Cognition Arousal/Alertness: Awake/alert Behavior During Therapy: WFL for tasks assessed/performed;Flat affect Overall Cognitive Status: History of cognitive impairments - at baseline                                 General Comments: per chart pt with hx of dementia, oriented to location, name, DOB, difficulty obtaining full PLOF and pt with delay in command  following, pt also HOH and requires repetition   General Comments  RN contacted MD to make aware of status of L hip during session     Exercises     Shoulder Instructions      Home Living Family/patient expects to be discharged to:: Private residence Living Arrangements: Alone                                  Lives With: Alone    Prior Functioning/Environment Level of Independence: Independent with assistive device(s)        Comments: pt reports some use of cane for mobility, endorses he has difficulty with ADL but doesn't appear pt had assist (difficult to obtain full PLOF from pt)        OT Problem List: Decreased strength;Decreased range of motion;Decreased activity tolerance;Impaired balance (sitting and/or standing);Decreased cognition;Decreased safety awareness;Decreased knowledge of use of DME or AE;Impaired UE functional use      OT Treatment/Interventions: Self-care/ADL training;Therapeutic exercise;Energy conservation;DME and/or AE instruction;Therapeutic activities;Balance training;Patient/family education;Cognitive remediation/compensation    OT Goals(Current goals can be found in the care plan section) Acute Rehab OT Goals Patient Stated Goal: home OT Goal Formulation: With patient Time For Goal Achievement: 07/01/19 Potential to Achieve Goals: Good  OT Frequency: Min 2X/week   Barriers to D/C:            Co-evaluation PT/OT/SLP Co-Evaluation/Treatment: Yes Reason for Co-Treatment: Complexity of the patient's impairments (multi-system involvement);Necessary to address cognition/behavior during functional activity;To address functional/ADL transfers   OT goals addressed during session: ADL's and self-care      AM-PAC OT "6 Clicks" Daily Activity     Outcome Measure Help from another person eating meals?: A Lot Help from another person taking care of personal grooming?: Total Help from another person toileting, which includes using  toliet, bedpan, or urinal?: Total Help from another person bathing (including washing, rinsing, drying)?: Total Help from another person to put on and taking off regular upper body clothing?: Total Help from another person to put on and taking off regular lower body clothing?: Total 6 Click Score: 7   End of Session Nurse Communication: Mobility status  Activity Tolerance: Patient tolerated treatment well Patient left: in bed;with call bell/phone within reach;with bed alarm set;with restraints reapplied  OT Visit Diagnosis: Muscle weakness (generalized) (M62.81);History of falling (Z91.81);Other symptoms and signs involving cognitive function                Time: [id number] OT Time Calculation (min): 29 min Charges:  OT General Charges $OT Visit: 1 Visit OT Evaluation $OT Eval Moderate Complexity: 1 Mod  Marcy SirenBreanna Valjean Ruppel, OT [facility] Services Pager [phone] Office [phone]   Orlando PennerBreanna L Reyanne Hussar 06/17/2019, 2:22 PM

## 2019-06-17 NOTE — Progress Notes (Signed)
PROGRESS NOTE    Kevin OhmRobert J Gould  WUJ:[id number] DOB: 09/28/1946 DOA: 06/16/2019 PCP: Kevin Gould, [name], MD   Brief Narrative: As per HPI: 73 y.o. male, with  history of Parkinson disease, hypertension, hyperlipidemia, headache, multiple falls, dementia, and lives home by himself, no close family members, been taking care of by his friends and neighbors, apparently patient lives in a very unhealthy environment, house completely disheveled, records almost looks like hoarders house, patient was on the floor for unknown period of time, apparently he was found in the bathroom floor by his neighbor when he came to check on him, friends report patient has been falling very frequently recently, not taking his medication and very stiff, patient was noted to have some left-sided weakness per ED and code stroke called on admission. In ER: No evidence of acute CVA, work-up significant for low B12 at 83, total CK at 14,658, creatinine at 1.08, mild elevated white blood cell count at 14.3.   Subjective: Alert,awake, mumbles words, opened mouth when asked, toungue very dry, tremors on rt hand on mitten. Condom catheter+. Doesn;t much command very well. Frail looking  Assessment & Plan:  Rhabdomyolysis: Unclear traumatic versus atraumatic. Unknown circumstance, patient was found on the floor for a few hours.  Total CK at 14,000, will continue aggressive IV hydration.  Creatinine appears to stable.  Monitor labs regularly.   Left hip Swelling CT scan shows extensive edema or hemorrhage in the subcutaneous fat.  Patient is afebrile and leukocytosis downtrending without antibiotics.  Monitor closely on IV fluids.  Failure to thrive patient extremely cachectic and also confused.  Passed swallow eval now slowly start on diet along with his oral medication.  Acute encephalopathy likely metabolic in the setting of rhabdomyolysis possible fall also with failure to thrive.  Patient had extensive work-up on admission with  CT head CT spine and CT angiogram head and neck and MRI brain . No acute stroke.  Continue PT OT, continue supportive care will need placement.  Parkinson disease likely not taking medication patient having tremors.  Resumed home meds as passed swallow eval.   B12 deficiency continue replacement.  Severe protein calorie malnutrition: Continue to augment nutrition consult dietitian.  R/O  TIA: No acute stroke on MRI brain.  Unclear if this is TIA or from not taking his medication.  2D echo pending carotid was unremarkable on CTA neck so hold off carotid  Duplex. hba1c <6, ldl 143- but statins contraindicated due to rhabdo.  Frequent falls: PT OT to continue  GERD: add Pepcid.  Essential hypertension: Pressure fairly stable.    Body mass index is 19.34 kg/m.    DVT prophylaxis: lovenox Code Status: full Family Communication:none at bedside Disposition Plan: Remains inpatient pending clinical improvement.   Consultants:  Neurology  Procedures:  CT left hip without contrast: 1. No acute osseous abnormality of the left hip. 2. Extensive edema or hemorrhage into the subcutaneous fat at the lateral aspect of the left buttock and left hip. 3. Edema or hemorrhage into the subcutaneous fat at the lateral aspect of the left buttock and left hip.  TTE 1. The left ventricle has hyperdynamic systolic function, with an ejection fraction of >65%. The cavity size was normal. There is mildly increased left ventricular wall thickness. Left ventricular diastolic Doppler parameters are consistent with  impaired relaxation.  2. The right ventricle has normal systolic function. The cavity was normal. There is no increase in right ventricular wall thickness.  3. Trivial pericardial effusion is present.  4. The mitral valve is grossly normal. Moderate thickening of the mitral valve leaflet.  5. The tricuspid valve is grossly normal.  6. The aortic valve is grossly normal. Aortic valve  regurgitation is trivial by color flow Doppler.  7. The aorta is normal unless otherwise noted.  8. The aortic root and ascending aorta are normal in size and structure.  9. The atrial septum is grossly normal.  MRI Brain  Motion degraded exam. No evidence of acute infarct. Lateral and third ventriculomegaly is similar to prior MRI 12/01/2013. This may reflect obstructive hydrocephalus at the level of the cerebral aqueduct. Redemonstrated asymmetric prominence of the right temporal horn with possible gray matter heterotopia lining the right temporal horn.  Mild chronic small vessel ischemic disease.   : CT ANGIOGRAPHY HEAD AND NECK   FINDINGS: CTA NECK FINDINGS  Aortic arch: Standard branching. The origin of the innominate artery and left common carotid artery are not included in the field of view. The right common carotid artery, right vertebral artery and left vertebral artery origins are patent without high-grade stenosis.  Right carotid system: The right common and cervical internal carotid arteries are patent without significant stenosis (50% or greater).  Left carotid system: Beyond its nonvisualized origin, the left common carotid artery is patent without significant stenosis (50% or greater). The left cervical internal carotid artery is patent without significant stenosis (50% or greater).  Vertebral arteries: The bilateral vertebral arteries are patent within the neck without significant stenosis (50% or greater). The left vertebral artery is significantly dominant  Skeleton: Please refer to report for concurrent cervical spine CT for a description of bony findings.  Other neck: No soft tissue neck mass or pathologically enlarged cervical chain lymph nodes.  Upper chest: The imaged lung apices are clear.  Review of the MIP images confirms the above findings  CTA HEAD FINDINGS  Anterior circulation: The intracranial internal carotid  artery arteries are patent without significant stenosis. The right middle and anterior cerebral arteries are patent without significant proximal stenosis. The left middle and anterior cerebral arteries are patent without significant proximal stenosis. No anterior circulation aneurysm is identified.  Posterior circulation: The right vertebral artery is significantly non dominant and diminutive beyond the origin of the right PICA. The distal right vertebral artery just proximal to the vertebrobasilar junction is poorly delineated and a stenosis at this site is difficult to exclude. The dominant left vertebral artery is widely patent, as is the basilar artery. Predominantly fetal origin of the right posterior cerebral artery which is patent without significant proximal stenosis. The left posterior cerebral artery is patent without significant proximal stenosis.  Venous sinuses: Limited of assessment due to arterial phase of contrast.  Anatomic variants: None significant  Review of the MIP images confirms the above findings  These results were communicated to Dr. Laurence SlateAroor At 3:43 pmon 8/20/2020by text page via the [facility] messaging system.   CTA HEAD:  The right vertebral artery is significantly non dominant, and diminutive beyond the origin of the right PICA. The distal right vertebral artery just proximal to the vertebrobasilar junction is poorly delineated and a stenosis at this site is difficult to exclude.  Otherwise, no evidence large vessel occlusion or proximal high-grade arterial stenosis within the head.  CTA NECK:  The origin of the right innominate and left common carotid arteries are not included in the field of view. Within this limitation, the bilateral common carotid, internal carotid and vertebral arteries are patent within the neck without  significant stenosis (50% or greater).      Microbiology: None  Antimicrobials: Anti-infectives (From  admission, onward)   None     Objective: Vitals:   06/17/19 0233 06/17/19 0356 06/17/19 0358 06/17/19 0808  BP: (!) 122/96 (!) 118/97 (!) 123/95 (!) 126/94  Pulse: (!) 101 98 100 90  Resp: 19 20  20   Temp: 98.1 F (36.7 C) 97.7 F (36.5 C)  99.5 F (37.5 C)  TempSrc: Axillary Oral  Oral  SpO2: 98% 97%  94%  Weight:        Intake/Output Summary (Last 24 hours) at 06/17/2019 0921 Last data filed at 06/17/2019 0300 Gross per 24 hour  Intake 442.92 ml  Output 300 ml  Net 142.92 ml   Filed Weights   06/16/19 2029  Weight: 57.7 kg   Weight change:   Body mass index is 19.34 kg/m.  Intake/Output from previous day: 08/20 0701 - 08/21 0700 In: 442.9 [I.V.:442.9] Out: 300 [Urine:300] Intake/Output this shift: No intake/output data recorded.  Examination:  General exam: Appears calm, alert,fraila, not following commands much, very dry, sick looking HEENT:PERRL,Oral mucosa DRY, Ear/Nose normal on gross exam Respiratory system: Bilateral equal air entry, normal vesicular breath sounds, no wheezes or crackles  Cardiovascular system: S1 & S2 heard,No JVD, murmurs. Gastrointestinal system: Abdomen is  soft, non tender, non distended, BS +  Nervous System:Alert. EXAM LIMITED AS NOT FOLLOWING COMMANDS,tremors on both UE Extremities: Left hip with swelling edema, no clubbing, distal peripheral pulses palpable. Skin: No rashes, lesions, no icterus MSK: Normal muscle bulk,tone ,power  Medications:  Scheduled Meds:  aspirin  81 mg Per Tube Daily   Or   aspirin  300 mg Rectal Daily   cyanocobalamin  1,000 mcg Intramuscular Daily   enoxaparin (LOVENOX) injection  40 mg Subcutaneous Q24H   Continuous Infusions:  sodium chloride 75 mL/hr at 06/16/19 2105   sodium chloride      Data Reviewed: I have personally reviewed following labs and imaging studies  CBC: Recent Labs  Lab 06/16/19 1431 06/16/19 1617 06/16/19 2200  WBC 14.3*  --  12.5*  NEUTROABS 13.0*  --   --    HGB 14.2 15.0 15.8  HCT 42.4 44.0 45.3  MCV 97.2  --  94.4  PLT 190  --  195   Basic Metabolic Panel: Recent Labs  Lab 06/16/19 1431 06/16/19 1617 06/16/19 2200  NA 144 144  --   K 3.3* 3.7  --   CL 105 103  --   CO2 26  --   --   GLUCOSE 106* 100*  --   BUN 35* 37*  --   CREATININE 1.08 1.00 0.99  CALCIUM 8.7*  --   --    GFR: Estimated Creatinine Clearance: 55.9 mL/min (by C-G formula based on SCr of 0.99 mg/dL). Liver Function Tests: Recent Labs  Lab 06/16/19 1431  AST 192*  ALT 52*  ALKPHOS 40  BILITOT 2.2*  PROT 6.2*  ALBUMIN 3.8   No results for input(s): LIPASE, AMYLASE in the last 168 hours. Recent Labs  Lab 06/16/19 1602  AMMONIA 19   Coagulation Profile: Recent Labs  Lab 06/16/19 1431  INR 1.3*   Cardiac Enzymes: Recent Labs  Lab 06/16/19 1431 06/17/19 0207  CKTOTAL 14,658* 14,870*   BNP (last 3 results) No results for input(s): PROBNP in the last 8760 hours. HbA1C: Recent Labs    06/17/19 0207  HGBA1C 5.3   CBG: Recent Labs  Lab 06/16/19 2020  GLUCAP 93   Lipid Profile: Recent Labs    06/17/19 0207  CHOL 222*  HDL 67  LDLCALC 143*  TRIG 59  CHOLHDL 3.3   Thyroid Function Tests: Recent Labs    06/16/19 1602  TSH 0.977   Anemia Panel: Recent Labs    06/16/19 1602  VITAMINB12 83*   Sepsis Labs: No results for input(s): PROCALCITON, LATICACIDVEN in the last 168 hours.  Recent Results (from the past 240 hour(s))  SARS CORONAVIRUS 2 Nasal Swab Aptima Multi Swab     Status: None   Collection Time: 06/16/19  4:55 PM   Specimen: Aptima Multi Swab; Nasal Swab  Result Value Ref Range Status   SARS Coronavirus 2 NEGATIVE NEGATIVE Final    Comment: (NOTE) SARS-CoV-2 target nucleic acids are NOT DETECTED. The SARS-CoV-2 RNA is generally detectable in upper and lower respiratory specimens during the acute phase of infection. Negative results do not preclude SARS-CoV-2 infection, do not rule out co-infections with  other pathogens, and should not be used as the sole basis for treatment or other patient management decisions. Negative results must be combined with clinical observations, patient history, and epidemiological information. The expected result is Negative. Fact Sheet for Patients: SugarRoll.be Fact Sheet for Healthcare Providers: https://www.woods-mathews.com/ This test is not yet approved or cleared by the [place] FDA and  has been authorized for detection and/or diagnosis of SARS-CoV-2 by FDA under an Emergency Use Authorization (EUA). This EUA will remain  in effect (meaning this test can be used) for the duration of the COVID-19 declaration under Section 56 4(b)(1) of the Act, 21 U.S.C. section 360bbb-3(b)(1), unless the authorization is terminated or revoked sooner. Performed at [facility], [place] [address], [place], [place] [address]       Radiology Studies: Ct Angio Head W Or Wo Contrast  Result Date: 06/16/2019 CLINICAL DATA:  Focal neuro deficit, greater than 6 hours, stroke suspected EXAM: CT ANGIOGRAPHY HEAD AND NECK TECHNIQUE: Multidetector CT imaging of the head and neck was performed using the standard protocol during bolus administration of intravenous contrast. Multiplanar CT image reconstructions and MIPs were obtained to evaluate the vascular anatomy. Carotid stenosis measurements (when applicable) are obtained utilizing NASCET criteria, using the distal internal carotid diameter as the denominator. CONTRAST:  50mL OMNIPAQUE IOHEXOL 350 MG/ML SOLN COMPARISON:  Concurrent noncontrast head CT 06/16/2019, brain MRI 12/01/2013, head CT 11/30/2013 FINDINGS: CTA NECK FINDINGS Aortic arch: Standard branching. The origin of the innominate artery and left common carotid artery are not included in the field of view. The right common carotid artery, right vertebral artery and left vertebral artery origins are patent without  high-grade stenosis. Right carotid system: The right common and cervical internal carotid arteries are patent without significant stenosis (50% or greater). Left carotid system: Beyond its nonvisualized origin, the left common carotid artery is patent without significant stenosis (50% or greater). The left cervical internal carotid artery is patent without significant stenosis (50% or greater). Vertebral arteries: The bilateral vertebral arteries are patent within the neck without significant stenosis (50% or greater). The left vertebral artery is significantly dominant Skeleton: Please refer to report for concurrent cervical spine CT for a description of bony findings. Other neck: No soft tissue neck mass or pathologically enlarged cervical chain lymph nodes. Upper chest: The imaged lung apices are clear. Review of the MIP images confirms the above findings CTA HEAD FINDINGS Anterior circulation: The intracranial internal carotid artery arteries are patent without significant stenosis. The right middle  and anterior cerebral arteries are patent without significant proximal stenosis. The left middle and anterior cerebral arteries are patent without significant proximal stenosis. No anterior circulation aneurysm is identified. Posterior circulation: The right vertebral artery is significantly non dominant and diminutive beyond the origin of the right PICA. The distal right vertebral artery just proximal to the vertebrobasilar junction is poorly delineated and a stenosis at this site is difficult to exclude. The dominant left vertebral artery is widely patent, as is the basilar artery. Predominantly fetal origin of the right posterior cerebral artery which is patent without significant proximal stenosis. The left posterior cerebral artery is patent without significant proximal stenosis. Venous sinuses: Limited of assessment due to arterial phase of contrast. Anatomic variants: None significant Review of the MIP images  confirms the above findings These results were communicated to Dr. Laurence Slate At 3:43 pmon 8/20/2020by text page via the [facility] messaging system. IMPRESSION: CTA HEAD: The right vertebral artery is significantly non dominant, and diminutive beyond the origin of the right PICA. The distal right vertebral artery just proximal to the vertebrobasilar junction is poorly delineated and a stenosis at this site is difficult to exclude. Otherwise, no evidence large vessel occlusion or proximal high-grade arterial stenosis within the head. CTA NECK: The origin of the right innominate and left common carotid arteries are not included in the field of view. Within this limitation, the bilateral common carotid, internal carotid and vertebral arteries are patent within the neck without significant stenosis (50% or greater). Electronically Signed   By: Jackey Loge   On: 06/16/2019 15:45   Ct Angio Neck W Or Wo Contrast  Result Date: 06/16/2019 CLINICAL DATA:  Focal neuro deficit, greater than 6 hours, stroke suspected EXAM: CT ANGIOGRAPHY HEAD AND NECK TECHNIQUE: Multidetector CT imaging of the head and neck was performed using the standard protocol during bolus administration of intravenous contrast. Multiplanar CT image reconstructions and MIPs were obtained to evaluate the vascular anatomy. Carotid stenosis measurements (when applicable) are obtained utilizing NASCET criteria, using the distal internal carotid diameter as the denominator. CONTRAST:  80mL OMNIPAQUE IOHEXOL 350 MG/ML SOLN COMPARISON:  Concurrent noncontrast head CT 06/16/2019, brain MRI 12/01/2013, head CT 11/30/2013 FINDINGS: CTA NECK FINDINGS Aortic arch: Standard branching. The origin of the innominate artery and left common carotid artery are not included in the field of view. The right common carotid artery, right vertebral artery and left vertebral artery origins are patent without high-grade stenosis. Right carotid system: The right common and cervical  internal carotid arteries are patent without significant stenosis (50% or greater). Left carotid system: Beyond its nonvisualized origin, the left common carotid artery is patent without significant stenosis (50% or greater). The left cervical internal carotid artery is patent without significant stenosis (50% or greater). Vertebral arteries: The bilateral vertebral arteries are patent within the neck without significant stenosis (50% or greater). The left vertebral artery is significantly dominant Skeleton: Please refer to report for concurrent cervical spine CT for a description of bony findings. Other neck: No soft tissue neck mass or pathologically enlarged cervical chain lymph nodes. Upper chest: The imaged lung apices are clear. Review of the MIP images confirms the above findings CTA HEAD FINDINGS Anterior circulation: The intracranial internal carotid artery arteries are patent without significant stenosis. The right middle and anterior cerebral arteries are patent without significant proximal stenosis. The left middle and anterior cerebral arteries are patent without significant proximal stenosis. No anterior circulation aneurysm is identified. Posterior circulation: The right vertebral artery is  significantly non dominant and diminutive beyond the origin of the right PICA. The distal right vertebral artery just proximal to the vertebrobasilar junction is poorly delineated and a stenosis at this site is difficult to exclude. The dominant left vertebral artery is widely patent, as is the basilar artery. Predominantly fetal origin of the right posterior cerebral artery which is patent without significant proximal stenosis. The left posterior cerebral artery is patent without significant proximal stenosis. Venous sinuses: Limited of assessment due to arterial phase of contrast. Anatomic variants: None significant Review of the MIP images confirms the above findings These results were communicated to Dr. Laurence Slate  At 3:43 pmon 8/20/2020by text page via the [facility] messaging system. IMPRESSION: CTA HEAD: The right vertebral artery is significantly non dominant, and diminutive beyond the origin of the right PICA. The distal right vertebral artery just proximal to the vertebrobasilar junction is poorly delineated and a stenosis at this site is difficult to exclude. Otherwise, no evidence large vessel occlusion or proximal high-grade arterial stenosis within the head. CTA NECK: The origin of the right innominate and left common carotid arteries are not included in the field of view. Within this limitation, the bilateral common carotid, internal carotid and vertebral arteries are patent within the neck without significant stenosis (50% or greater). Electronically Signed   By: Jackey Loge   On: 06/16/2019 15:45   Ct Cervical Spine Wo Contrast  Result Date: 06/16/2019 CLINICAL DATA:  Trauma, found leading to left side with left arm numbness EXAM: CT CERVICAL SPINE WITHOUT CONTRAST TECHNIQUE: Multiplanar CT images of the cervical spine were reconstructed from previously performed CT angiography of the head and neck. COMPARISON:  06/16/2019 head CT FINDINGS: Alignment: No subluxation.  Facet alignment within normal limits. Skull base and vertebrae: No acute fracture. No primary bone lesion or focal pathologic process. Possible small hemangioma T1. Soft tissues and spinal canal: No prevertebral fluid or swelling. No visible canal hematoma. Disc levels: Partial fusion at C4-C5. Fusion of posterior facet at C4-C5. Advanced degenerative change C5-C6 and C6-C7. multiple level facet degenerative change. Upper chest: Lung apices clear Other: None IMPRESSION: Degenerative changes of the cervical spine without definite acute osseous abnormality. Findings for the CT angiogram of the head and neck are dictated in a separate report. Electronically Signed   By: Jasmine Pang M.D.   On: 06/16/2019 15:25   Mr Brain Wo Contrast  Result Date:  06/16/2019 CLINICAL DATA:  Stroke, follow-up EXAM: MRI HEAD WITHOUT CONTRAST TECHNIQUE: Multiplanar, multiecho pulse sequences of the brain and surrounding structures were obtained without intravenous contrast. COMPARISON:  Non-contrast CT head and CT angiogram head/neck performed earlier the same day 06/16/2019, brain MRI 12/01/2013 FINDINGS: Brain: Multiple sequences are motion degraded, most notably the axial acquired T2 weighted and T2 weighted FLAIR sequences. This limits evaluation. No evidence of acute infarct. No evidence of intracranial mass. No midline shift or extra-axial collection. No chronic intracranial hemorrhage. Lateral and third ventriculomegaly is similar to prior brain MRI 12/01/2013. Redemonstrated asymmetric prominence of the right temporal horn with possible gray matter heterotopia lining the right temporal horn. Mild scattered T2/FLAIR hyperintensity within the cerebral white matter is nonspecific, but consistent with chronic small vessel ischemic disease. Mild generalized parenchymal atrophy. Vascular: Flow voids maintained within the proximal large vessels. Prominent arachnoid granulation within the right transverse sinus. Skull and upper cervical spine: Normal marrow signal. Sinuses/Orbits: Imaged globes and orbits demonstrate no acute abnormality. Redemonstrated cyst surrounding multiple right maxillary teeth, encroaching upon the right maxillary sinus.  Small right maxillary sinus mucous retention cyst. Mild ethmoid sinus mucosal thickening. No significant mastoid effusion. IMPRESSION: Motion degraded exam. No evidence of acute infarct. Lateral and third ventriculomegaly is similar to prior MRI 12/01/2013. This may reflect obstructive hydrocephalus at the level of the cerebral aqueduct. Redemonstrated asymmetric prominence of the right temporal horn with possible gray matter heterotopia lining the right temporal horn. Mild chronic small vessel ischemic disease. Electronically Signed    By: Jackey Loge   On: 06/16/2019 17:45   Dg Chest Portable 1 View  Result Date: 06/16/2019 CLINICAL DATA:  Fall. EXAM: PORTABLE CHEST 1 VIEW COMPARISON:  11/30/2013 FINDINGS: Patient is rotated to the left. Mild elevation of the left hemidiaphragm with moderate consolidation over the lateral left base which may be due to pulmonary contusion versus effusion and atelectasis. Right lung is clear. No pneumothorax. Cardiomediastinal silhouette is unremarkable. No acute fracture visualized. IMPRESSION: Opacification over the lateral left base which may be due to pulmonary contusion versus effusion with atelectasis. Electronically Signed   By: Elberta Fortis M.D.   On: 06/16/2019 16:15   Ct Head Code Stroke Wo Contrast  Result Date: 06/16/2019 CLINICAL DATA:  Code stroke. Additional history provided: Patient arrives via EMS found by neighbor sitting on toilet leaned over toward the left side with neck holding up patient, left arm dangling. Left arm was numb on EMS arrival and now feels like pins and needles. EXAM: CT HEAD WITHOUT CONTRAST TECHNIQUE: Contiguous axial images were obtained from the base of the skull through the vertex without intravenous contrast. COMPARISON:  Brain MRI 12/01/2013, head CT 11/30/2013 FINDINGS: Brain: Streak artifact limits evaluation. Within this limitation, no evidence of acute demarcated territorial infarction. No evidence of acute intracranial hemorrhage. Redemonstrated lateral and third ventriculomegaly with asymmetric prominence of the right temporal horn. 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 No midline shift or extra-axial collection. Ill-defined hypoattenuation of the cerebral white matter is nonspecific, but consistent with chronic small vessel ischemic disease Vascular: No definite hyperdense vessel. Atherosclerotic calcification of the carotid artery siphons and vertebrobasilar system Skull: No calvarial  fracture. Imaged globes and orbits demonstrate no acute abnormality. Sinuses/Orbits: There is a large cyst surrounding right-sided maxillary teeth encroaching upon the right maxillary sinus. The visualized paranasal sinuses are otherwise well aerated. No significant mastoid effusion ASPECTS (Alberta Stroke Program Early CT Score) - Ganglionic level infarction (caudate, lentiform nuclei, internal capsule, insula, M1-M3 cortex): 7 - Supraganglionic infarction (M4-M6 cortex): 3 Total score (0-10 with 10 being normal): 10 These results were communicated to Dr. Laurence Slate At 2:59 pmon 8/20/2020by text page via the [facility] messaging system. IMPRESSION: Examination limited by streak artifact. No evidence of acute demarcated territorial infarction or acute intracranial hemorrhage. ASPECTS 10. Lateral and third ventriculomegaly is similar to prior brain MRI 12/01/2013. 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 Cyst surrounding multiple right-sided maxillary teeth encroaching upon the right maxillary sinus. Electronically Signed   By: Jackey Loge   On: 06/16/2019 15:03      LOS: 1 day   Time spent: More than 50% of that time was spent in counseling and/or coordination of care.  Lanae Boast, MD [facility]  06/17/2019, 9:21 AM

## 2019-06-17 NOTE — Progress Notes (Addendum)
Pt has a huge swollen left lateral hip that is warm to touch with soft base like fluid collection or inflammation. Pt is unable to correctly describe how he obtained injury. RN is not comfortable with PT/OT evaluation. Please advice. RN will; continue to assess pt's hip.   MD notified. CT ordered
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
## 2019-06-18 LAB — CBC
HCT: 39.4 % (ref 39.0–52.0)
Hemoglobin: 13.4 g/dL (ref 13.0–17.0)
MCH: 33 pg (ref 26.0–34.0)
MCHC: 34 g/dL (ref 30.0–36.0)
MCV: 97 fL (ref 80.0–100.0)
Platelets: 160 10*3/uL (ref 150–400)
RBC: 4.06 MIL/uL — ABNORMAL LOW (ref 4.22–5.81)
RDW: 13.6 % (ref 11.5–15.5)
WBC: 9.7 10*3/uL (ref 4.0–10.5)
nRBC: 0 % (ref 0.0–0.2)

## 2019-06-18 LAB — BASIC METABOLIC PANEL
Anion gap: 9 (ref 5–15)
BUN: 33 mg/dL — ABNORMAL HIGH (ref 8–23)
CO2: 24 mmol/L (ref 22–32)
Calcium: 8.4 mg/dL — ABNORMAL LOW (ref 8.9–10.3)
Chloride: 116 mmol/L — ABNORMAL HIGH (ref 98–111)
Creatinine, Ser: 0.88 mg/dL (ref 0.61–1.24)
GFR calc Af Amer: >60 mL/min (ref >60)
GFR calc non Af Amer: >60 mL/min (ref >60)
Glucose, Bld: 97 mg/dL (ref 70–99)
Potassium: 3.4 mmol/L — ABNORMAL LOW (ref 3.5–5.1)
Sodium: 149 mmol/L — ABNORMAL HIGH (ref 135–145)

## 2019-06-18 LAB — CK: Total CK: 6480 U/L — ABNORMAL HIGH (ref 49–397)

## 2019-06-18 MED ORDER — FAMOTIDINE 40 MG/5ML PO SUSR
20.0000 mg | Freq: Two times a day (BID) | ORAL | Status: DC
  Administered 2019-06-18 – 2019-06-22 (×9): 20 mg via ORAL
  Filled 2019-06-18 (×10): qty 2.5

## 2019-06-18 MED ORDER — WHITE PETROLATUM EX OINT
TOPICAL_OINTMENT | CUTANEOUS | Status: AC
  Administered 2019-06-18: 0.2
  Filled 2019-06-18: qty 28.35

## 2019-06-18 MED ORDER — HYDRALAZINE HCL 20 MG/ML IJ SOLN
10.0000 mg | Freq: Four times a day (QID) | INTRAMUSCULAR | Status: DC | PRN
  Administered 2019-06-20: 04:00:00 10 mg via INTRAVENOUS
  Filled 2019-06-18: qty 1

## 2019-06-18 MED ORDER — SODIUM CHLORIDE 0.9 % IV SOLN
100.0000 mg | Freq: Two times a day (BID) | INTRAVENOUS | Status: AC
  Administered 2019-06-18 – 2019-06-21 (×6): 100 mg via INTRAVENOUS
  Filled 2019-06-18 (×6): qty 100

## 2019-06-18 MED ORDER — LABETALOL HCL 5 MG/ML IV SOLN
10.0000 mg | Freq: Once | INTRAVENOUS | Status: AC
  Administered 2019-06-18: 21:00:00 10 mg via INTRAVENOUS
  Filled 2019-06-18: qty 4

## 2019-06-18 MED ORDER — POTASSIUM CHLORIDE IN NACL 20-0.45 MEQ/L-% IV SOLN
INTRAVENOUS | Status: DC
  Administered 2019-06-18 – 2019-06-19 (×4): via INTRAVENOUS
  Filled 2019-06-18 (×3): qty 1000

## 2019-06-18 NOTE — NC FL2 (Signed)
[place] LEVEL OF CARE SCREENING TOOL     IDENTIFICATION  Patient Name: Kevin Gould Birthdate: 11-26-1945 Sex: male Admission Date (Current Location): 06/16/2019  [facility] and [place] Number:  Herbalist and Address:  [facility] [place], [place] [address], [place], [place] [address]      Provider Number: [id number]  Attending Physician Name and Address:  Kevin Pert, MD  Relative Name and Phone Number:  Kevin Gould, [place], Friend, [place]    Current Level of Care: Hospital Recommended Level of Care: [place] Prior Approval Number:    Date Approved/Denied: 12/02/13 PASRR Number: [id number]  Discharge Plan: SNF    Current Diagnoses: Patient Active Problem List   Diagnosis Date Noted  . Parkinson's disease ([place]) 12/25/2014  . Hydrocephalus, acquired ([place]) 12/25/2014  . Rhabdomyolysis 11/30/2013  . Hypotension 11/30/2013  . Frequent falls 11/30/2013  . Memory problem 11/30/2013  . CERUMEN IMPACTION, BILATERAL 12/11/2010  . Essential hypertension 01/23/2009  . HYPERCHOLESTEROLEMIA 07/25/2008  . NEUROPATHY 07/17/2008  . LOW BACK PAIN, MILD 07/17/2008  . INSOMNIA 07/17/2008  . GERD 08/27/2004  . PARESTHESIA 09/03/1994    Orientation RESPIRATION BLADDER Height & Weight     Self  Normal Incontinent, External catheter Weight: 127 lb 3.3 oz (57.7 kg) Height:     BEHAVIORAL SYMPTOMS/MOOD NEUROLOGICAL BOWEL NUTRITION STATUS      Incontinent Diet(Dys 1 diet, thin liquids)  AMBULATORY STATUS COMMUNICATION OF NEEDS Skin   Limited Assist Verbally(Slurred speech) Normal(Pt has dry skin)                       Personal Care Assistance Level of Assistance  Bathing, Feeding, Dressing, Total care Bathing Assistance: Limited assistance Feeding assistance: Limited assistance Dressing Assistance: Limited assistance Total Care Assistance: Limited assistance   Functional  Limitations Info  Sight, Hearing, Speech Sight Info: Adequate Hearing Info: Adequate Speech Info: Impaired    SPECIAL CARE FACTORS FREQUENCY  PT (By licensed PT), OT (By licensed OT), Speech therapy     PT Frequency: 5x/wk OT Frequency: 5x/wk     Speech Therapy Frequency: 3x/wk      Contractures Contractures Info: Not present    Additional Factors Info  Code Status, Allergies Code Status Info: Full Code Allergies Info: No Known Allergies           Current Medications (06/18/2019):  This is the current hospital active medication list Current Facility-Administered Medications  Medication Dose Route Frequency Provider Last Rate Last Dose  . 0.45 % NaCl with KCl 20 mEq / L infusion   Intravenous Continuous Kc, Ramesh, MD 100 mL/hr at 06/18/19 1003    . acetaminophen (TYLENOL) tablet 650 mg  650 mg Oral Q4H PRN Elgergawy, Silver Huguenin, MD   650 mg at 06/17/19 1935   Or  . acetaminophen (TYLENOL) solution 650 mg  650 mg Per Tube Q4H PRN Elgergawy, Silver Huguenin, MD       Or  . acetaminophen (TYLENOL) suppository 650 mg  650 mg Rectal Q4H PRN Elgergawy, Silver Huguenin, MD      . aspirin chewable tablet 81 mg  81 mg Per Tube Daily Steenwyk, Yujing Z, RPH   81 mg at 06/18/19 1005   Or  . aspirin suppository 300 mg  300 mg Rectal Daily Mirian Capuchin, RPH   300 mg at 06/16/19 2106  . carbidopa-levodopa (SINEMET IR) 25-100 MG per tablet immediate release 1 tablet  1 tablet Oral TID Lanae BoastKc, Ramesh, MD   1 tablet at 06/18/19 1005  . carbidopa-levodopa (SINEMET IR) 25-100 MG per tablet immediate release 1 tablet  1 tablet Oral Daily Kc, Ramesh, MD   1 tablet at 06/18/19 1007  . cyanocobalamin ((VITAMIN B-12)) injection 1,000 mcg  1,000 mcg Intramuscular Daily Elgergawy, Leana [place]awood S, MD   1,000 mcg at 06/18/19 1006  . enoxaparin (LOVENOX) injection 40 mg  40 mg Subcutaneous Q24H Elgergawy, Leana Roeawood S, MD   40 mg at 06/17/19 2106  . famotidine (PEPCID) 40 MG/5ML suspension 20 mg  20 mg Oral BID Bertram MillardMaccia,  Michael A, RPH   20 mg at 06/18/19 1007     Discharge Medications: Please see discharge summary for a list of discharge medications.  Relevant Imaging Results:  Relevant Lab Results:   Additional Information SSN: [id number]  Kevin Gould, LCSWA

## 2019-06-18 NOTE — Progress Notes (Signed)
PROGRESS NOTE    Kevin Gould  ZOX:[name] DOB: 06/03/1946 DOA: 06/16/2019 PCP: [name], [name], MD   Brief Narrative: As per HPI: 73 y.o. male, with  history of Parkinson disease, hypertension, hyperlipidemia, headache, multiple falls, dementia, and lives home by himself, no close family members, been taking care of by his friends and neighbors, apparently patient lives in a very unhealthy environment, house completely disheveled, records almost looks like hoarders house, patient was on the floor for unknown period of time, apparently he was found in the bathroom floor by his neighbor when he came to check on him, friends report patient has been falling very frequently recently, not taking his medication and very stiff, patient was noted to have some left-sided weakness per ED and code stroke called on admission. In ER: No evidence of acute CVA, work-up significant for low B12 at 83, total CK at 14,658, creatinine at 1.08, mild elevated white blood cell count at 14.3.   Subjective:  More alert and awake this am. Does not converse or follow commands, speaking in muffled voice , when asked how are you states "It started up well and ended up well" in very faint voice, barely audible. On RA. Tmax 101 yesterday night.  Assessment & Plan:  Rhabdomyolysis: Likely atraumatic from laying on the floor.  Unknown circumstance, patient was found on the floor for a few hours.  Total CK at 14,000-->6400, cont ivf change to half with KCL as sodium creepign up Creatinine appears to stable.  Monitor labs regularly.  . Left hip Swelling CT scan shows extensive edema or hemorrhage in the subcutaneous fat. leukocytosis downtrending without antibiotics.  However patient had temp spike-add doxycycline for now.Monitor closely on IV fluids.  Fever episode-:UA unremarkable CXR on admittions "Opacification over the lateral left base which may be due topulmonary contusion versus effusion with atelectasis.". on RA.  Initial leucocytosis resolve with ivf. Does have area of skin of the left lateral that is red and swollen and I will add doxycycline to cover for cellulitis. Recent Labs  Lab 06/16/19 1431 06/16/19 2200 06/18/19 0351  WBC 14.3* 12.5* 9.7   Hypokalemia-cont with kcl in ivf.  Failure to thrive patient extremely cachectic and also confused.  Passed swallow eval and placed on diet, continue with aspiration precaution and assistance.    Acute encephalopathy likely metabolic in the setting of rhabdomyolysis possible fall also with failure to thrive.  Patient had extensive work-up on admission with CT head CT spine and CT angiogram head and neck and MRI brain . No acute stroke.  He appears much more alert awake but he still not conversant, not following commands. Continue PT OT, continue supportive care will need placement.  Parkinson disease likely not taking medication patient having tremors.  Resumed home meds as passed swallow eval.   B12 deficiency continue replacement.  Severe protein calorie malnutrition: Continue to augment nutrition consult dietitian.  R/O  TIA: No acute stroke on MRI brain.  Unclear if this is TIA or from not taking his medication.  2D echo pending carotid was unremarkable on CTA neck so hold off carotid  Duplex. hba1c <6, ldl 143- but statins contraindicated due to rhabdo.  Frequent falls: PT OT to continue  GERD: add Pepcid.  Essential hypertension: Pressure fairly stable.    Skin pressure injury vs hematoma on left lateral thigh- POA, likely form laying on left side on floor. see ct report. Wound consult-foam application and off loading..   Body mass index is 19.34 kg/m.  DVT prophylaxis: lovenox Code Status: full Family Communication:none at bedside Disposition Plan: Remains inpatient pending clinical improvement.   Consultants:  Neurology  Procedures:  CT left hip without contrast: 1. No acute osseous abnormality of the left hip. 2. Extensive  edema or hemorrhage into the subcutaneous fat at the lateral aspect of the left buttock and left hip. 3. Edema or hemorrhage into the subcutaneous fat at the lateral aspect of the left buttock and left hip.  TTE 1. The left ventricle has hyperdynamic systolic function, with an ejection fraction of >65%. The cavity size was normal. There is mildly increased left ventricular wall thickness. Left ventricular diastolic Doppler parameters are consistent with  impaired relaxation.  2. The right ventricle has normal systolic function. The cavity was normal. There is no increase in right ventricular wall thickness.  3. Trivial pericardial effusion is present.  4. The mitral valve is grossly normal. Moderate thickening of the mitral valve leaflet.  5. The tricuspid valve is grossly normal.  6. The aortic valve is grossly normal. Aortic valve regurgitation is trivial by color flow Doppler.  7. The aorta is normal unless otherwise noted.  8. The aortic root and ascending aorta are normal in size and structure.  9. The atrial septum is grossly normal.  MRI Brain  Motion degraded exam. No evidence of acute infarct. Lateral and third ventriculomegaly is similar to prior MRI 12/01/2013. This may reflect obstructive hydrocephalus at the level of the cerebral aqueduct. Redemonstrated asymmetric prominence of the right temporal horn with possible gray matter heterotopia lining the right temporal horn.  Mild chronic small vessel ischemic disease.   : CT ANGIOGRAPHY HEAD AND NECK   FINDINGS: CTA NECK FINDINGS  Aortic arch: Standard branching. The origin of the innominate artery and left common carotid artery are not included in the field of view. The right common carotid artery, right vertebral artery and left vertebral artery origins are patent without high-grade stenosis.  Right carotid system: The right common and cervical internal carotid arteries are patent without significant  stenosis (50% or greater).  Left carotid system: Beyond its nonvisualized origin, the left common carotid artery is patent without significant stenosis (50% or greater). The left cervical internal carotid artery is patent without significant stenosis (50% or greater).  Vertebral arteries: The bilateral vertebral arteries are patent within the neck without significant stenosis (50% or greater). The left vertebral artery is significantly dominant  Skeleton: Please refer to report for concurrent cervical spine CT for a description of bony findings.  Other neck: No soft tissue neck mass or pathologically enlarged cervical chain lymph nodes.  Upper chest: The imaged lung apices are clear.  Review of the MIP images confirms the above findings  CTA HEAD FINDINGS  Anterior circulation: The intracranial internal carotid artery arteries are patent without significant stenosis. The right middle and anterior cerebral arteries are patent without significant proximal stenosis. The left middle and anterior cerebral arteries are patent without significant proximal stenosis. No anterior circulation aneurysm is identified.  Posterior circulation: The right vertebral artery is significantly non dominant and diminutive beyond the origin of the right PICA. The distal right vertebral artery just proximal to the vertebrobasilar junction is poorly delineated and a stenosis at this site is difficult to exclude. The dominant left vertebral artery is widely patent, as is the basilar artery. Predominantly fetal origin of the right posterior cerebral artery which is patent without significant proximal stenosis. The left posterior cerebral artery is patent without significant proximal stenosis.  Venous sinuses: Limited of assessment due to arterial phase of contrast.  Anatomic variants: None significant  Review of the MIP images confirms the above findings  These results were  communicated to Dr. Laurence Slate At 3:43 pmon 8/20/2020by text page via the [facility] messaging system.   CTA HEAD:  The right vertebral artery is significantly non dominant, and diminutive beyond the origin of the right PICA. The distal right vertebral artery just proximal to the vertebrobasilar junction is poorly delineated and a stenosis at this site is difficult to exclude.  Otherwise, no evidence large vessel occlusion or proximal high-grade arterial stenosis within the head.  CTA NECK:  The origin of the right innominate and left common carotid arteries are not included in the field of view. Within this limitation, the bilateral common carotid, internal carotid and vertebral arteries are patent within the neck without significant stenosis (50% or greater).      Microbiology: None  Antimicrobials: Anti-infectives (From admission, onward)   None     Objective: Vitals:   06/18/19 0023 06/18/19 0329 06/18/19 0747 06/18/19 0834  BP: 121/80 (!) 127/95 (!) 153/104   Pulse: 68 99 (!) 109 98  Resp: 17 18 (!) 21 16  Temp: 99.3 F (37.4 C) 98.7 F (37.1 C) 98.2 F (36.8 C)   TempSrc: Oral Oral Oral   SpO2:      Weight:        Intake/Output Summary (Last 24 hours) at 06/18/2019 0928 Last data filed at 06/18/2019 0329 Gross per 24 hour  Intake 1278.47 ml  Output 250 ml  Net 1028.47 ml   Filed Weights   06/16/19 2029  Weight: 57.7 kg   Weight change:   Body mass index is 19.34 kg/m.  Intake/Output from previous day: 08/21 0701 - 08/22 0700 In: 1278.5 [I.V.:1176.1; IV Piggyback:102.4] Out: 375 [Urine:375] Intake/Output this shift: No intake/output data recorded.  Examination:  General exam: Appears calm, alert frail, thin, elderly, more perked up today, tremors in hands HEENT:PERRL,Oral mucosa DRY, Ear/Nose normal on gross exam Respiratory system: Bilateral equal air entry, normal vesicular breath sounds, no wheezes or crackles  Cardiovascular system: S1 &  S2 heard,No JVD, murmurs. Gastrointestinal system: Abdomen is  soft, non tender, non distended,BS+.  Nervous System:Alert. Moving arms, not conversing, tremors in hands. intemittently says name per RN. Extremities: Left hip with swelling edema, no clubbing, distal peripheral pulses palpable. Skin: No rashes, lesions,no icterus. MSK: Normal muscle bulk,tone,power.  Medications:  Scheduled Meds:  aspirin  81 mg Per Tube Daily   Or   aspirin  300 mg Rectal Daily   carbidopa-levodopa  1 tablet Oral TID   carbidopa-levodopa  1 tablet Oral Daily   cyanocobalamin  1,000 mcg Intramuscular Daily   enoxaparin (LOVENOX) injection  40 mg Subcutaneous Q24H   famotidine  20 mg Oral BID   Continuous Infusions:  0.45 % NaCl with KCl 20 mEq / L      Data Reviewed: I have personally reviewed following labs and imaging studies  CBC: Recent Labs  Lab 06/16/19 1431 06/16/19 1617 06/16/19 2200 06/18/19 0351  WBC 14.3*  --  12.5* 9.7  NEUTROABS 13.0*  --   --   --   HGB 14.2 15.0 15.8 13.4  HCT 42.4 44.0 45.3 39.4  MCV 97.2  --  94.4 97.0  PLT 190  --  195 160   Basic Metabolic Panel: Recent Labs  Lab 06/16/19 1431 06/16/19 1617 06/16/19 2200 06/18/19 0351  NA 144 144  --  149*  K 3.3* 3.7  --  3.4*  CL 105 103  --  116*  CO2 26  --   --  24  GLUCOSE 106* 100*  --  97  BUN 35* 37*  --  33*  CREATININE 1.08 1.00 0.99 0.88  CALCIUM 8.7*  --   --  8.4*   GFR: Estimated Creatinine Clearance: 62.8 mL/min (by C-G formula based on SCr of 0.88 mg/dL). Liver Function Tests: Recent Labs  Lab 06/16/19 1431  AST 192*  ALT 52*  ALKPHOS 40  BILITOT 2.2*  PROT 6.2*  ALBUMIN 3.8   No results for input(s): LIPASE, AMYLASE in the last 168 hours. Recent Labs  Lab 06/16/19 1602  AMMONIA 19   Coagulation Profile: Recent Labs  Lab 06/16/19 1431  INR 1.3*   Cardiac Enzymes: Recent Labs  Lab 06/16/19 1431 06/17/19 0207 06/18/19 0351  CKTOTAL 14,658* 14,870* 6,480*    BNP (last 3 results) No results for input(s): PROBNP in the last 8760 hours. HbA1C: Recent Labs    06/17/19 0207  HGBA1C 5.3   CBG: Recent Labs  Lab 06/16/19 2020  GLUCAP 93   Lipid Profile: Recent Labs    06/17/19 0207  CHOL 222*  HDL 67  LDLCALC 143*  TRIG 59  CHOLHDL 3.3   Thyroid Function Tests: Recent Labs    06/16/19 1602  TSH 0.977   Anemia Panel: Recent Labs    06/16/19 1602  VITAMINB12 83*   Sepsis Labs: No results for input(s): PROCALCITON, LATICACIDVEN in the last 168 hours.  Recent Results (from the past 240 hour(s))  SARS CORONAVIRUS 2 Nasal Swab Aptima Multi Swab     Status: None   Collection Time: 06/16/19  4:55 PM   Specimen: Aptima Multi Swab; Nasal Swab  Result Value Ref Range Status   SARS Coronavirus 2 NEGATIVE NEGATIVE Final    Comment: (NOTE) SARS-CoV-2 target nucleic acids are NOT DETECTED. The SARS-CoV-2 RNA is generally detectable in upper and lower respiratory specimens during the acute phase of infection. Negative results do not preclude SARS-CoV-2 infection, do not rule out co-infections with other pathogens, and should not be used as the sole basis for treatment or other patient management decisions. Negative results must be combined with clinical observations, patient history, and epidemiological information. The expected result is Negative. Fact Sheet for Patients: SugarRoll.be Fact Sheet for Healthcare Providers: https://www.woods-mathews.com/ This test is not yet approved or cleared by the [place] FDA and  has been authorized for detection and/or diagnosis of SARS-CoV-2 by FDA under an Emergency Use Authorization (EUA). This EUA will remain  in effect (meaning this test can be used) for the duration of the COVID-19 declaration under Section 56 4(b)(1) of the Act, 21 U.S.C. section 360bbb-3(b)(1), unless the authorization is terminated or revoked sooner. Performed at  [facility], [place] [address], [place], [place] [address]       Radiology Studies: Ct Angio Head W Or Wo Contrast  Result Date: 06/16/2019 CLINICAL DATA:  Focal neuro deficit, greater than 6 hours, stroke suspected EXAM: CT ANGIOGRAPHY HEAD AND NECK TECHNIQUE: Multidetector CT imaging of the head and neck was performed using the standard protocol during bolus administration of intravenous contrast. Multiplanar CT image reconstructions and MIPs were obtained to evaluate the vascular anatomy. Carotid stenosis measurements (when applicable) are obtained utilizing NASCET criteria, using the distal internal carotid diameter as the denominator. CONTRAST:  9mL OMNIPAQUE IOHEXOL 350 MG/ML SOLN COMPARISON:  Concurrent noncontrast head CT 06/16/2019,  brain MRI 12/01/2013, head CT 11/30/2013 FINDINGS: CTA NECK FINDINGS Aortic arch: Standard branching. The origin of the innominate artery and left common carotid artery are not included in the field of view. The right common carotid artery, right vertebral artery and left vertebral artery origins are patent without high-grade stenosis. Right carotid system: The right common and cervical internal carotid arteries are patent without significant stenosis (50% or greater). Left carotid system: Beyond its nonvisualized origin, the left common carotid artery is patent without significant stenosis (50% or greater). The left cervical internal carotid artery is patent without significant stenosis (50% or greater). Vertebral arteries: The bilateral vertebral arteries are patent within the neck without significant stenosis (50% or greater). The left vertebral artery is significantly dominant Skeleton: Please refer to report for concurrent cervical spine CT for a description of bony findings. Other neck: No soft tissue neck mass or pathologically enlarged cervical chain lymph nodes. Upper chest: The imaged lung apices are clear. Review of the MIP images confirms the above  findings CTA HEAD FINDINGS Anterior circulation: The intracranial internal carotid artery arteries are patent without significant stenosis. The right middle and anterior cerebral arteries are patent without significant proximal stenosis. The left middle and anterior cerebral arteries are patent without significant proximal stenosis. No anterior circulation aneurysm is identified. Posterior circulation: The right vertebral artery is significantly non dominant and diminutive beyond the origin of the right PICA. The distal right vertebral artery just proximal to the vertebrobasilar junction is poorly delineated and a stenosis at this site is difficult to exclude. The dominant left vertebral artery is widely patent, as is the basilar artery. Predominantly fetal origin of the right posterior cerebral artery which is patent without significant proximal stenosis. The left posterior cerebral artery is patent without significant proximal stenosis. Venous sinuses: Limited of assessment due to arterial phase of contrast. Anatomic variants: None significant Review of the MIP images confirms the above findings These results were communicated to Dr. Laurence Slate At 3:43 pmon 8/20/2020by text page via the [facility] messaging system. IMPRESSION: CTA HEAD: The right vertebral artery is significantly non dominant, and diminutive beyond the origin of the right PICA. The distal right vertebral artery just proximal to the vertebrobasilar junction is poorly delineated and a stenosis at this site is difficult to exclude. Otherwise, no evidence large vessel occlusion or proximal high-grade arterial stenosis within the head. CTA NECK: The origin of the right innominate and left common carotid arteries are not included in the field of view. Within this limitation, the bilateral common carotid, internal carotid and vertebral arteries are patent within the neck without significant stenosis (50% or greater). Electronically Signed   By: Jackey Loge   On:  06/16/2019 15:45   Ct Angio Neck W Or Wo Contrast  Result Date: 06/16/2019 CLINICAL DATA:  Focal neuro deficit, greater than 6 hours, stroke suspected EXAM: CT ANGIOGRAPHY HEAD AND NECK TECHNIQUE: Multidetector CT imaging of the head and neck was performed using the standard protocol during bolus administration of intravenous contrast. Multiplanar CT image reconstructions and MIPs were obtained to evaluate the vascular anatomy. Carotid stenosis measurements (when applicable) are obtained utilizing NASCET criteria, using the distal internal carotid diameter as the denominator. CONTRAST:  80mL OMNIPAQUE IOHEXOL 350 MG/ML SOLN COMPARISON:  Concurrent noncontrast head CT 06/16/2019, brain MRI 12/01/2013, head CT 11/30/2013 FINDINGS: CTA NECK FINDINGS Aortic arch: Standard branching. The origin of the innominate artery and left common carotid artery are not included in the field of view. The right common  carotid artery, right vertebral artery and left vertebral artery origins are patent without high-grade stenosis. Right carotid system: The right common and cervical internal carotid arteries are patent without significant stenosis (50% or greater). Left carotid system: Beyond its nonvisualized origin, the left common carotid artery is patent without significant stenosis (50% or greater). The left cervical internal carotid artery is patent without significant stenosis (50% or greater). Vertebral arteries: The bilateral vertebral arteries are patent within the neck without significant stenosis (50% or greater). The left vertebral artery is significantly dominant Skeleton: Please refer to report for concurrent cervical spine CT for a description of bony findings. Other neck: No soft tissue neck mass or pathologically enlarged cervical chain lymph nodes. Upper chest: The imaged lung apices are clear. Review of the MIP images confirms the above findings CTA HEAD FINDINGS Anterior circulation: The intracranial internal  carotid artery arteries are patent without significant stenosis. The right middle and anterior cerebral arteries are patent without significant proximal stenosis. The left middle and anterior cerebral arteries are patent without significant proximal stenosis. No anterior circulation aneurysm is identified. Posterior circulation: The right vertebral artery is significantly non dominant and diminutive beyond the origin of the right PICA. The distal right vertebral artery just proximal to the vertebrobasilar junction is poorly delineated and a stenosis at this site is difficult to exclude. The dominant left vertebral artery is widely patent, as is the basilar artery. Predominantly fetal origin of the right posterior cerebral artery which is patent without significant proximal stenosis. The left posterior cerebral artery is patent without significant proximal stenosis. Venous sinuses: Limited of assessment due to arterial phase of contrast. Anatomic variants: None significant Review of the MIP images confirms the above findings These results were communicated to Dr. Laurence Slate At 3:43 pmon 8/20/2020by text page via the [facility] messaging system. IMPRESSION: CTA HEAD: The right vertebral artery is significantly non dominant, and diminutive beyond the origin of the right PICA. The distal right vertebral artery just proximal to the vertebrobasilar junction is poorly delineated and a stenosis at this site is difficult to exclude. Otherwise, no evidence large vessel occlusion or proximal high-grade arterial stenosis within the head. CTA NECK: The origin of the right innominate and left common carotid arteries are not included in the field of view. Within this limitation, the bilateral common carotid, internal carotid and vertebral arteries are patent within the neck without significant stenosis (50% or greater). Electronically Signed   By: Jackey Loge   On: 06/16/2019 15:45   Ct Cervical Spine Wo Contrast  Result Date:  06/16/2019 CLINICAL DATA:  Trauma, found leading to left side with left arm numbness EXAM: CT CERVICAL SPINE WITHOUT CONTRAST TECHNIQUE: Multiplanar CT images of the cervical spine were reconstructed from previously performed CT angiography of the head and neck. COMPARISON:  06/16/2019 head CT FINDINGS: Alignment: No subluxation.  Facet alignment within normal limits. Skull base and vertebrae: No acute fracture. No primary bone lesion or focal pathologic process. Possible small hemangioma T1. Soft tissues and spinal canal: No prevertebral fluid or swelling. No visible canal hematoma. Disc levels: Partial fusion at C4-C5. Fusion of posterior facet at C4-C5. Advanced degenerative change C5-C6 and C6-C7. multiple level facet degenerative change. Upper chest: Lung apices clear Other: None IMPRESSION: Degenerative changes of the cervical spine without definite acute osseous abnormality. Findings for the CT angiogram of the head and neck are dictated in a separate report. Electronically Signed   By: Jasmine Pang M.D.   On: 06/16/2019 15:25  Mr Brain Wo Contrast  Result Date: 06/16/2019 CLINICAL DATA:  Stroke, follow-up EXAM: MRI HEAD WITHOUT CONTRAST TECHNIQUE: Multiplanar, multiecho pulse sequences of the brain and surrounding structures were obtained without intravenous contrast. COMPARISON:  Non-contrast CT head and CT angiogram head/neck performed earlier the same day 06/16/2019, brain MRI 12/01/2013 FINDINGS: Brain: Multiple sequences are motion degraded, most notably the axial acquired T2 weighted and T2 weighted FLAIR sequences. This limits evaluation. No evidence of acute infarct. No evidence of intracranial mass. No midline shift or extra-axial collection. No chronic intracranial hemorrhage. Lateral and third ventriculomegaly is similar to prior brain MRI 12/01/2013. Redemonstrated asymmetric prominence of the right temporal horn with possible gray matter heterotopia lining the right temporal horn. Mild  scattered T2/FLAIR hyperintensity within the cerebral white matter is nonspecific, but consistent with chronic small vessel ischemic disease. Mild generalized parenchymal atrophy. Vascular: Flow voids maintained within the proximal large vessels. Prominent arachnoid granulation within the right transverse sinus. Skull and upper cervical spine: Normal marrow signal. Sinuses/Orbits: Imaged globes and orbits demonstrate no acute abnormality. Redemonstrated cyst surrounding multiple right maxillary teeth, encroaching upon the right maxillary sinus. Small right maxillary sinus mucous retention cyst. Mild ethmoid sinus mucosal thickening. No significant mastoid effusion. IMPRESSION: Motion degraded exam. No evidence of acute infarct. Lateral and third ventriculomegaly is similar to prior MRI 12/01/2013. This may reflect obstructive hydrocephalus at the level of the cerebral aqueduct. Redemonstrated asymmetric prominence of the right temporal horn with possible gray matter heterotopia lining the right temporal horn. Mild chronic small vessel ischemic disease. Electronically Signed   By: Jackey LogeKyle  Golden   On: 06/16/2019 17:45   Ct Hip Left Wo Contrast  Result Date: 06/17/2019 CLINICAL DATA:  Right hip swelling. The patient was found down. EXAM: CT OF THE LEFT HIP WITHOUT CONTRAST TECHNIQUE: Multidetector CT imaging of the left hip was performed according to the standard protocol. Multiplanar CT image reconstructions were also generated. COMPARISON:  None. FINDINGS: Bones/Joint/Cartilage There is no fracture or dislocation or significant arthritis at the left hip. The visualized pelvic bones are intact. There is a minimal left hip effusion. Muscles and Tendons A there is abnormal low-density in the superolateral aspect of the left gluteus maximus muscle with extensive edema or hemorrhage into the subcutaneous fat at the lateral aspect of the left buttock and left hip. No defined hematoma. The other muscles of the left  buttock and left hip appear normal. Soft tissues Extensive edema or hemorrhage into the subcutaneous fat at the lateral aspect of the left buttock and left hip. No defined hematoma. IMPRESSION: 1. No acute osseous abnormality of the left hip. 2. Extensive edema or hemorrhage into the subcutaneous fat at the lateral aspect of the left buttock and left hip. 3. Edema or hemorrhage into the subcutaneous fat at the lateral aspect of the left buttock and left hip. Electronically Signed   By: Francene BoyersJames  Maxwell M.D.   On: 06/17/2019 13:29   Dg Chest Portable 1 View  Result Date: 06/16/2019 CLINICAL DATA:  Fall. EXAM: PORTABLE CHEST 1 VIEW COMPARISON:  11/30/2013 FINDINGS: Patient is rotated to the left. Mild elevation of the left hemidiaphragm with moderate consolidation over the lateral left base which may be due to pulmonary contusion versus effusion and atelectasis. Right lung is clear. No pneumothorax. Cardiomediastinal silhouette is unremarkable. No acute fracture visualized. IMPRESSION: Opacification over the lateral left base which may be due to pulmonary contusion versus effusion with atelectasis. Electronically Signed   By: Elberta Fortisaniel  Boyle M.D.  On: 06/16/2019 16:15   Ct Head Code Stroke Wo Contrast  Result Date: 06/16/2019 CLINICAL DATA:  Code stroke. Additional history provided: Patient arrives via EMS found by neighbor sitting on toilet leaned over toward the left side with neck holding up patient, left arm dangling. Left arm was numb on EMS arrival and now feels like pins and needles. EXAM: CT HEAD WITHOUT CONTRAST TECHNIQUE: Contiguous axial images were obtained from the base of the skull through the vertex without intravenous contrast. COMPARISON:  Brain MRI 12/01/2013, head CT 11/30/2013 FINDINGS: Brain: Streak artifact limits evaluation. Within this limitation, no evidence of acute demarcated territorial infarction. No evidence of acute intracranial hemorrhage. Redemonstrated lateral and third  ventriculomegaly with asymmetric prominence of the right temporal horn. Wallace CullensGray matter heterotopia lining the right temporal horn was better appreciated on prior MRI 12/01/2013. No midline shift or extra-axial collection. Ill-defined hypoattenuation of the cerebral white matter is nonspecific, but consistent with chronic small vessel ischemic disease Vascular: No definite hyperdense vessel. Atherosclerotic calcification of the carotid artery siphons and vertebrobasilar system Skull: No calvarial fracture. Imaged globes and orbits demonstrate no acute abnormality. Sinuses/Orbits: There is a large cyst surrounding right-sided maxillary teeth encroaching upon the right maxillary sinus. The visualized paranasal sinuses are otherwise well aerated. No significant mastoid effusion ASPECTS (Alberta Stroke Program Early CT Score) - Ganglionic level infarction (caudate, lentiform nuclei, internal capsule, insula, M1-M3 cortex): 7 - Supraganglionic infarction (M4-M6 cortex): 3 Total score (0-10 with 10 being normal): 10 These results were communicated to Dr. Laurence SlateAroor At 2:59 pmon 8/20/2020by text page via the [facility]. IMPRESSION: Examination limited by streak artifact. No evidence of acute demarcated territorial infarction or acute intracranial hemorrhage. ASPECTS 10. Lateral and third ventriculomegaly is similar to prior brain MRI 12/01/2013. Wallace CullensGray matter heterotopia lining the right temporal horn was better appreciated on this prior exam. Cyst surrounding multiple right-sided maxillary teeth encroaching upon the right maxillary sinus. Electronically Signed   By: Jackey LogeKyle  Golden   On: 06/16/2019 15:03      LOS: 2 days   Time spent: More than 50% of that time was spent in counseling and/or coordination of care.  Lanae Boastamesh Tumeka Chimenti, MD [facility]  06/18/2019, 9:28 AM

## 2019-06-18 NOTE — TOC Initial Note (Signed)
Transition of Care [facility](TOC) - Initial/Assessment Note    Patient Details  Name: Kevin Gould MRN: [id number] Date of Birth: 06/19/1946  Transition of Care [facility](TOC) CM/SW Contact:    Kevin Gould, LCSWA Phone Number: 06/18/2019, 11:34 AM  Clinical Narrative:                  CSW called and spoke with the patient's cousin, Kevin JarvisChristine Gould. [place] and is the next of kin for the patient. CSW introduced herself and explained her role. CSW shared the therapy recommendation. She stated that she is concerned about her cousin because of his dementia. She stated that his friend, Kevin Gould helps take care of him and brings him food. She stated that the patient has refused going to a facility for years. She is concerned that he is non-compliant with his medications and not going to the doctor. She stated that he would be mad at her but she thinks it would be best for him. She stated that she is also concerned about the patient having hallucinations.   She stated that he lives in a hoarding situation. She stated that he fell over some boxes and cannot get his walker through his home. She stated that EMS couldn't even get the stretcher through the home. CSW asked if she felt comfortable sending the patient to SNF.She agreed. CSW obtained her email address and sent the SNF list to her. CSW stated that she would call and make an APS report on the patient's behalf.   CSW will continue to follow and assist with disposition.    Expected Discharge Plan: Skilled Nursing Facility Barriers to Discharge: Insurance Authorization   Patient Goals and CMS Choice Patient states their goals for this hospitalization and ongoing recovery are:: Pt cousin feels that it is not safe for him to return home. CMS Medicare.gov Compare Post Acute Care list provided to:: Patient Represenative (must comment)(Pt Cousin)    Expected Discharge Plan and Services Expected Discharge Plan: Skilled Nursing  Facility In-house Referral: Clinical Social Work Discharge Planning Services: NA Post Acute Care Choice: Skilled Nursing Facility Living arrangements for the past 2 months: Single Family Home                 DME Arranged: N/A DME Agency: NA       HH Arranged: NA HH Agency: NA        Prior Living Arrangements/Services Living arrangements for the past 2 months: Single Family Home Lives with:: Self Patient language and need for interpreter reviewed:: No Do you feel safe going back to the place where you live?: No   Pt cannot live alone anymore  Need for Family Participation in Patient Care: Yes (Comment) Care giver support system in place?: Yes (comment)   Criminal Activity/Legal Involvement Pertinent to Current Situation/Hospitalization: No - Comment as needed  Activities of Daily Living      Permission Sought/Granted Permission sought to share information with : Case Manager Permission granted to share information with : Yes, Verbal Permission Granted  Share Information with NAME: Kevin Gould & Kevin Gould  Permission granted to share info w AGENCY: All SNF  Permission granted to share info w Relationship: Cousin/next of kin & Friend/Neighbor     Emotional Assessment Appearance:: Appears stated age Attitude/Demeanor/Rapport: Unable to Assess Affect (typically observed): Unable to Assess Orientation: : Oriented to Self Alcohol / Substance Use: Not Applicable Psych Involvement: No (comment)  Admission diagnosis:  Dehydration [E86.0] Left-sided weakness [R53.1] Generalized  weakness [R53.1] Fall in home, initial encounter [W19.XXXA, L93.790] Non-traumatic rhabdomyolysis [M62.82] Rhabdomyolysis [M62.82] Patient Active Problem List   Diagnosis Date Noted  . Parkinson's disease ([place]) 12/25/2014  . Hydrocephalus, acquired ([place]) 12/25/2014  . Rhabdomyolysis 11/30/2013  . Hypotension 11/30/2013  . Frequent falls 11/30/2013  . Memory problem 11/30/2013  . CERUMEN IMPACTION,  BILATERAL 12/11/2010  . Essential hypertension 01/23/2009  . HYPERCHOLESTEROLEMIA 07/25/2008  . NEUROPATHY 07/17/2008  . LOW BACK PAIN, MILD 07/17/2008  . INSOMNIA 07/17/2008  . GERD 08/27/2004  . PARESTHESIA 09/03/1994   PCP:  Kevin Contras, MD Pharmacy:   [facility] [address], [place] [address] Phone: [phone] Fax: [phone]     Social Determinants of Health (SDOH) Interventions    Readmission Risk Interventions No flowsheet data found.

## 2019-06-19 ENCOUNTER — Inpatient Hospital Stay (HOSPITAL_COMMUNITY): Payer: Medicare Other

## 2019-06-19 DIAGNOSIS — L89221 Pressure ulcer of left hip, stage 1: Secondary | ICD-10-CM

## 2019-06-19 DIAGNOSIS — L8990 Pressure ulcer of unspecified site, unspecified stage: Secondary | ICD-10-CM | POA: Insufficient documentation

## 2019-06-19 DIAGNOSIS — E870 Hyperosmolality and hypernatremia: Secondary | ICD-10-CM

## 2019-06-19 LAB — BASIC METABOLIC PANEL
Anion gap: 10 (ref 5–15)
BUN: 23 mg/dL (ref 8–23)
CO2: 23 mmol/L (ref 22–32)
Calcium: 8.6 mg/dL — ABNORMAL LOW (ref 8.9–10.3)
Chloride: 117 mmol/L — ABNORMAL HIGH (ref 98–111)
Creatinine, Ser: 0.77 mg/dL (ref 0.61–1.24)
GFR calc Af Amer: >60 mL/min (ref >60)
GFR calc non Af Amer: >60 mL/min (ref >60)
Glucose, Bld: 96 mg/dL (ref 70–99)
Potassium: 3.6 mmol/L (ref 3.5–5.1)
Sodium: 150 mmol/L — ABNORMAL HIGH (ref 135–145)

## 2019-06-19 LAB — CBC
HCT: 42.2 % (ref 39.0–52.0)
Hemoglobin: 13.8 g/dL (ref 13.0–17.0)
MCH: 32.2 pg (ref 26.0–34.0)
MCHC: 32.7 g/dL (ref 30.0–36.0)
MCV: 98.4 fL (ref 80.0–100.0)
Platelets: 174 10*3/uL (ref 150–400)
RBC: 4.29 MIL/uL (ref 4.22–5.81)
RDW: 13.6 % (ref 11.5–15.5)
WBC: 10.4 10*3/uL (ref 4.0–10.5)
nRBC: 0 % (ref 0.0–0.2)

## 2019-06-19 LAB — CK: Total CK: 4080 U/L — ABNORMAL HIGH (ref 49–397)

## 2019-06-19 LAB — URINE CULTURE

## 2019-06-19 MED ORDER — DEXTROSE 5 % IV SOLN
INTRAVENOUS | Status: DC
  Administered 2019-06-19 – 2019-06-20 (×2): via INTRAVENOUS
  Administered 2019-06-21: 11:00:00 75 mL via INTRAVENOUS
  Administered 2019-06-22 (×2): via INTRAVENOUS

## 2019-06-19 NOTE — Progress Notes (Signed)
PROGRESS NOTE    Kevin Gould  ZOX:[id number] DOB: 12/16/1945 DOA: 06/16/2019 PCP: [name], [name], MD   Brief Narrative: As per HPI: 73 y.o. male, with  history of Parkinson disease, hypertension, hyperlipidemia, headache, multiple falls, dementia, and lives home by himself, no close family members, been taking care of by his friends and neighbors, apparently patient lives in a very unhealthy environment, house completely disheveled, records almost looks like hoarders house, patient was on the floor for unknown period of time, apparently he was found in the bathroom floor by his neighbor when he came to check on him, friends report patient has been falling very frequently recently, not taking his medication and very stiff, patient was noted to have some left-sided weakness per ED and code stroke called on admission. In ER: No evidence of acute CVA, work-up significant for low B12 at 83, total CK at 14,658, creatinine at 1.08, mild elevated white blood cell count at 14.3. Patient remains very weak, frail, appearing alert awake and presentation.  His CK is downtrending on IV fluid.  Subjective:  More alert awake but is still not able to converse.  Not able to tell me his name, especially muffled voice. Has remained afebrile. More alert and awake this am. Does not converse or follow commands, speaking in muffled voice , when asked how are you states "It started up well and ended up well" in very faint voice, barely audible. On RA. Tmax 101 yesterday night.  Assessment & Plan:  Rhabdomyolysis: Likely atraumatic from laying on the floor.  Unknown circumstance, patient was found on the floor for a few hours.  Total CK at 14,000-->6400->4k, cont ivf change to d5w as sodium and chloride creeping up.  Left hip pressure injury stage I with intact skin non-blanchable redness localized area  POA -CT scan shows extensive edema or hemorrhage in the subcutaneous fat. leukocytosis downtrending without  antibiotics.  However patient had temp spikE 8/21- CONT doxycyclineX3 DAYS.  Continue wound care for the pressure ulcer.  Fever episode-:UA unremarkable CXR on admittions "Opacification over the lateral left base which may be due topulmonary contusion versus effusion with atelectasis.". on RA. Initial leucocytosis resolve with ivf. Does have area of skin of the left lateral that is red and swollen- Cont  doxycycline to cover for cellulitis x 3 days. Recent Labs  Lab 06/16/19 1431 06/16/19 2200 06/18/19 0351 06/19/19 0554  WBC 14.3* 12.5* 9.7 10.4   Hypokalemia-cont with kcl in ivf.  Hyponatremia likely: From combination of saline hydration as well as decreased oral intake.  We will continue D5W repeat BMP in a.m.  He has passed speech on dysphagia 1 diet with nectar thick.  Failure to thrive patient extremely cachectic and also confused.  Passed swallow eval and placed on diet, continue with aspiration precaution and assistance.    Acute encephalopathy likely metabolic in the setting of rhabdomyolysis possible fall also with failure to thrive, likely not taking his Parkinson's medication.Patient had extensive work-up on admission with CT head CT spine and CT angiogram head and neck and MRI brain . No acute stroke.  He appears much more alert awake but he still not conversant, not following commands. Continue PT OT, continue supportive care will need placement.  Parkinson disease likely not taking medication patient having tremors.  Resumed home meds as passed swallow eval. hopefully tremors will improve.  B12 deficiency : Started on vitamin B12 replacement on admission.    Hypernatremia- sodium at 150- cont on   Severe protein  calorie malnutrition: Continue to augment nutrition consult dietitian.  R/O  TIA: No acute stroke on MRI brain.  Unclear if this is TIA or from not taking his medication.  2D echo pending carotid was unremarkable on CTA neck so hold off carotid  Duplex. hba1c <6,  ldl 143- but statins contraindicated due to rhabdo.  Frequent falls: PT OT to continue  GERD: add Pepcid.  Essential hypertension: Pressure fairly stable.   Body mass index is 19.34 kg/m.   DVT prophylaxis: lovenox Code Status: full Family Communication:none at bedside. Called Christine (relative) to update- no answer. Disposition Plan: Remains inpatient pending clinical improvement.  Consultants:  Neurology  Procedures:  CT left hip without contrast: 1. No acute osseous abnormality of the left hip. 2. Extensive edema or hemorrhage into the subcutaneous fat at the lateral aspect of the left buttock and left hip. 3. Edema or hemorrhage into the subcutaneous fat at the lateral aspect of the left buttock and left hip.  TTE 1. The left ventricle has hyperdynamic systolic function, with an ejection fraction of >65%. The cavity size was normal. There is mildly increased left ventricular wall thickness. Left ventricular diastolic Doppler parameters are consistent with  impaired relaxation.  2. The right ventricle has normal systolic function. The cavity was normal. There is no increase in right ventricular wall thickness.  3. Trivial pericardial effusion is present.  4. The mitral valve is grossly normal. Moderate thickening of the mitral valve leaflet.  5. The tricuspid valve is grossly normal.  6. The aortic valve is grossly normal. Aortic valve regurgitation is trivial by color flow Doppler.  7. The aorta is normal unless otherwise noted.  8. The aortic root and ascending aorta are normal in size and structure.  9. The atrial septum is grossly normal.  MRI Brain  Motion degraded exam. No evidence of acute infarct. Lateral and third ventriculomegaly is similar to prior MRI 12/01/2013. This may reflect obstructive hydrocephalus at the level of the cerebral aqueduct. Redemonstrated asymmetric prominence of the right temporal horn with possible gray matter heterotopia lining  the right temporal horn.  Mild chronic small vessel ischemic disease.   : CT ANGIOGRAPHY HEAD AND NECK   FINDINGS: CTA NECK FINDINGS  Aortic arch: Standard branching. The origin of the innominate artery and left common carotid artery are not included in the field of view. The right common carotid artery, right vertebral artery and left vertebral artery origins are patent without high-grade stenosis.  Right carotid system: The right common and cervical internal carotid arteries are patent without significant stenosis (50% or greater).  Left carotid system: Beyond its nonvisualized origin, the left common carotid artery is patent without significant stenosis (50% or greater). The left cervical internal carotid artery is patent without significant stenosis (50% or greater).  Vertebral arteries: The bilateral vertebral arteries are patent within the neck without significant stenosis (50% or greater). The left vertebral artery is significantly dominant  Skeleton: Please refer to report for concurrent cervical spine CT for a description of bony findings.  Other neck: No soft tissue neck mass or pathologically enlarged cervical chain lymph nodes.  Upper chest: The imaged lung apices are clear.  Review of the MIP images confirms the above findings  CTA HEAD FINDINGS  Anterior circulation: The intracranial internal carotid artery arteries are patent without significant stenosis. The right middle and anterior cerebral arteries are patent without significant proximal stenosis. The left middle and anterior cerebral arteries are patent without significant proximal stenosis. No  anterior circulation aneurysm is identified.  Posterior circulation: The right vertebral artery is significantly non dominant and diminutive beyond the origin of the right PICA. The distal right vertebral artery just proximal to the vertebrobasilar junction is poorly delineated and a  stenosis at this site is difficult to exclude. The dominant left vertebral artery is widely patent, as is the basilar artery. Predominantly fetal origin of the right posterior cerebral artery which is patent without significant proximal stenosis. The left posterior cerebral artery is patent without significant proximal stenosis.  Venous sinuses: Limited of assessment due to arterial phase of contrast.  Anatomic variants: None significant  Review of the MIP images confirms the above findings  These results were communicated to Dr. Laurence SlateAroor At 3:43 pmon 8/20/2020by text page via the [facility] messaging system.   CTA HEAD:  The right vertebral artery is significantly non dominant, and diminutive beyond the origin of the right PICA. The distal right vertebral artery just proximal to the vertebrobasilar junction is poorly delineated and a stenosis at this site is difficult to exclude.  Otherwise, no evidence large vessel occlusion or proximal high-grade arterial stenosis within the head.  CTA NECK:  The origin of the right innominate and left common carotid arteries are not included in the field of view. Within this limitation, the bilateral common carotid, internal carotid and vertebral arteries are patent within the neck without significant stenosis (50% or greater).      Microbiology: None  Antimicrobials: Anti-infectives (From admission, onward)   Start     Dose/Rate Route Frequency Ordered Stop   06/18/19 1200  doxycycline (VIBRAMYCIN) 100 mg in sodium chloride 0.9 % 250 mL IVPB     100 mg 125 mL/hr over 120 Minutes Intravenous Every 12 hours 06/18/19 1145 06/21/19 1144     Objective: Vitals:   06/18/19 2325 06/19/19 0352 06/19/19 0740 06/19/19 0800  BP: (!) 160/102 (!) 153/85  (!) 156/90  Pulse: 85 62 90 88  Resp: 16 15 14 16   Temp: 98.9 F (37.2 C) 98 F (36.7 C) 98 F (36.7 C)   TempSrc: Oral Oral Axillary   SpO2: 96% 96% 96%   Weight:         Intake/Output Summary (Last 24 hours) at 06/19/2019 1033 Last data filed at 06/19/2019 0136 Gross per 24 hour  Intake 1835.69 ml  Output -  Net 1835.69 ml   Filed Weights   06/16/19 2029  Weight: 57.7 kg   Weight change:   Body mass index is 19.34 kg/m.  Intake/Output from previous day: 08/22 0701 - 08/23 0700 In: 1935.7 [P.O.:100; I.V.:1321.9; IV Piggyback:513.8] Out: -  Intake/Output this shift: No intake/output data recorded.  Examination:  General exam: Appears alert awake not participating in conversation speaking muffled voice.   HEENT:PERRL,Oral mucosa dry ear nose normal exam  Respiratory system: Bilateral diminished breath sounds no wheezing or crackles.   Cardiovascular system: S1 & S2 heard,No JVD, murmurs. Gastrointestinal system: Abdomen is  soft, non tender, non distended,BS+.  Nervous System:Alert. Moving arms, not conversing, tremors in hands. intemittently able to say his name to staff. Extremities: Left hip with w) 1.  Swelling edema, no clubbing, distal peripheral pulses palpable. Skin: No rashes, lesions,no icterus. MSK: Normal muscle bulk,tone,power.  Medications:  Scheduled Meds: . aspirin  81 mg Per Tube Daily   Or  . aspirin  300 mg Rectal Daily  . carbidopa-levodopa  1 tablet Oral TID  . carbidopa-levodopa  1 tablet Oral Daily  . cyanocobalamin  1,000 mcg  Intramuscular Daily  . enoxaparin (LOVENOX) injection  40 mg Subcutaneous Q24H  . famotidine  20 mg Oral BID   Continuous Infusions: . 0.45 % NaCl with KCl 20 mEq / L 100 mL/hr at 06/19/19 0136  . doxycycline (VIBRAMYCIN) IV Stopped (06/19/19 0134)    Data Reviewed: I have personally reviewed following labs and imaging studies  CBC: Recent Labs  Lab 06/16/19 1431 06/16/19 1617 06/16/19 2200 06/18/19 0351 06/19/19 0554  WBC 14.3*  --  12.5* 9.7 10.4  NEUTROABS 13.0*  --   --   --   --   HGB 14.2 15.0 15.8 13.4 13.8  HCT 42.4 44.0 45.3 39.4 42.2  MCV 97.2  --  94.4 97.0 98.4   PLT 190  --  195 160 599   Basic Metabolic Panel: Recent Labs  Lab 06/16/19 1431 06/16/19 1617 06/16/19 2200 06/18/19 0351 06/19/19 0554  NA 144 144  --  149* 150*  K 3.3* 3.7  --  3.4* 3.6  CL 105 103  --  116* 117*  CO2 26  --   --  24 23  GLUCOSE 106* 100*  --  97 96  BUN 35* 37*  --  33* 23  CREATININE 1.08 1.00 0.99 0.88 0.77  CALCIUM 8.7*  --   --  8.4* 8.6*   GFR: Estimated Creatinine Clearance: 69.1 mL/min (by C-G formula based on SCr of 0.77 mg/dL). Liver Function Tests: Recent Labs  Lab 06/16/19 1431  AST 192*  ALT 52*  ALKPHOS 40  BILITOT 2.2*  PROT 6.2*  ALBUMIN 3.8   No results for input(s): LIPASE, AMYLASE in the last 168 hours. Recent Labs  Lab 06/16/19 1602  AMMONIA 19   Coagulation Profile: Recent Labs  Lab 06/16/19 1431  INR 1.3*   Cardiac Enzymes: Recent Labs  Lab 06/16/19 1431 06/17/19 0207 06/18/19 0351 06/19/19 0554  CKTOTAL 14,658* 14,870* 6,480* 4,080*   BNP (last 3 results) No results for input(s): PROBNP in the last 8760 hours. HbA1C: Recent Labs    06/17/19 0207  HGBA1C 5.3   CBG: Recent Labs  Lab 06/16/19 2020  GLUCAP 93   Lipid Profile: Recent Labs    06/17/19 0207  CHOL 222*  HDL 67  LDLCALC 143*  TRIG 59  CHOLHDL 3.3   Thyroid Function Tests: Recent Labs    06/16/19 1602  TSH 0.977   Anemia Panel: Recent Labs    06/16/19 1602  VITAMINB12 83*   Sepsis Labs: No results for input(s): PROCALCITON, LATICACIDVEN in the last 168 hours.  Recent Results (from the past 240 hour(s))  SARS CORONAVIRUS 2 Nasal Swab Aptima Multi Swab     Status: None   Collection Time: 06/16/19  4:55 PM   Specimen: Aptima Multi Swab; Nasal Swab  Result Value Ref Range Status   SARS Coronavirus 2 NEGATIVE NEGATIVE Final    Comment: (NOTE) SARS-CoV-2 target nucleic acids are NOT DETECTED. The SARS-CoV-2 RNA is generally detectable in upper and lower respiratory specimens during the acute phase of infection. Negative  results do not preclude SARS-CoV-2 infection, do not rule out co-infections with other pathogens, and should not be used as the sole basis for treatment or other patient management decisions. Negative results must be combined with clinical observations, patient history, and epidemiological information. The expected result is Negative. Fact Sheet for Patients: SugarRoll.be Fact Sheet for Healthcare Providers: https://www.woods-mathews.com/ This test is not yet approved or cleared by the [place] FDA and  has been authorized for  detection and/or diagnosis of SARS-CoV-2 by FDA under an Emergency Use Authorization (EUA). This EUA will remain  in effect (meaning this test can be used) for the duration of the COVID-19 declaration under Section 56 4(b)(1) of the Act, 21 U.S.C. section 360bbb-3(b)(1), unless the authorization is terminated or revoked sooner. Performed at [facility], [address] [place], [place] [address]   Culture, Urine     Status: None   Collection Time: 06/17/19  6:01 PM   Specimen: Urine, Random  Result Value Ref Range Status   Specimen Description URINE, RANDOM  Final   Special Requests   Final    NONE Performed at [facility], [address] [place], [place] [address]    Culture   Final    Multiple bacterial morphotypes present, none predominant. Suggest appropriate recollection if clinically indicated.   Report Status 06/19/2019 FINAL  Final      Radiology Studies: Ct Hip Left Wo Contrast  Result Date: 06/17/2019 CLINICAL DATA:  Right hip swelling. The patient was found down. EXAM: CT OF THE LEFT HIP WITHOUT CONTRAST TECHNIQUE: Multidetector CT imaging of the left hip was performed according to the standard protocol. Multiplanar CT image reconstructions were also generated. COMPARISON:  None. FINDINGS: Bones/Joint/Cartilage There is no fracture or dislocation or significant arthritis at the left  hip. The visualized pelvic bones are intact. There is a minimal left hip effusion. Muscles and Tendons A there is abnormal low-density in the superolateral aspect of the left gluteus maximus muscle with extensive edema or hemorrhage into the subcutaneous fat at the lateral aspect of the left buttock and left hip. No defined hematoma. The other muscles of the left buttock and left hip appear normal. Soft tissues Extensive edema or hemorrhage into the subcutaneous fat at the lateral aspect of the left buttock and left hip. No defined hematoma. IMPRESSION: 1. No acute osseous abnormality of the left hip. 2. Extensive edema or hemorrhage into the subcutaneous fat at the lateral aspect of the left buttock and left hip. 3. Edema or hemorrhage into the subcutaneous fat at the lateral aspect of the left buttock and left hip. Electronically Signed   By: Francene BoyersJames  Maxwell M.D.   On: 06/17/2019 13:29      LOS: 3 days   Time spent: More than 50% of that time was spent in counseling and/or coordination of care.  Lanae Boastamesh Maleeyah Mccaughey, MD [facility]  06/19/2019, 10:33 AM

## 2019-06-19 NOTE — Progress Notes (Signed)
Modified Barium Swallow Progress Note  Patient Details  Name: Kevin Gould MRN: [id number] Date of Birth: [birth year]  Today's Date: 06/19/2019  Modified Barium Swallow completed.  Full report located under Chart Review in the Imaging Section.  Brief recommendations include the following:  Clinical Impression  Patient presents with a moderate dysphagia with oral and pharyngeal components. Orally, patient with prolonged oral transit, particularly of solid boluses (regular > soft/pureed). Notable delay in swallow initiation occurring across consistencies, in most cases to the vallecula allowing for time to swallow and protect airway with solids and nectar thick liquids. With thin liquids however, swallow initiated once bolus hits the pyriform sinuses, with aspiration occurring either before or during the swallow.  Did attempt to manipulate head position to neutral in attempts to decrease aspiration, as patient keeps head hyperextended at baseline, however unsuccessful.  Moderate vallecular and mild pyriform sinus residuals remain post swallow across consistencies further increasing aspiration risk. Although aspiration risk is mild-moderate with any pos at this time, continue to recommend nectar thick liquids, with dysphagia 2 solids, as least restrictive option. Full supervision will be required.  Prognosis for improvement guarded as given history of Parkinson's, patient likely with at least some degree of baseline swallowing dysfunction. Some improvement may be seen with improving acute condition in general however, Will continue to f/u.    Swallow Evaluation Recommendations       SLP Diet Recommendations: Dysphagia 2 (Fine chop) solids;Nectar thick liquid   Liquid Administration via: Cup;No straw   Medication Administration: Crushed with puree   Supervision: Patient able to self feed;Full supervision/cueing for compensatory strategies   Compensations: Slow rate;Small sips/bites   Postural Changes: Seated upright at 90 degrees   Oral Care Recommendations: Oral care BID   Other Recommendations: Order thickener from [place] MA, CCC-SLP   Tana Trefry Meryl 06/19/2019,11:54 AM

## 2019-06-20 DIAGNOSIS — I959 Hypotension, unspecified: Secondary | ICD-10-CM

## 2019-06-20 LAB — BASIC METABOLIC PANEL
Anion gap: 8 (ref 5–15)
BUN: 19 mg/dL (ref 8–23)
CO2: 24 mmol/L (ref 22–32)
Calcium: 8.6 mg/dL — ABNORMAL LOW (ref 8.9–10.3)
Chloride: 115 mmol/L — ABNORMAL HIGH (ref 98–111)
Creatinine, Ser: 0.77 mg/dL (ref 0.61–1.24)
GFR calc Af Amer: >60 mL/min (ref >60)
GFR calc non Af Amer: >60 mL/min (ref >60)
Glucose, Bld: 130 mg/dL — ABNORMAL HIGH (ref 70–99)
Potassium: 3.2 mmol/L — ABNORMAL LOW (ref 3.5–5.1)
Sodium: 147 mmol/L — ABNORMAL HIGH (ref 135–145)

## 2019-06-20 LAB — CBC
HCT: 40 % (ref 39.0–52.0)
Hemoglobin: 13.4 g/dL (ref 13.0–17.0)
MCH: 32.4 pg (ref 26.0–34.0)
MCHC: 33.5 g/dL (ref 30.0–36.0)
MCV: 96.9 fL (ref 80.0–100.0)
Platelets: 172 10*3/uL (ref 150–400)
RBC: 4.13 MIL/uL — ABNORMAL LOW (ref 4.22–5.81)
RDW: 13.2 % (ref 11.5–15.5)
WBC: 10.3 10*3/uL (ref 4.0–10.5)
nRBC: 0 % (ref 0.0–0.2)

## 2019-06-20 LAB — VITAMIN B1: Vitamin B1 (Thiamine): 155.6 nmol/L (ref 66.5–200.0)

## 2019-06-20 LAB — CK: Total CK: 2776 U/L — ABNORMAL HIGH (ref 49–397)

## 2019-06-20 MED ORDER — POTASSIUM CHLORIDE CRYS ER 20 MEQ PO TBCR
40.0000 meq | EXTENDED_RELEASE_TABLET | ORAL | Status: AC
  Administered 2019-06-20 (×2): 40 meq via ORAL
  Filled 2019-06-20 (×2): qty 2

## 2019-06-20 NOTE — Consult Note (Signed)
[facility] Nurse wound consult note Reason for Consult: left hip injury Patient down at home, documented at Stage 1 Pressure injuyr Wound type: pressure injury with no disruption in the skin integrity; Stage 1  Pressure Injury POA: Yes CT scan: shows extensive edema or hemorrhage in the subcutaneous fat.  This may evolve to deep tissue injury; beside nurse reports skin is just red and is not dark purple.  Measurement: see nursing notes (6cm x 5cm x 0cm) Wound bed: red, non blanchable skin  Drainage (amount, consistency, odor) none Periwound: I intact  Dressing procedure/placement/frequency: Continue silicone foam per skin care order set that has been implemented at the time of admission. Turn patient from side to side as scheduled, monitor for any skin changes in this area.   Discussed POC with bedside nurse.  Re consult if needed, will not follow at this time. Thanks  Chandler Swiderski [employer], RN,CWOCN, CNS, [place] [id number])

## 2019-06-20 NOTE — Care Management Important Message (Signed)
Important Message  Patient Details  Name: Kevin Gould MRN: [id number] Date of Birth: 01-20-1946   Medicare Important Message Given:  Yes     [name] 06/20/2019, 3:24 PM

## 2019-06-20 NOTE — Progress Notes (Signed)
PROGRESS NOTE    Kevin Gould  [id number] DOB: 04/16/1946 DOA: 06/16/2019 PCP: [name], [name], MD    Brief Narrative:  73 yo male who presented with the chief complain of weakness and falling.  He does have significant past medical history for Parkinson's disease, hypertension, dyslipidemia and dementia.  Patient was found on the bathroom floor, by his neighbor apparently patient living a very poor healthy conditions.  It was unclear for how long patient lay on the floor.  Patient has been noncompliant with his medications.  On his initial physical examination his blood pressure was 141/99, heart rate 86, temperature 98, respiratory 21, oxygen saturation 95%, he was awake and alert x1, confused and disoriented, his lungs are clear to auscultation bilaterally, heart S1-S2 present rhythm, abdomen soft nontender, no lower extremity edema. Sodium 144, potassium 3.3, chloride 105, bicarb 26, glucose 106, BUN 35, creatinine 1.0, AST 182, ALT 52, total bilirubin is 2.2, CK 14,658.  White count 14.3, hemoglobin 14.2, hematocrit 42.4, platelets 190.  SARS COVID-19 was negative.  Urinalysis 0-5 white cells, 11-20 RBCs, large hemoglobin.  Toxicology screen negative, alcohol less than 10.  CT angiography of the head and neck negative for significant changes, chest x-ray, left rotation with left base infiltrate.  EKG 59 bpm, left axis deviation, normal intervals, sinus rhythm, no ST segment or T wave changes.  Patient was admitted to the hospital working diagnosis of metabolic encephalopathy complicated by rhabdomyolysis.  Assessment & Plan:   Active Problems:   HYPERCHOLESTEROLEMIA   GERD   Essential hypertension   Rhabdomyolysis   Hypotension   Frequent falls   Parkinson's disease (HCC)   Pressure injury of skin   1. Rhabdomyolysis with hypokalemia, hypernatremia and hypercholremia. Patient is tolerating po well, Ck is trending down to 2,776 from 4,080, renal function is preserved with a serum  cr at 0,77, K at 3,2 and serum bicarbonate of 24. Will continue K correction with Kcl 40 meq x2 doses today. Continue hydration with hypotonic saline (100 ml/ H) and will follow with renal panel plus Ck in am.   2. Acute metabolic encephalopathy. Patient slow to respond to questions, but not agitated. Positive mild confusion. Continue supportive medical care, will need physical therapy evaluation.  3. Parkinson's disease. No tremors but noted slow to response, will continue aspiration precautions, dysphagia 2 diet. Neuro checks per unit protocol and physical therapy. Will need snf at discharge. Continue with sinement therapy.   4. Severe calorie protein malnutrition. Continue nutritional supplements.   5. HTN. Continue blood pressure monitoring.   6 TIA ruled out.   7. Left hip stage 1 pressure ulcer, present on admission/ with local cellulitis/ present on admission. Continue with local wound care. Will complete 3 days of therapy with doxycycline.   DVT prophylaxis: enoxaparin   Code Status: full Family Communication: no family at the bedside  Disposition Plan/ discharge barriers: pending SNF placement.   Body mass index is 19.34 kg/m. Malnutrition Type:      Malnutrition Characteristics:      Nutrition Interventions:     RN Pressure Injury Documentation: Pressure Injury 06/16/19 Left Stage I -  Intact skin with non-blanchable redness of a localized area usually over a bony prominence. red pressure area was reported by noc nurse (Active)  06/16/19 2355  Location:   Location Orientation: Left  Staging: Stage I -  Intact skin with non-blanchable redness of a localized area usually over a bony prominence.  Wound Description (Comments): red pressure area was  reported by noc nurse  Present on Admission: Yes     Consultants:     Procedures:     Antimicrobials:       Subjective: Patient continue to be very weak and deconditioned, no nausea or vomiting, no chest  pain or dyspnea.   Objective: Vitals:   06/20/19 0359 06/20/19 0423 06/20/19 0700 06/20/19 1100  BP: (!) 180/155 (!) 125/95 (!) 155/96 127/89  Pulse: (!) 136 70 100 67  Resp: 15 16 16 17   Temp: (!) 97.5 F (36.4 C) 97.6 F (36.4 C) 98.2 F (36.8 C) 98.5 F (36.9 C)  TempSrc: Oral Oral Axillary Axillary  SpO2: 99% 99% 97% 97%  Weight:        Intake/Output Summary (Last 24 hours) at 06/20/2019 1300 Last data filed at 06/20/2019 0830 Gross per 24 hour  Intake 100 ml  Output 700 ml  Net -600 ml   Filed Weights   06/16/19 2029  Weight: 57.7 kg    Examination:   General: deconditioned and ill looking appearing.  Neurology: Awake and alert x2, non focal. Positive confusion, and slow to respond to simple questions.   E ENT: no pallor, no icterus, oral mucosa moist Cardiovascular: No JVD. S1-S2 present, rhythmic, no gallops, rubs, or murmurs. No lower extremity edema. Pulmonary: positive reath sounds bilaterally, adequate air movement, no wheezing, rhonchi or rales. Gastrointestinal. Abdomen with no organomegaly, non tender, no rebound or guarding Skin. No rashes Musculoskeletal: no joint deformities     Data Reviewed: I have personally reviewed following labs and imaging studies  CBC: Recent Labs  Lab 06/16/19 1431 06/16/19 1617 06/16/19 2200 06/18/19 0351 06/19/19 0554 06/20/19 0442  WBC 14.3*  --  12.5* 9.7 10.4 10.3  NEUTROABS 13.0*  --   --   --   --   --   HGB 14.2 15.0 15.8 13.4 13.8 13.4  HCT 42.4 44.0 45.3 39.4 42.2 40.0  MCV 97.2  --  94.4 97.0 98.4 96.9  PLT 190  --  195 160 174 172   Basic Metabolic Panel: Recent Labs  Lab 06/16/19 1431 06/16/19 1617 06/16/19 2200 06/18/19 0351 06/19/19 0554 06/20/19 0442  NA 144 144  --  149* 150* 147*  K 3.3* 3.7  --  3.4* 3.6 3.2*  CL 105 103  --  116* 117* 115*  CO2 26  --   --  24 23 24   GLUCOSE 106* 100*  --  97 96 130*  BUN 35* 37*  --  33* 23 19  CREATININE 1.08 1.00 0.99 0.88 0.77 0.77  CALCIUM  8.7*  --   --  8.4* 8.6* 8.6*   GFR: Estimated Creatinine Clearance: 69.1 mL/min (by C-G formula based on SCr of 0.77 mg/dL). Liver Function Tests: Recent Labs  Lab 06/16/19 1431  AST 192*  ALT 52*  ALKPHOS 40  BILITOT 2.2*  PROT 6.2*  ALBUMIN 3.8   No results for input(s): LIPASE, AMYLASE in the last 168 hours. Recent Labs  Lab 06/16/19 1602  AMMONIA 19   Coagulation Profile: Recent Labs  Lab 06/16/19 1431  INR 1.3*   Cardiac Enzymes: Recent Labs  Lab 06/16/19 1431 06/17/19 0207 06/18/19 0351 06/19/19 0554 06/20/19 0442  CKTOTAL 14,658* 14,870* 6,480* 4,080* 2,776*   BNP (last 3 results) No results for input(s): PROBNP in the last 8760 hours. HbA1C: No results for input(s): HGBA1C in the last 72 hours. CBG: Recent Labs  Lab 06/16/19 2020  GLUCAP 93   Lipid Profile: No  results for input(s): CHOL, HDL, LDLCALC, TRIG, CHOLHDL, LDLDIRECT in the last 72 hours. Thyroid Function Tests: No results for input(s): TSH, T4TOTAL, FREET4, T3FREE, THYROIDAB in the last 72 hours. Anemia Panel: No results for input(s): VITAMINB12, FOLATE, FERRITIN, TIBC, IRON, RETICCTPCT in the last 72 hours.    Radiology Studies: I have reviewed all of the imaging during this hospital visit personally     Scheduled Meds: . aspirin  81 mg Per Tube Daily   Or  . aspirin  300 mg Rectal Daily  . carbidopa-levodopa  1 tablet Oral TID  . carbidopa-levodopa  1 tablet Oral Daily  . enoxaparin (LOVENOX) injection  40 mg Subcutaneous Q24H  . famotidine  20 mg Oral BID   Continuous Infusions: . dextrose 75 mL/hr at 06/19/19 1229  . doxycycline (VIBRAMYCIN) IV 100 mg (06/20/19 1242)     LOS: 4 days        Bravlio Luca Gerome Apley, MD

## 2019-06-20 NOTE — TOC Progression Note (Signed)
Transition of Care [facility]) - Progression Note    Patient Details  Name: Kevin Gould MRN: [id number] Date of Birth: 06-03-1946  Transition of Care [facility]) CM/SW [place], [place] Phone Number: 06/20/2019, 2:45 PM  Clinical Narrative:  CSW spoke with patient's POA, [name], who lives in [place]. CSW asked [name] to send POA paperwork, as there are no copies in the patient's chart. [name] can fax paperwork tomorrow. CSW spoke with [facility] about discharge plans, and explained need to pursue SNF prior to transition to ALF. [name] in agreement for SNF as that will be covered under insurance, as the patient did not designate a financial power of attorney and [name] is not willing to do that for him. CSW provided [name] with SNF choices, and she will research and call CSW back with choice.     Expected Discharge Plan: Skilled Nursing Facility Barriers to Discharge: Insurance Authorization  Expected Discharge Plan and Services Expected Discharge Plan: [place] In-house Referral: Clinical Social Work Discharge Planning Services: NA Post Acute Care Choice: [place] Living arrangements for the past 2 months: Single Family Home                 DME Arranged: N/A DME Agency: NA       HH Arranged: NA HH Agency: NA         Social Determinants of Health (SDOH) Interventions    Readmission Risk Interventions No flowsheet data found.

## 2019-06-20 NOTE — Progress Notes (Signed)
06/20/19 1200  PT Visit Information  Last PT Received On 06/20/19  Assistance Needed +2  History of Present Illness 73 y.o. male, with  history of Parkinson disease, hypertension, hyperlipidemia, headache, multiple falls, dementia who was found down by neighbor (unknown for how long) and fount to have confusion, L side weakness. Admitted for further workup. MRI brain with no evidence of acute infarct. Pt with rhabdomyolysis  Subjective Data  Patient Stated Goal sit up  Precautions  Precautions Fall  Precaution Comments Pt has L hip stage I pressure injury.  Restrictions  Weight Bearing Restrictions No  Pain Assessment  Pain Assessment No/denies pain  Cognition  Arousal/Alertness Awake/alert  Behavior During Therapy WFL for tasks assessed/performed;Flat affect  Overall Cognitive Status History of cognitive impairments - at baseline  General Comments Pt hx of dementia. Pt unable to state name. Pt aware in [place], but unable to state in hospital. Aware to situation somewhat as pt states "I haven't been doing well."  Bed Mobility  Overal bed mobility Needs Assistance  Bed Mobility Sit to Supine;Supine to Sit  Supine to sit Total assist;+2 for physical assistance;+2 for safety/equipment  Sit to supine Total assist;+2 for physical assistance;+2 for safety/equipment  General bed mobility comments Pt required total A + 2 for supine to sit and sit to supine. Assist includes physical A to move legs to EOB and trunk elevation.  Transfers  Overall transfer level Needs assistance  Equipment used 2 person hand held assist  Transfers Sit to/from Stand  Sit to Stand Max assist;Total assist;+2 physical assistance;+2 safety/equipment  General transfer comment Pt performs sit to stand transfer x 2 with max/total A +2 with hand held assist. Used pad to help facilitate upright posture. Pt required manual blocking of knees for transfer Pt required VCs and tactile cues for hand placement on therapist arms for  transfer.  Ambulation/Gait  General Gait Details unable  Balance  Overall balance assessment Needs assistance  Sitting-balance support Feet supported;No upper extremity supported  Sitting balance-Leahy Scale Poor  Sitting balance - Comments Pt has difficulty maintaining seated balance and required VCs for anterior weight shift. Pt iniatilly requiring physical assist to maintain balance, however, noted improved trunk control as session progressed.  Standing balance support Bilateral upper extremity supported  Standing balance-Leahy Scale Poor  Standing balance comment Pt reliant on external support including UE support.  General Comments  General comments (skin integrity, edema, etc.) L hip stage I pressure injury  PT - End of Session  Equipment Utilized During Treatment Gait belt  Activity Tolerance Patient tolerated treatment well  Patient left in bed;with call bell/phone within reach;with SCD's reapplied;with bed alarm set  Nurse Communication Mobility status   PT - Assessment/Plan  PT Plan Current plan remains appropriate  PT Visit Diagnosis Muscle weakness (generalized) (M62.81);Unsteadiness on feet (R26.81);History of falling (Z91.81);Difficulty in walking, not elsewhere classified (R26.2)  PT Frequency (ACUTE ONLY) Min 2X/week  Follow Up Recommendations SNF;Supervision/Assistance - 24 hour  PT equipment None recommended by PT  AM-PAC PT "6 Clicks" Mobility Outcome Measure (Version 2)  Help needed turning from your back to your side while in a flat bed without using bedrails? 1  Help needed moving from lying on your back to sitting on the side of a flat bed without using bedrails? 1  Help needed moving to and from a bed to a chair (including a wheelchair)? 1  Help needed standing up from a chair using your arms (e.g., wheelchair or bedside chair)? 1  Help needed to walk in hospital room? 1  Help needed climbing 3-5 steps with a railing?  1  6 Click Score 6  Consider  Recommendation of Discharge To: CIR/SNF/LTACH  PT Goal Progression  Progress towards PT goals Progressing toward goals  Acute Rehab PT Goals  PT Goal Formulation With patient  Time For Goal Achievement 07/01/19  Potential to Achieve Goals Fair  PT Time Calculation  PT Start Time (ACUTE ONLY) 1205  PT Stop Time (ACUTE ONLY) 1225  PT Time Calculation (min) (ACUTE ONLY) 20 min  PT Treatments  $Therapeutic Activity 8-22 mins   Pt progressing towards goals. Pt more alert this therapy session and able to more coherently communicate. Speech still mumbled, but more understandable. Pt completed mobility tasks at the max A to total A +2 level. Pt will continue to benefit from skilled acute physical therapy to promote functional independence and safety.     Ellin GoodieMegan Vaani Morren, SPT

## 2019-06-21 ENCOUNTER — Other Ambulatory Visit: Payer: Self-pay | Admitting: *Deleted

## 2019-06-21 LAB — BASIC METABOLIC PANEL
Anion gap: 7 (ref 5–15)
BUN: 19 mg/dL (ref 8–23)
CO2: 24 mmol/L (ref 22–32)
Calcium: 8.5 mg/dL — ABNORMAL LOW (ref 8.9–10.3)
Chloride: 118 mmol/L — ABNORMAL HIGH (ref 98–111)
Creatinine, Ser: 0.82 mg/dL (ref 0.61–1.24)
GFR calc Af Amer: >60 mL/min (ref >60)
GFR calc non Af Amer: >60 mL/min (ref >60)
Glucose, Bld: 119 mg/dL — ABNORMAL HIGH (ref 70–99)
Potassium: 3.9 mmol/L (ref 3.5–5.1)
Sodium: 149 mmol/L — ABNORMAL HIGH (ref 135–145)

## 2019-06-21 LAB — CK: Total CK: 1937 U/L — ABNORMAL HIGH (ref 49–397)

## 2019-06-21 NOTE — Progress Notes (Signed)
PROGRESS NOTE    Kevin Gould  ZOX:[id number] DOB: 06/25/1946 DOA: 06/16/2019 PCP: [name], [name], MD    Brief Narrative:  73 yo male who presented with the chief complain of weakness and falling.  He does have significant past medical history for Parkinson's disease, hypertension, dyslipidemia and dementia.  Patient was found on the bathroom floor, by his neighbor apparently patient living a very poor healthy conditions.  It was unclear for how long patient lay on the floor.  Patient has been noncompliant with his medications.  On his initial physical examination his blood pressure was 141/99, heart rate 86, temperature 98, respiratory 21, oxygen saturation 95%, he was awake and alert x1, confused and disoriented, his lungs are clear to auscultation bilaterally, heart S1-S2 present rhythm, abdomen soft nontender, no lower extremity edema. Sodium 144, potassium 3.3, chloride 105, bicarb 26, glucose 106, BUN 35, creatinine 1.0, AST 182, ALT 52, total bilirubin is 2.2, CK 14,658.  White count 14.3, hemoglobin 14.2, hematocrit 42.4, platelets 190.  SARS COVID-19 was negative.  Urinalysis 0-5 white cells, 11-20 RBCs, large hemoglobin.  Toxicology screen negative, alcohol less than 10.  CT angiography of the head and neck negative for significant changes, chest x-ray, left rotation with left base infiltrate.  EKG 59 bpm, left axis deviation, normal intervals, sinus rhythm, no ST segment or T wave changes.  Patient was admitted to the hospital working diagnosis of metabolic encephalopathy complicated by rhabdomyolysis.    Assessment & Plan:   Active Problems:   HYPERCHOLESTEROLEMIA   GERD   Essential hypertension   Rhabdomyolysis   Hypotension   Frequent falls   Parkinson's disease (HCC)   Pressure injury of skin    1. Rhabdomyolysis with hypokalemia, hypernatremia and hypercholremia. No signs of hypervolemia, continue trending down Ck today at  1,937. Renal function continue to be  preserved at 0,82 with K now up to 3,9 and serum bicarbonate stable at 24. Tolerating po well. Persistent hypernatremia 149 and hyperchloremia 118, will continue D5 W at 75 ml per H. Follow on renal panel in am. Discontinue telemetry.   2. Acute metabolic encephalopathy. Positive confusion and continue to have slow response, no agitation. Will continue supportive medical therapy, trial to remove mittens. Pending placement at SNF.  3. Parkinson's disease. Continue with dysphagia 2 diet, needs assistance for feeding. Physical therapy evaluation, and nutritional support.Continue with sinemet.   4. Severe calorie protein malnutrition. On nutritional supplements.   5. HTN. Stable blood pressure, off antihypertensive medications.   6 TIA ruled out.   7. Left hip stage 1 pressure ulcer, present on admission/ with local cellulitis/ present on admission. Local wound care. Plan to complete today 3 days of doxycycline.   DVT prophylaxis: enoxaparin   Code Status: full Family Communication: no family at the bedside  Disposition Plan/ discharge barriers: pending SNF placement.      Body mass index is 19.34 kg/m. Malnutrition Type:      Malnutrition Characteristics:      Nutrition Interventions:     RN Pressure Injury Documentation: Pressure Injury 06/16/19 Left Stage I -  Intact skin with non-blanchable redness of a localized area usually over a bony prominence. red pressure area was reported by noc nurse (Active)  06/16/19 2355  Location:   Location Orientation: Left  Staging: Stage I -  Intact skin with non-blanchable redness of a localized area usually over a bony prominence.  Wound Description (Comments): red pressure area was reported by noc nurse  Present on Admission:  Yes     Consultants:   Neurology   Procedures:     Antimicrobials:       Subjective: Patient continue to be very weak and deconditioned, no nausea or vomiting and tolerating po well.  Needs assistance for feedings. Has bilateral mittens in place. No agitation.   Objective: Vitals:   06/21/19 0014 06/21/19 0327 06/21/19 0700 06/21/19 1100  BP: (!) 150/100 (!) 136/110 (!) 144/99 (!) 132/94  Pulse: 82 (!) 115 100 87  Resp:  17 17 17   Temp:  99.1 F (37.3 C) 98 F (36.7 C) 99.2 F (37.3 C)  TempSrc:  Oral Oral Axillary  SpO2:   98% 97%  Weight:        Intake/Output Summary (Last 24 hours) at 06/21/2019 1302 Last data filed at 06/21/2019 0700 Gross per 24 hour  Intake 120 ml  Output -  Net 120 ml   Filed Weights   06/16/19 2029  Weight: 57.7 kg    Examination:   General: Not in pain or dyspnea, deconditioned and ill looking appearing.  Neurology: Awake and alert, non focal  E ENT: positive pallor, no icterus, oral mucosa moist Cardiovascular: No JVD. S1-S2 present, rhythmic, no gallops, rubs, or murmurs. No lower extremity edema. Pulmonary: positive breath sounds bilaterally,decreased air movement, no wheezing, rhonchi or rales. Gastrointestinal. Abdomen with no organomegaly, non tender, no rebound or guarding Skin. No rashes Musculoskeletal: no joint deformities     Data Reviewed: I have personally reviewed following labs and imaging studies  CBC: Recent Labs  Lab 06/16/19 1431 06/16/19 1617 06/16/19 2200 06/18/19 0351 06/19/19 0554 06/20/19 0442  WBC 14.3*  --  12.5* 9.7 10.4 10.3  NEUTROABS 13.0*  --   --   --   --   --   HGB 14.2 15.0 15.8 13.4 13.8 13.4  HCT 42.4 44.0 45.3 39.4 42.2 40.0  MCV 97.2  --  94.4 97.0 98.4 96.9  PLT 190  --  195 160 174 161   Basic Metabolic Panel: Recent Labs  Lab 06/16/19 1431 06/16/19 1617 06/16/19 2200 06/18/19 0351 06/19/19 0554 06/20/19 0442 06/21/19 0432  NA 144 144  --  149* 150* 147* 149*  K 3.3* 3.7  --  3.4* 3.6 3.2* 3.9  CL 105 103  --  116* 117* 115* 118*  CO2 26  --   --  24 23 24 24   GLUCOSE 106* 100*  --  97 96 130* 119*  BUN 35* 37*  --  33* 23 19 19   CREATININE 1.08 1.00 0.99  0.88 0.77 0.77 0.82  CALCIUM 8.7*  --   --  8.4* 8.6* 8.6* 8.5*   GFR: Estimated Creatinine Clearance: 67.4 mL/min (by C-G formula based on SCr of 0.82 mg/dL). Liver Function Tests: Recent Labs  Lab 06/16/19 1431  AST 192*  ALT 52*  ALKPHOS 40  BILITOT 2.2*  PROT 6.2*  ALBUMIN 3.8   No results for input(s): LIPASE, AMYLASE in the last 168 hours. Recent Labs  Lab 06/16/19 1602  AMMONIA 19   Coagulation Profile: Recent Labs  Lab 06/16/19 1431  INR 1.3*   Cardiac Enzymes: Recent Labs  Lab 06/17/19 0207 06/18/19 0351 06/19/19 0554 06/20/19 0442 06/21/19 0432  CKTOTAL 14,870* 6,480* 4,080* 2,776* 1,937*   BNP (last 3 results) No results for input(s): PROBNP in the last 8760 hours. HbA1C: No results for input(s): HGBA1C in the last 72 hours. CBG: Recent Labs  Lab 06/16/19 2020  GLUCAP 93   Lipid  Profile: No results for input(s): CHOL, HDL, LDLCALC, TRIG, CHOLHDL, LDLDIRECT in the last 72 hours. Thyroid Function Tests: No results for input(s): TSH, T4TOTAL, FREET4, T3FREE, THYROIDAB in the last 72 hours. Anemia Panel: No results for input(s): VITAMINB12, FOLATE, FERRITIN, TIBC, IRON, RETICCTPCT in the last 72 hours.    Radiology Studies: I have reviewed all of the imaging during this hospital visit personally     Scheduled Meds: . carbidopa-levodopa  1 tablet Oral TID  . carbidopa-levodopa  1 tablet Oral Daily  . enoxaparin (LOVENOX) injection  40 mg Subcutaneous Q24H  . famotidine  20 mg Oral BID   Continuous Infusions: . dextrose 75 mL (06/21/19 1102)     LOS: 5 days        Mauricio Annett Gulaaniel Arrien, MD

## 2019-06-21 NOTE — Progress Notes (Signed)
  Speech Language Pathology Treatment: Dysphagia  Patient Details Name: Kevin Gould MRN: [id number] DOB: 12-Sep-1946 Today's Date: 06/21/2019 Time: 6160-7371 SLP Time Calculation (min) (ACUTE ONLY): 14 min  Assessment / Plan / Recommendation Clinical Impression  Pt being fed by nurse tech upon SLP entrance to room.  NT mentioned pt's neck extension and SLP educated NT to use reverse Trendelenburg for optimal positioning given pt's decreased cervical spine ROM and increased risk of aspiration of particulates, etc due to suboptimal positioning.    Per cervical spine imaging pt has partial fusion C4-C5, advanced degenerative changes C5-C6, C6-C7- and note he was likely not taking his Parkinson's medications prior to admit per MD note.   Pt's cough and voice are VERY weak - and he does not inform SLP if this is his baseline.   Advised pt that he will require very strict aspiration precautions given his requirement on others to feed him, dysphagia, pharyngeal retention after swallowing and aspiration. SLP set up oral suction to allow staff to use if indicated given pt's level of dysphagia and weakness. Advised RN and NT to usage if needed.    During SLP observation, pt without indication of airway compromise however NT reports pt was coughing prior to her discontinuing meal.  Appreciate NT using caution and ceasing pt intake as indicated!    Recommend consider initiating RMST to strengthen pt cough/voice and thus his swallow/airway protection if he can tolerate.    At some point, given pt's progressive neuro disease - Parkinson's, frequent falls, level of dysphagia, etc, would advise pt participate in palliative referral to establish his [place].   Pt currently afebrile with lungs clears thus appears to be tolerating po diet at this time.    HPI HPI: Kevin Gould is a 73 y.o. male with has a past medical history of Parkinson's disease which was diagnosed in 2012, hypertension, hyperlipidemia,  headache, multiple falls, dementia which was diagnosed in 2015. He was admitted after being found by a neighbor with left hemiparesis and confusion.  He lives alone under poor conditions; per notes, friends report frequent falls and that pt has not been taking his meds.       SLP Plan  Continue with current plan of care       Recommendations  Diet recommendations: Dysphagia 2 (fine chop);Nectar-thick liquid Liquids provided via: Cup Medication Administration: Crushed with puree Supervision: Trained caregiver to feed patient Compensations: Slow rate;Small sips/bites(assure swallows before giving more, oral suction if needed) Postural Changes and/or Swallow Maneuvers: Seated upright 90 degrees;Upright 30-60 min after meal                Follow up Recommendations: Skilled Nursing facility SLP Visit Diagnosis: Dysphagia, oropharyngeal phase (R13.12) Plan: Continue with current plan of care       [place], [place] SLP [place] Pager [phone] Office [phone]  Macario Golds 06/21/2019, 10:16 AM

## 2019-06-21 NOTE — Patient Outreach (Signed)
[facility]) Care Management  06/21/2019  Kevin Gould 1946/06/11 [id number]   Care coordination  [facility] RN CM reviewed EPIC hospital notes for update on Pt Pt continues to progress with therapies Hospital SW has spoken with family members and is assisting with snf placement   Plan [facility] RN CM will continue to collaborate with [facility] hospital liaison and will follow up within 4-7 days to see if [name] is d/c and re engage if needed  Duncan L. Lavina Hamman, RN, BSN, [place] Coordinator Office number [phone] Mobile number [phone]  Main [facility] number [phone] Fax number [phone]

## 2019-06-22 ENCOUNTER — Other Ambulatory Visit: Payer: Self-pay | Admitting: *Deleted

## 2019-06-22 LAB — BASIC METABOLIC PANEL
Anion gap: 10 (ref 5–15)
BUN: 19 mg/dL (ref 8–23)
CO2: 25 mmol/L (ref 22–32)
Calcium: 8.7 mg/dL — ABNORMAL LOW (ref 8.9–10.3)
Chloride: 112 mmol/L — ABNORMAL HIGH (ref 98–111)
Creatinine, Ser: 0.71 mg/dL (ref 0.61–1.24)
GFR calc Af Amer: >60 mL/min (ref >60)
GFR calc non Af Amer: >60 mL/min (ref >60)
Glucose, Bld: 107 mg/dL — ABNORMAL HIGH (ref 70–99)
Potassium: 3.7 mmol/L (ref 3.5–5.1)
Sodium: 147 mmol/L — ABNORMAL HIGH (ref 135–145)

## 2019-06-22 LAB — CK: Total CK: 1069 U/L — ABNORMAL HIGH (ref 49–397)

## 2019-06-22 MED ORDER — CYANOCOBALAMIN 1000 MCG/ML IJ SOLN
1000.0000 ug | Freq: Once | INTRAMUSCULAR | Status: AC
  Administered 2019-06-22: 14:00:00 1000 ug via INTRAMUSCULAR
  Filled 2019-06-22: qty 1

## 2019-06-22 MED ORDER — CARBIDOPA-LEVODOPA 25-100 MG PO TABS: 1.0000 | ORAL_TABLET | Freq: Every day | ORAL | 0 refills | Status: DC

## 2019-06-22 MED ORDER — FAMOTIDINE 40 MG/5ML PO SUSR: 20.0000 mg | Freq: Two times a day (BID) | ORAL | 0 refills | Status: DC

## 2019-06-22 MED ORDER — CYANOCOBALAMIN 1000 MCG/ML IJ SOLN: INTRAMUSCULAR | 0 refills | Status: DC

## 2019-06-22 MED ORDER — CARBIDOPA-LEVODOPA 25-100 MG PO TABS: 1.0000 | ORAL_TABLET | Freq: Three times a day (TID) | ORAL | 0 refills | Status: DC

## 2019-06-22 NOTE — Consult Note (Signed)
   [facility] Inpatient Consult   06/22/2019  Kevin Gould July 15, 1946 [id number]    [facility] RN care management coordinator alerted hospital liaison of patient's admission to the hospital.  Patient had been recently outreached by [name] RN CM as was referred by patient's friend prior to hospitalization for care coordination. Patient was reviewed and noted with 8% (low) risk score for unplanned readmission and hospitalization under his [employer].  Primary care provider is Dr. Antony Contras with [facility], listed as providing transition of care follow-up.   Patient was admitted to the hospital with working diagnosis of metabolic encephalopathy complicated by rhabdomyolysis.  Review of transition of care SW notes reveal that patient's cousin Kevin Gould) feels that it is no longer safe for him to return home and patient was discharged to skilled nursing facility SN[facility]- [facility] today.  Plan: Will notify [facility] RN care management coordinator of patient's disposition.   For questions and additional information, please call:  Kevin Gould, BSN, RN-BC [facility] Liaison Cell: [phone]

## 2019-06-22 NOTE — Patient Outreach (Signed)
[name]) Care Management  06/22/2019  Kevin Gould [birth year] [id number]   [facility] case closure  [facility] RN CM notified by [facility] RN CM hospital liaison, [name] and per hospital SW note that the patient's cousin [name]) feels that it is not safe for pt. to return home and so he was discharged to skilled nursing facility SN[facility]- [facility] today [name] is aware  Plans  [facility] RN CM will close case at this time as patient is discharged to [place] center 06/22/19 with an external care management program  [facility] facility coordinator with update [facility] RN CM prn   Routed note to Primary MD, [name], RN, BSN, [name] Coordinator Office number [phone] Mobile number [phone]  Main [facility] number [phone] Fax number [phone]'

## 2019-06-22 NOTE — TOC Transition Note (Signed)
Transition of Care [facility]) - CM/SW Discharge Note   Patient Details  Name: Kevin Gould MRN: [id number] Date of Birth: 1945-12-22  [facility]) CM/SW Contact:  Geralynn Ochs, LCSW Phone Number: 06/22/2019, 1:19 PM   Clinical Narrative:   Nurse to call report to [phone], Room 3221.  Transport set for 2:30 PM.    Final next level of care: [place] Barriers to Discharge: Barriers Resolved   Patient Goals and CMS Choice Patient states their goals for this hospitalization and ongoing recovery are:: Pt cousin feels that it is not safe for him to return home. CMS Medicare.gov Compare Post Acute Care list provided to:: Patient Represenative (must comment)(Pt Cousin)    Discharge Placement              Patient [facility]: [facility] Patient to be transferred to facility by: [place] Name of family member notified: [name] Patient and family notified of of transfer: 06/22/19  Discharge Plan and Services In-house Referral: Clinical Social Work Discharge Planning Services: NA Post Acute Care Choice: [place]          DME Arranged: N/A DME Agency: NA       HH Arranged: NA HH Agency: NA        Social Determinants of Health (SDOH) Interventions     Readmission Risk Interventions No flowsheet data found.

## 2019-06-22 NOTE — Progress Notes (Signed)
Pt picked up by [facility] to be transported off to disposition. Pt transported off unit via stretcher with belongings and condom cath on and intact. Delia Heady RN

## 2019-06-22 NOTE — Discharge Summary (Addendum)
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 valleculae;Pharyngeal residue - pyriform;Delayed swallow initiation-pyriform sinuses;Penetration/Aspiration before swallow;Penetration/Aspiration during swallow;Moderate aspiration Pharyngeal Material enters airway, passes BELOW cords and not ejected out despite cough attempt by patient Pharyngeal- Thin Straw -- Pharyngeal -- Pharyngeal- Puree Delayed swallow initiation-vallecula;Reduced anterior laryngeal mobility;Reduced tongue base retraction;Pharyngeal residue - valleculae Pharyngeal -- Pharyngeal- Mechanical Soft Delayed swallow initiation-vallecula;Reduced anterior laryngeal mobility;Reduced tongue base retraction;Pharyngeal residue - valleculae Pharyngeal -- Pharyngeal- Regular -- Pharyngeal -- Pharyngeal- Multi-consistency -- Pharyngeal -- Pharyngeal- Pill -- Pharyngeal -- Pharyngeal Comment --  CHL IP CERVICAL ESOPHAGEAL PHASE 06/19/2019 Cervical Esophageal Phase (No Data) Pudding Teaspoon -- Pudding Cup -- Honey Teaspoon -- Honey Cup -- Nectar Teaspoon -- Nectar Cup -- Nectar Straw -- Thin Teaspoon -- Thin Cup -- Thin Straw -- Puree -- Mechanical Soft -- Regular -- Multi-consistency -- Pill -- Cervical Esophageal Comment -- Ferdinand Lango MA, CCC-SLP McCoy Leah Meryl 06/19/2019, 11:56 AM              Ct Head Code  Stroke Wo Contrast  Result Date: 06/16/2019 CLINICAL DATA:  Code stroke. Additional history provided: Patient arrives via EMS found by neighbor sitting on toilet leaned over toward the left side with neck holding up patient, left arm dangling. Left arm was numb on EMS arrival and now feels like pins and needles. EXAM: CT HEAD WITHOUT CONTRAST TECHNIQUE: Contiguous axial images were obtained from the base of the skull through the vertex without intravenous contrast. COMPARISON:  Brain MRI 12/01/2013, head CT 11/30/2013 FINDINGS: Brain: Streak artifact limits evaluation. Within this limitation, no evidence of acute demarcated territorial infarction. No evidence of acute intracranial hemorrhage. Redemonstrated lateral and third ventriculomegaly with asymmetric prominence of the right temporal horn. 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 No midline shift or extra-axial collection. Ill-defined hypoattenuation of the cerebral white matter is nonspecific, but consistent with chronic small vessel ischemic disease Vascular: No definite hyperdense vessel. Atherosclerotic calcification of the carotid artery siphons and vertebrobasilar system Skull: No calvarial fracture. Imaged globes and orbits demonstrate no acute abnormality. Sinuses/Orbits: There is a large cyst surrounding right-sided maxillary teeth encroaching upon the right maxillary sinus. The visualized paranasal sinuses are otherwise well aerated. No significant mastoid effusion ASPECTS (Alberta Stroke Program Early CT Score) - Ganglionic level infarction (caudate, lentiform nuclei, internal capsule, insula, M1-M3 cortex): 7 - Supraganglionic infarction (M4-M6 cortex): 3 Total score (0-10 with 10 being normal): 10 These results were communicated to Dr. Laurence Slate At 2:59 pmon 8/20/2020by text page via the [facility] messaging system. IMPRESSION: Examination limited by streak artifact. No evidence of acute  demarcated territorial infarction or acute intracranial hemorrhage. ASPECTS 10. Lateral and third ventriculomegaly is similar to prior brain MRI 12/01/2013. 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 Cyst surrounding multiple right-sided maxillary teeth encroaching upon the right maxillary sinus. Electronically Signed   By: Jackey Loge   On: 06/16/2019 15:03      Procedures:   Subjective: Patient is feeling stable, continue to be very weak and deconditioned, no nausea or vomiting, tolerating po well with assistance. Continue to have slow time response.   Discharge Exam: Vitals:   06/22/19 0358 06/22/19 0900  BP: (!) 136/92 132/83  Pulse: 93 84  Resp: 15 17  Temp: 98.1 F (36.7 C) 98.3 F (36.8 C)  SpO2: 96% 98%  Vitals:   06/21/19 1914 06/21/19 2332 06/22/19 0358 06/22/19 0900  BP: 115/79 112/76 (!) 136/92 132/83  Pulse: (!) 106 91 93 84  Resp: 17 15 15 17   Temp: 98 F (36.7 C) 98.6 F (37 C) 98.1 F (36.7 C) 98.3 F (36.8 C)  TempSrc: Oral Oral Oral Axillary  SpO2: 96% 96% 96% 98%  Weight:        General: Not in pain or dyspnea, deconditioned  Neurology: Awake and alert, non focal. Generalized weakness.  E ENT:  mild pallor, no icterus, oral mucosa moist Cardiovascular: No JVD. S1-S2 present, rhythmic, no gallops, rubs, or murmurs. No lower extremity edema. Pulmonary: positive breath sounds bilaterally, adequate air movement, no wheezing, rhonchi or rales. Gastrointestinal. Abdomen with no organomegaly, non tender, no rebound or guarding Skin. No rashes Musculoskeletal: no joint deformities   The results of significant diagnostics from this hospitalization (including imaging, microbiology, ancillary and laboratory) are listed below for reference.     Microbiology: Recent Results (from the past 240 hour(s))  SARS CORONAVIRUS 2 Nasal Swab Aptima Multi Swab     Status: None   Collection Time: 06/16/19  4:55 PM    Specimen: Aptima Multi Swab; Nasal Swab  Result Value Ref Range Status   SARS Coronavirus 2 NEGATIVE NEGATIVE Final    Comment: (NOTE) SARS-CoV-2 target nucleic acids are NOT DETECTED. The SARS-CoV-2 RNA is generally detectable in upper and lower respiratory specimens during the acute phase of infection. Negative results do not preclude SARS-CoV-2 infection, do not rule out co-infections with other pathogens, and should not be used as the sole basis for treatment or other patient management decisions. Negative results must be combined with clinical observations, patient history, and epidemiological information. The expected result is Negative. Fact Sheet for Patients: HairSlick.no Fact Sheet for Healthcare Providers: quierodirigir.com This test is not yet approved or cleared by the [place] FDA and  has been authorized for detection and/or diagnosis of SARS-CoV-2 by FDA under an Emergency Use Authorization (EUA). This EUA will remain  in effect (meaning this test can be used) for the duration of the COVID-19 declaration under Section 56 4(b)(1) of the Act, 21 U.S.C. section 360bbb-3(b)(1), unless the authorization is terminated or revoked sooner. Performed at [facility], [address], [place], [place] [address]   Culture, Urine     Status: None   Collection Time: 06/17/19  6:01 PM   Specimen: Urine, Random  Result Value Ref Range Status   Specimen Description URINE, RANDOM  Final   Special Requests   Final    NONE Performed at [facility], [address], [place], [place] [address]    Culture   Final    Multiple bacterial morphotypes present, none predominant. Suggest appropriate recollection if clinically indicated.   Report Status 06/19/2019 FINAL  Final     Labs: BNP (last 3 results) No results for input(s): BNP in the last 8760 hours. Basic Metabolic Panel: Recent Labs  Lab 06/18/19 0351  06/19/19 0554 06/20/19 0442 06/21/19 0432 06/22/19 0346  NA 149* 150* 147* 149* 147*  K 3.4* 3.6 3.2* 3.9 3.7  CL 116* 117* 115* 118* 112*  CO2 24 23 24 24 25   GLUCOSE 97 96 130* 119* 107*  BUN 33* 23 19 19 19   CREATININE 0.88 0.77 0.77 0.82 0.71  CALCIUM 8.4* 8.6* 8.6* 8.5* 8.7*   Liver Function Tests: Recent Labs  Lab 06/16/19 1431  AST 192*  ALT 52*  ALKPHOS 40  BILITOT 2.2*  PROT 6.2*  ALBUMIN 3.8   No results for input(s): LIPASE, AMYLASE in the last 168 hours. Recent Labs  Lab 06/16/19 1602  AMMONIA 19   CBC: Recent Labs  Lab 06/16/19 1431 06/16/19 1617 06/16/19 2200 06/18/19 0351 06/19/19 0554 06/20/19 0442  WBC 14.3*  --  12.5* 9.7 10.4 10.3  NEUTROABS 13.0*  --   --   --   --   --   HGB 14.2 15.0 15.8 13.4 13.8 13.4  HCT 42.4 44.0 45.3 39.4 42.2 40.0  MCV 97.2  --  94.4 97.0 98.4 96.9  PLT 190  --  195 160 174 172   Cardiac Enzymes: Recent Labs  Lab 06/18/19 0351 06/19/19 0554 06/20/19 0442 06/21/19 0432 06/22/19 0346  CKTOTAL 6,480* 4,080* 2,776* 1,937* 1,069*   BNP: Invalid input(s): POCBNP CBG: Recent Labs  Lab 06/16/19 2020  GLUCAP 93   D-Dimer No results for input(s): DDIMER in the last 72 hours. Hgb A1c No results for input(s): HGBA1C in the last 72 hours. Lipid Profile No results for input(s): CHOL, HDL, LDLCALC, TRIG, CHOLHDL, LDLDIRECT in the last 72 hours. Thyroid function studies No results for input(s): TSH, T4TOTAL, T3FREE, THYROIDAB in the last 72 hours.  Invalid input(s): FREET3 Anemia work up No results for input(s): VITAMINB12, FOLATE, FERRITIN, TIBC, IRON, RETICCTPCT in the last 72 hours. Urinalysis    Component Value Date/Time   COLORURINE AMBER (A) 06/17/2019 1800   APPEARANCEUR HAZY (A) 06/17/2019 1800   LABSPEC 1.039 (H) 06/17/2019 1800   PHURINE 5.0 06/17/2019 1800   GLUCOSEU NEGATIVE 06/17/2019 1800   HGBUR LARGE (A) 06/17/2019 1800   BILIRUBINUR NEGATIVE 06/17/2019 1800   KETONESUR 20 (A)  06/17/2019 1800   PROTEINUR 100 (A) 06/17/2019 1800   UROBILINOGEN 0.2 11/30/2013 1223   NITRITE NEGATIVE 06/17/2019 1800   LEUKOCYTESUR NEGATIVE 06/17/2019 1800   Sepsis Labs Invalid input(s): PROCALCITONIN,  WBC,  LACTICIDVEN Microbiology Recent Results (from the past 240 hour(s))  SARS CORONAVIRUS 2 Nasal Swab Aptima Multi Swab     Status: None   Collection Time: 06/16/19  4:55 PM   Specimen: Aptima Multi Swab; Nasal Swab  Result Value Ref Range Status   SARS Coronavirus 2 NEGATIVE NEGATIVE Final    Comment: (NOTE) SARS-CoV-2 target nucleic acids are NOT DETECTED. The SARS-CoV-2 RNA is generally detectable in upper and lower respiratory specimens during the acute phase of infection. Negative results do not preclude SARS-CoV-2 infection, do not rule out co-infections with other pathogens, and should not be used as the sole basis for treatment or other patient management decisions. Negative results must be combined with clinical observations, patient history, and epidemiological information. The expected result is Negative. Fact Sheet for Patients: HairSlick.no Fact Sheet for Healthcare Providers: quierodirigir.com This test is not yet approved or cleared by the [place] FDA and  has been authorized for detection and/or diagnosis of SARS-CoV-2 by FDA under an Emergency Use Authorization (EUA). This EUA will remain  in effect (meaning this test can be used) for the duration of the COVID-19 declaration under Section 56 4(b)(1) of the Act, 21 U.S.C. section 360bbb-3(b)(1), unless the authorization is terminated or revoked sooner. Performed at [facility], [address] [place], [place] [address]   Culture, Urine     Status: None   Collection Time: 06/17/19  6:01 PM   Specimen: Urine, Random  Result Value Ref Range Status   Specimen Description URINE, RANDOM  Final   Special Requests  Final     NONE Performed at [facility], [address], [place], [place] [address]    Culture   Final    Multiple bacterial morphotypes present, none predominant. Suggest appropriate recollection if clinically indicated.   Report Status 06/19/2019 FINAL  Final     Time coordinating discharge: 45 minutes  SIGNED:   Coralie Keens, MD  [facility] 06/22/2019, 11:18 AM

## 2019-06-22 NOTE — Progress Notes (Signed)
Pt discharge education and instructions completed. Pt discharge to [facility]. Pt IV removed; dsg changed to left neck and left knee abrasion; left him dsg remain clean, dry and intact. Pt alert and verbally responsive; VSS: Report called off to nurse Nia at the facility. Awaiting on [facility] to come transport pt to disposition. Will continue to closely monitor till pt picked up. P. Angelica Pou RN

## 2019-06-28 ENCOUNTER — Ambulatory Visit: Payer: Self-pay | Admitting: *Deleted

## 2019-08-17 ENCOUNTER — Encounter (HOSPITAL_COMMUNITY): Payer: Self-pay | Admitting: Internal Medicine

## 2019-08-17 ENCOUNTER — Emergency Department (HOSPITAL_COMMUNITY): Payer: Medicare Other

## 2019-08-17 ENCOUNTER — Other Ambulatory Visit: Payer: Self-pay

## 2019-08-17 ENCOUNTER — Inpatient Hospital Stay (HOSPITAL_COMMUNITY)
Admission: EM | Admit: 2019-08-17 | Discharge: 2019-08-22 | DRG: 480 | Disposition: A | Discharge status: Skilled Nursing Facility | Payer: Medicare Other | Source: Skilled Nursing Facility | Attending: Internal Medicine | Admitting: Internal Medicine

## 2019-08-17 DIAGNOSIS — S2232XA Fracture of one rib, left side, initial encounter for closed fracture: Secondary | ICD-10-CM | POA: Diagnosis present

## 2019-08-17 DIAGNOSIS — S7292XA Unspecified fracture of left femur, initial encounter for closed fracture: Secondary | ICD-10-CM | POA: Diagnosis not present

## 2019-08-17 DIAGNOSIS — S32010D Wedge compression fracture of first lumbar vertebra, subsequent encounter for fracture with routine healing: Secondary | ICD-10-CM | POA: Diagnosis not present

## 2019-08-17 DIAGNOSIS — S72142D Displaced intertrochanteric fracture of left femur, subsequent encounter for closed fracture with routine healing: Secondary | ICD-10-CM | POA: Diagnosis not present

## 2019-08-17 DIAGNOSIS — G20 Parkinson's disease: Secondary | ICD-10-CM

## 2019-08-17 DIAGNOSIS — Z87891 Personal history of nicotine dependence: Secondary | ICD-10-CM

## 2019-08-17 DIAGNOSIS — D649 Anemia, unspecified: Secondary | ICD-10-CM | POA: Diagnosis present

## 2019-08-17 DIAGNOSIS — Z20828 Contact with and (suspected) exposure to other viral communicable diseases: Secondary | ICD-10-CM | POA: Diagnosis present

## 2019-08-17 DIAGNOSIS — D62 Acute posthemorrhagic anemia: Secondary | ICD-10-CM | POA: Diagnosis not present

## 2019-08-17 DIAGNOSIS — M810 Age-related osteoporosis without current pathological fracture: Secondary | ICD-10-CM | POA: Diagnosis present

## 2019-08-17 DIAGNOSIS — E559 Vitamin D deficiency, unspecified: Secondary | ICD-10-CM | POA: Diagnosis present

## 2019-08-17 DIAGNOSIS — Z79899 Other long term (current) drug therapy: Secondary | ICD-10-CM

## 2019-08-17 DIAGNOSIS — S2242XD Multiple fractures of ribs, left side, subsequent encounter for fracture with routine healing: Secondary | ICD-10-CM | POA: Diagnosis not present

## 2019-08-17 DIAGNOSIS — W1830XA Fall on same level, unspecified, initial encounter: Secondary | ICD-10-CM | POA: Diagnosis present

## 2019-08-17 DIAGNOSIS — I10 Essential (primary) hypertension: Secondary | ICD-10-CM | POA: Diagnosis present

## 2019-08-17 DIAGNOSIS — M4856XA Collapsed vertebra, not elsewhere classified, lumbar region, initial encounter for fracture: Secondary | ICD-10-CM | POA: Diagnosis present

## 2019-08-17 DIAGNOSIS — S32010A Wedge compression fracture of first lumbar vertebra, initial encounter for closed fracture: Secondary | ICD-10-CM | POA: Diagnosis present

## 2019-08-17 DIAGNOSIS — E785 Hyperlipidemia, unspecified: Secondary | ICD-10-CM | POA: Diagnosis present

## 2019-08-17 DIAGNOSIS — M1990 Unspecified osteoarthritis, unspecified site: Secondary | ICD-10-CM | POA: Diagnosis present

## 2019-08-17 DIAGNOSIS — S72009A Fracture of unspecified part of neck of unspecified femur, initial encounter for closed fracture: Secondary | ICD-10-CM | POA: Diagnosis present

## 2019-08-17 DIAGNOSIS — M545 Low back pain: Secondary | ICD-10-CM | POA: Diagnosis present

## 2019-08-17 DIAGNOSIS — F0280 Dementia in other diseases classified elsewhere without behavioral disturbance: Secondary | ICD-10-CM | POA: Diagnosis present

## 2019-08-17 DIAGNOSIS — W19XXXA Unspecified fall, initial encounter: Secondary | ICD-10-CM

## 2019-08-17 DIAGNOSIS — N400 Enlarged prostate without lower urinary tract symptoms: Secondary | ICD-10-CM | POA: Diagnosis present

## 2019-08-17 DIAGNOSIS — Y92099 Unspecified place in other non-institutional residence as the place of occurrence of the external cause: Secondary | ICD-10-CM

## 2019-08-17 DIAGNOSIS — Z681 Body mass index (BMI) 19 or less, adult: Secondary | ICD-10-CM

## 2019-08-17 DIAGNOSIS — S72002A Fracture of unspecified part of neck of left femur, initial encounter for closed fracture: Secondary | ICD-10-CM

## 2019-08-17 DIAGNOSIS — G8929 Other chronic pain: Secondary | ICD-10-CM | POA: Diagnosis present

## 2019-08-17 DIAGNOSIS — E43 Unspecified severe protein-calorie malnutrition: Secondary | ICD-10-CM | POA: Diagnosis present

## 2019-08-17 DIAGNOSIS — R159 Full incontinence of feces: Secondary | ICD-10-CM | POA: Diagnosis present

## 2019-08-17 DIAGNOSIS — S7290XA Unspecified fracture of unspecified femur, initial encounter for closed fracture: Secondary | ICD-10-CM | POA: Diagnosis present

## 2019-08-17 DIAGNOSIS — N390 Urinary tract infection, site not specified: Secondary | ICD-10-CM | POA: Diagnosis present

## 2019-08-17 DIAGNOSIS — Y92009 Unspecified place in unspecified non-institutional (private) residence as the place of occurrence of the external cause: Secondary | ICD-10-CM | POA: Diagnosis not present

## 2019-08-17 DIAGNOSIS — R443 Hallucinations, unspecified: Secondary | ICD-10-CM | POA: Diagnosis present

## 2019-08-17 DIAGNOSIS — K219 Gastro-esophageal reflux disease without esophagitis: Secondary | ICD-10-CM | POA: Diagnosis present

## 2019-08-17 DIAGNOSIS — S72142A Displaced intertrochanteric fracture of left femur, initial encounter for closed fracture: Secondary | ICD-10-CM | POA: Diagnosis present

## 2019-08-17 DIAGNOSIS — T148XXA Other injury of unspecified body region, initial encounter: Secondary | ICD-10-CM

## 2019-08-17 LAB — COMPREHENSIVE METABOLIC PANEL
ALT: 12 U/L (ref 0–44)
AST: 21 U/L (ref 15–41)
Albumin: 3 g/dL — ABNORMAL LOW (ref 3.5–5.0)
Alkaline Phosphatase: 55 U/L (ref 38–126)
Anion gap: 8 (ref 5–15)
BUN: 24 mg/dL — ABNORMAL HIGH (ref 8–23)
CO2: 27 mmol/L (ref 22–32)
Calcium: 8.8 mg/dL — ABNORMAL LOW (ref 8.9–10.3)
Chloride: 102 mmol/L (ref 98–111)
Creatinine, Ser: 0.69 mg/dL (ref 0.61–1.24)
GFR calc Af Amer: >60 mL/min (ref >60)
GFR calc non Af Amer: >60 mL/min (ref >60)
Glucose, Bld: 89 mg/dL (ref 70–99)
Potassium: 3.8 mmol/L (ref 3.5–5.1)
Sodium: 137 mmol/L (ref 135–145)
Total Bilirubin: 1.1 mg/dL (ref 0.3–1.2)
Total Protein: 5.6 g/dL — ABNORMAL LOW (ref 6.5–8.1)

## 2019-08-17 LAB — CBC WITH DIFFERENTIAL/PLATELET
Abs Immature Granulocytes: 0.04 10*3/uL (ref 0.00–0.07)
Basophils Absolute: 0.1 10*3/uL (ref 0.0–0.1)
Basophils Relative: 1 %
Eosinophils Absolute: 0.2 10*3/uL (ref 0.0–0.5)
Eosinophils Relative: 2 %
HCT: 38.1 % — ABNORMAL LOW (ref 39.0–52.0)
Hemoglobin: 12.5 g/dL — ABNORMAL LOW (ref 13.0–17.0)
Immature Granulocytes: 1 %
Lymphocytes Relative: 14 %
Lymphs Abs: 1.1 10*3/uL (ref 0.7–4.0)
MCH: 32.1 pg (ref 26.0–34.0)
MCHC: 32.8 g/dL (ref 30.0–36.0)
MCV: 97.7 fL (ref 80.0–100.0)
Monocytes Absolute: 0.8 10*3/uL (ref 0.1–1.0)
Monocytes Relative: 11 %
Neutro Abs: 5.5 10*3/uL (ref 1.7–7.7)
Neutrophils Relative %: 71 %
Platelets: 276 10*3/uL (ref 150–400)
RBC: 3.9 MIL/uL — ABNORMAL LOW (ref 4.22–5.81)
RDW: 13.4 % (ref 11.5–15.5)
WBC: 7.6 10*3/uL (ref 4.0–10.5)
nRBC: 0 % (ref 0.0–0.2)

## 2019-08-17 LAB — URINALYSIS, ROUTINE W REFLEX MICROSCOPIC
Bilirubin Urine: NEGATIVE
Glucose, UA: NEGATIVE mg/dL
Hgb urine dipstick: NEGATIVE
Ketones, ur: 5 mg/dL — AB
Nitrite: POSITIVE — AB
Protein, ur: NEGATIVE mg/dL
Specific Gravity, Urine: 1.032 — ABNORMAL HIGH (ref 1.005–1.030)
pH: 5 (ref 5.0–8.0)

## 2019-08-17 LAB — SARS CORONAVIRUS 2 (TAT 6-24 HRS): SARS Coronavirus 2: NEGATIVE

## 2019-08-17 MED ORDER — SODIUM CHLORIDE 0.9 % IV SOLN
1.0000 g | Freq: Once | INTRAVENOUS | Status: AC
  Administered 2019-08-17: 1 g via INTRAVENOUS
  Filled 2019-08-17: qty 10

## 2019-08-17 MED ORDER — CARBIDOPA-LEVODOPA 25-100 MG PO TABS
1.0000 | ORAL_TABLET | Freq: Three times a day (TID) | ORAL | Status: DC
  Administered 2019-08-18 – 2019-08-22 (×11): 1 via ORAL
  Filled 2019-08-17 (×12): qty 1

## 2019-08-17 MED ORDER — LIDOCAINE 5 % EX PTCH
1.0000 | MEDICATED_PATCH | Freq: Every day | CUTANEOUS | Status: DC
  Administered 2019-08-18: 08:00:00 1 via TRANSDERMAL
  Filled 2019-08-17: qty 1

## 2019-08-17 MED ORDER — ESCITALOPRAM OXALATE 10 MG PO TABS
5.0000 mg | ORAL_TABLET | Freq: Every day | ORAL | Status: DC
  Administered 2019-08-18 – 2019-08-22 (×5): 5 mg via ORAL
  Filled 2019-08-17 (×5): qty 1

## 2019-08-17 MED ORDER — FAMOTIDINE 20 MG PO TABS
20.0000 mg | ORAL_TABLET | Freq: Two times a day (BID) | ORAL | Status: DC
  Administered 2019-08-18 – 2019-08-22 (×8): 20 mg via ORAL
  Filled 2019-08-17 (×8): qty 1

## 2019-08-17 MED ORDER — TAMSULOSIN HCL 0.4 MG PO CAPS
0.4000 mg | ORAL_CAPSULE | Freq: Every day | ORAL | Status: DC
  Administered 2019-08-18 – 2019-08-21 (×4): 0.4 mg via ORAL
  Filled 2019-08-17 (×4): qty 1

## 2019-08-17 MED ORDER — SODIUM CHLORIDE 0.9 % IV BOLUS
500.0000 mL | Freq: Once | INTRAVENOUS | Status: AC
  Administered 2019-08-17: 17:00:00 500 mL via INTRAVENOUS

## 2019-08-17 MED ORDER — SENNA 8.6 MG PO TABS
8.6000 mg | ORAL_TABLET | Freq: Every day | ORAL | Status: DC
  Administered 2019-08-18 – 2019-08-22 (×5): 8.6 mg via ORAL
  Filled 2019-08-17 (×5): qty 1

## 2019-08-17 MED ORDER — MORPHINE SULFATE (PF) 2 MG/ML IV SOLN: 0.5000 mg | INTRAVENOUS | Status: DC | PRN

## 2019-08-17 NOTE — ED Triage Notes (Signed)
Per EMS: Pt from [facility] due to a fall that happened on October 9th. No treatment was sought at that time.  Pt received an XR today (per staff) that showed a left hip fracture. Pt complaining of feeling "sleepy and nauseous".  HX: of Dementia.  Staff at facility reports pt is mentating at baseline.   EMS Vitals:  BP 138/90 HR 84 Sp02 96% RA  Temp: 99.7, repeat 97.5 CBG 94
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
## 2019-08-17 NOTE — ED Notes (Signed)
Patient transported to X-ray 

## 2019-08-18 ENCOUNTER — Inpatient Hospital Stay (HOSPITAL_COMMUNITY): Payer: Medicare Other

## 2019-08-18 ENCOUNTER — Inpatient Hospital Stay (HOSPITAL_COMMUNITY): Payer: Medicare Other | Admitting: Anesthesiology

## 2019-08-18 ENCOUNTER — Encounter (HOSPITAL_COMMUNITY)
Admission: EM | Disposition: A | Discharge status: Skilled Nursing Facility | Payer: Self-pay | Source: Skilled Nursing Facility | Attending: Internal Medicine

## 2019-08-18 ENCOUNTER — Encounter (HOSPITAL_COMMUNITY): Payer: Self-pay

## 2019-08-18 DIAGNOSIS — N390 Urinary tract infection, site not specified: Secondary | ICD-10-CM

## 2019-08-18 DIAGNOSIS — S2242XD Multiple fractures of ribs, left side, subsequent encounter for fracture with routine healing: Secondary | ICD-10-CM

## 2019-08-18 DIAGNOSIS — S32010D Wedge compression fracture of first lumbar vertebra, subsequent encounter for fracture with routine healing: Secondary | ICD-10-CM

## 2019-08-18 HISTORY — PX: INTRAMEDULLARY (IM) NAIL INTERTROCHANTERIC: SHX5875

## 2019-08-18 SURGERY — FIXATION, FRACTURE, INTERTROCHANTERIC, WITH INTRAMEDULLARY ROD
Anesthesia: General | Laterality: Left

## 2019-08-18 MED ORDER — SUCCINYLCHOLINE CHLORIDE 200 MG/10ML IV SOSY
PREFILLED_SYRINGE | INTRAVENOUS | Status: AC
  Filled 2019-08-18: qty 10

## 2019-08-18 MED ORDER — OXYCODONE HCL 5 MG/5ML PO SOLN: 5.0000 mg | Freq: Once | ORAL | Status: DC | PRN

## 2019-08-18 MED ORDER — ACETAMINOPHEN 325 MG PO TABS: 325.0000 mg | ORAL_TABLET | ORAL | Status: DC | PRN

## 2019-08-18 MED ORDER — ONDANSETRON HCL 4 MG/2ML IJ SOLN
INTRAMUSCULAR | Status: DC | PRN
  Administered 2019-08-18: 4 mg via INTRAVENOUS

## 2019-08-18 MED ORDER — ROCURONIUM BROMIDE 100 MG/10ML IV SOLN
INTRAVENOUS | Status: DC | PRN
  Administered 2019-08-18: 40 mg via INTRAVENOUS

## 2019-08-18 MED ORDER — SODIUM CHLORIDE 0.9 % IV SOLN
1.0000 g | INTRAVENOUS | Status: DC
  Administered 2019-08-18 – 2019-08-20 (×3): 1 g via INTRAVENOUS
  Filled 2019-08-18 (×3): qty 10

## 2019-08-18 MED ORDER — ONDANSETRON HCL 4 MG/2ML IJ SOLN
INTRAMUSCULAR | Status: AC
  Filled 2019-08-18: qty 2

## 2019-08-18 MED ORDER — DEXAMETHASONE SODIUM PHOSPHATE 10 MG/ML IJ SOLN
INTRAMUSCULAR | Status: AC
  Filled 2019-08-18: qty 1

## 2019-08-18 MED ORDER — 0.9 % SODIUM CHLORIDE (POUR BTL) OPTIME
TOPICAL | Status: DC | PRN
  Administered 2019-08-18: 1000 mL

## 2019-08-18 MED ORDER — LIDOCAINE HCL (CARDIAC) PF 100 MG/5ML IV SOSY
PREFILLED_SYRINGE | INTRAVENOUS | Status: DC | PRN
  Administered 2019-08-18: 100 mg via INTRAVENOUS

## 2019-08-18 MED ORDER — PHENYLEPHRINE HCL (PRESSORS) 10 MG/ML IV SOLN
INTRAVENOUS | Status: DC | PRN
  Administered 2019-08-18: 80 ug via INTRAVENOUS
  Administered 2019-08-18: 120 ug via INTRAVENOUS
  Administered 2019-08-18 (×3): 80 ug via INTRAVENOUS
  Administered 2019-08-18: 120 ug via INTRAVENOUS
  Administered 2019-08-18: 80 ug via INTRAVENOUS

## 2019-08-18 MED ORDER — FENTANYL CITRATE (PF) 250 MCG/5ML IJ SOLN
INTRAMUSCULAR | Status: AC
  Filled 2019-08-18: qty 5

## 2019-08-18 MED ORDER — FENTANYL CITRATE (PF) 100 MCG/2ML IJ SOLN
INTRAMUSCULAR | Status: DC | PRN
  Administered 2019-08-18 (×2): 50 ug via INTRAVENOUS

## 2019-08-18 MED ORDER — MIDAZOLAM HCL 2 MG/2ML IJ SOLN
INTRAMUSCULAR | Status: AC
  Filled 2019-08-18: qty 2

## 2019-08-18 MED ORDER — VANCOMYCIN HCL 500 MG IV SOLR
INTRAVENOUS | Status: DC | PRN
  Administered 2019-08-18: 500 mg via TOPICAL

## 2019-08-18 MED ORDER — PROPOFOL 10 MG/ML IV BOLUS
INTRAVENOUS | Status: DC | PRN
  Administered 2019-08-18: 80 mg via INTRAVENOUS

## 2019-08-18 MED ORDER — PHENYLEPHRINE HCL-NACL 10-0.9 MG/250ML-% IV SOLN
INTRAVENOUS | Status: AC
  Filled 2019-08-18: qty 250

## 2019-08-18 MED ORDER — PHENYLEPHRINE 40 MCG/ML (10ML) SYRINGE FOR IV PUSH (FOR BLOOD PRESSURE SUPPORT)
PREFILLED_SYRINGE | INTRAVENOUS | Status: AC
  Filled 2019-08-18: qty 10

## 2019-08-18 MED ORDER — LACTATED RINGERS IV SOLN
INTRAVENOUS | Status: DC
  Administered 2019-08-18 (×2): via INTRAVENOUS

## 2019-08-18 MED ORDER — LIDOCAINE 2% (20 MG/ML) 5 ML SYRINGE
INTRAMUSCULAR | Status: AC
  Filled 2019-08-18: qty 5

## 2019-08-18 MED ORDER — EPHEDRINE SULFATE 50 MG/ML IJ SOLN
INTRAMUSCULAR | Status: DC | PRN
  Administered 2019-08-18: 10 mg via INTRAVENOUS
  Administered 2019-08-18: 5 mg via INTRAVENOUS

## 2019-08-18 MED ORDER — TRAMADOL HCL 50 MG PO TABS
50.0000 mg | ORAL_TABLET | Freq: Four times a day (QID) | ORAL | Status: DC | PRN
  Administered 2019-08-22: 10:00:00 50 mg via ORAL
  Filled 2019-08-18: qty 1

## 2019-08-18 MED ORDER — OXYCODONE HCL 5 MG PO TABS: 5.0000 mg | ORAL_TABLET | Freq: Once | ORAL | Status: DC | PRN

## 2019-08-18 MED ORDER — ONDANSETRON HCL 4 MG/2ML IJ SOLN: 4.0000 mg | Freq: Once | INTRAMUSCULAR | Status: DC | PRN

## 2019-08-18 MED ORDER — ENOXAPARIN SODIUM 40 MG/0.4ML ~~LOC~~ SOLN
40.0000 mg | SUBCUTANEOUS | Status: DC
  Administered 2019-08-19 – 2019-08-22 (×4): 40 mg via SUBCUTANEOUS
  Filled 2019-08-18 (×4): qty 0.4

## 2019-08-18 MED ORDER — MORPHINE SULFATE (PF) 2 MG/ML IV SOLN: 0.5000 mg | INTRAVENOUS | Status: DC | PRN

## 2019-08-18 MED ORDER — POVIDONE-IODINE 10 % EX SWAB: 2.0000 "application " | Freq: Once | CUTANEOUS | Status: DC

## 2019-08-18 MED ORDER — ESMOLOL HCL 100 MG/10ML IV SOLN
INTRAVENOUS | Status: DC | PRN
  Administered 2019-08-18: 20 mg via INTRAVENOUS

## 2019-08-18 MED ORDER — ALBUMIN HUMAN 5 % IV SOLN
INTRAVENOUS | Status: DC | PRN
  Administered 2019-08-18: 16:00:00 via INTRAVENOUS

## 2019-08-18 MED ORDER — CEFAZOLIN SODIUM-DEXTROSE 2-4 GM/100ML-% IV SOLN
2.0000 g | Freq: Three times a day (TID) | INTRAVENOUS | Status: AC
  Administered 2019-08-18 – 2019-08-19 (×3): 2 g via INTRAVENOUS
  Filled 2019-08-18 (×3): qty 100

## 2019-08-18 MED ORDER — CEFAZOLIN SODIUM-DEXTROSE 2-4 GM/100ML-% IV SOLN
INTRAVENOUS | Status: AC
  Filled 2019-08-18: qty 100

## 2019-08-18 MED ORDER — ACETAMINOPHEN 160 MG/5ML PO SOLN: 325.0000 mg | ORAL | Status: DC | PRN

## 2019-08-18 MED ORDER — VANCOMYCIN HCL 500 MG IV SOLR
INTRAVENOUS | Status: AC
  Filled 2019-08-18: qty 500

## 2019-08-18 MED ORDER — EPHEDRINE 5 MG/ML INJ
INTRAVENOUS | Status: AC
  Filled 2019-08-18: qty 10

## 2019-08-18 MED ORDER — SUGAMMADEX SODIUM 200 MG/2ML IV SOLN
INTRAVENOUS | Status: DC | PRN
  Administered 2019-08-18: 200 mg via INTRAVENOUS

## 2019-08-18 MED ORDER — MEPERIDINE HCL 25 MG/ML IJ SOLN: 6.2500 mg | INTRAMUSCULAR | Status: DC | PRN

## 2019-08-18 MED ORDER — PROPOFOL 10 MG/ML IV BOLUS
INTRAVENOUS | Status: AC
  Filled 2019-08-18: qty 20

## 2019-08-18 MED ORDER — FENTANYL CITRATE (PF) 100 MCG/2ML IJ SOLN: 25.0000 ug | INTRAMUSCULAR | Status: DC | PRN

## 2019-08-18 MED ORDER — CEFAZOLIN SODIUM-DEXTROSE 2-4 GM/100ML-% IV SOLN
2.0000 g | INTRAVENOUS | Status: AC
  Administered 2019-08-18: 2 g via INTRAVENOUS

## 2019-08-18 MED ORDER — ROCURONIUM BROMIDE 10 MG/ML (PF) SYRINGE
PREFILLED_SYRINGE | INTRAVENOUS | Status: AC
  Filled 2019-08-18: qty 10

## 2019-08-18 MED ORDER — ACETAMINOPHEN 500 MG PO TABS
1000.0000 mg | ORAL_TABLET | Freq: Four times a day (QID) | ORAL | Status: DC
  Administered 2019-08-18 – 2019-08-22 (×13): 1000 mg via ORAL
  Filled 2019-08-18 (×14): qty 2

## 2019-08-18 MED ORDER — CHLORHEXIDINE GLUCONATE 4 % EX LIQD: 60.0000 mL | Freq: Once | CUTANEOUS | Status: DC

## 2019-08-18 MED ORDER — DEXAMETHASONE SODIUM PHOSPHATE 10 MG/ML IJ SOLN
INTRAMUSCULAR | Status: DC | PRN
  Administered 2019-08-18: 5 mg via INTRAVENOUS

## 2019-08-18 SURGICAL SUPPLY — 49 items
ADH SKN CLS APL DERMABOND .7 (GAUZE/BANDAGES/DRESSINGS) ×1
APL PRP STRL LF DISP 70% ISPRP (MISCELLANEOUS) ×1
BIT DRILL LONG 4.0 (BIT) IMPLANT
BRUSH SCRUB EZ PLAIN DRY (MISCELLANEOUS) ×6 IMPLANT
CHLORAPREP W/TINT 26 (MISCELLANEOUS) ×3 IMPLANT
COVER PERINEAL POST (MISCELLANEOUS) ×3 IMPLANT
COVER SURGICAL LIGHT HANDLE (MISCELLANEOUS) ×3 IMPLANT
DERMABOND ADVANCED (GAUZE/BANDAGES/DRESSINGS) ×2
DERMABOND ADVANCED .7 DNX12 (GAUZE/BANDAGES/DRESSINGS) ×1 IMPLANT
DRAPE C-ARM 35X43 STRL (DRAPES) ×3 IMPLANT
DRAPE C-ARM 42X72 X-RAY (DRAPES) ×2 IMPLANT
DRAPE IMP U-DRAPE 54X76 (DRAPES) ×6 IMPLANT
DRAPE INCISE IOBAN 66X45 STRL (DRAPES) ×3 IMPLANT
DRAPE STERI IOBAN 125X83 (DRAPES) ×3 IMPLANT
DRAPE SURG 17X23 STRL (DRAPES) ×6 IMPLANT
DRAPE U-SHAPE 47X51 STRL (DRAPES) ×3 IMPLANT
DRILL BIT LONG 4.0 (BIT) ×3
DRSG MEPILEX BORDER 4X4 (GAUZE/BANDAGES/DRESSINGS) ×3 IMPLANT
DRSG MEPILEX BORDER 4X8 (GAUZE/BANDAGES/DRESSINGS) ×3 IMPLANT
ELECT REM PT RETURN 9FT ADLT (ELECTROSURGICAL) ×3
ELECTRODE REM PT RTRN 9FT ADLT (ELECTROSURGICAL) ×1 IMPLANT
GLOVE BIO SURGEON STRL SZ 6.5 (GLOVE) ×6 IMPLANT
GLOVE BIO SURGEON STRL SZ7.5 (GLOVE) ×12 IMPLANT
GLOVE BIO SURGEONS STRL SZ 6.5 (GLOVE) ×3
GLOVE BIOGEL PI IND STRL 6.5 (GLOVE) ×1 IMPLANT
GLOVE BIOGEL PI IND STRL 7.5 (GLOVE) ×1 IMPLANT
GLOVE BIOGEL PI INDICATOR 6.5 (GLOVE) ×2
GLOVE BIOGEL PI INDICATOR 7.5 (GLOVE) ×2
GOWN STRL REUS W/ TWL LRG LVL3 (GOWN DISPOSABLE) ×1 IMPLANT
GOWN STRL REUS W/TWL LRG LVL3 (GOWN DISPOSABLE) ×3
GUIDE PIN 3.2X343 (PIN) ×2
GUIDE PIN 3.2X343MM (PIN) ×6
KIT BASIN OR (CUSTOM PROCEDURE TRAY) ×3 IMPLANT
KIT TURNOVER KIT B (KITS) ×3 IMPLANT
MANIFOLD NEPTUNE II (INSTRUMENTS) ×3 IMPLANT
NAIL INTERTAN 10X18 130D 10S (Nail) ×2 IMPLANT
NS IRRIG 1000ML POUR BTL (IV SOLUTION) ×3 IMPLANT
PACK GENERAL/GYN (CUSTOM PROCEDURE TRAY) ×3 IMPLANT
PAD ARMBOARD 7.5X6 YLW CONV (MISCELLANEOUS) ×6 IMPLANT
PIN GUIDE 3.2X343MM (PIN) IMPLANT
SCREW LAG COMPR KIT 95/90 (Screw) ×2 IMPLANT
SCREW TRIGEN LOW PROF 5.0X37.5 (Screw) ×2 IMPLANT
SUT MNCRL AB 3-0 PS2 18 (SUTURE) ×3 IMPLANT
SUT VIC AB 0 CT1 27 (SUTURE)
SUT VIC AB 0 CT1 27XBRD ANBCTR (SUTURE) IMPLANT
SUT VIC AB 2-0 CT1 27 (SUTURE) ×6
SUT VIC AB 2-0 CT1 TAPERPNT 27 (SUTURE) ×2 IMPLANT
TOWEL GREEN STERILE (TOWEL DISPOSABLE) ×6 IMPLANT
WATER STERILE IRR 1000ML POUR (IV SOLUTION) ×3 IMPLANT

## 2019-08-18 NOTE — Anesthesia Postprocedure Evaluation (Signed)
Anesthesia Post Note  Patient: Kevin Gould  Procedure(s) Performed: INTRAMEDULLARY (IM) NAIL INTERTROCHANTRIC (Left )     Patient location during evaluation: PACU Anesthesia Type: General Level of consciousness: awake and alert Pain management: pain level controlled Vital Signs Assessment: post-procedure vital signs reviewed and stable Respiratory status: spontaneous breathing, nonlabored ventilation, respiratory function stable and patient connected to nasal cannula oxygen Cardiovascular status: blood pressure returned to baseline and stable Postop Assessment: no apparent nausea or vomiting Anesthetic complications: no    Last Vitals:  Vitals:   08/18/19 1745 08/18/19 1749  BP:  (!) 171/99  Pulse: 72 70  Resp: 14 12  Temp:  (!) 36.1 C  SpO2: 95% 94%    Last Pain:  Vitals:   08/18/19 1749  TempSrc:   PainSc: 0-No pain                 Ryan P Ellender

## 2019-08-18 NOTE — Anesthesia Preprocedure Evaluation (Addendum)
Anesthesia Evaluation  Patient identified by MRN, date of birth, ID band Patient awake    Reviewed: Allergy & Precautions, H&P , NPO status , Patient's Chart, lab work & pertinent test results, reviewed documented beta blocker date and time   Airway Mallampati: II  TM Distance: >3 FB Neck ROM: full    Dental no notable dental hx. (+) Poor Dentition, Chipped, Missing,    Pulmonary neg pulmonary ROS, former smoker,    Pulmonary exam normal breath sounds clear to auscultation       Cardiovascular Exercise Tolerance: Good hypertension, negative cardio ROS   Rhythm:regular Rate:Normal     Neuro/Psych  Headaches, PSYCHIATRIC DISORDERS Dementia Parkinson's  Neuromuscular disease    GI/Hepatic Neg liver ROS, GERD  Medicated,  Endo/Other  negative endocrine ROS  Renal/GU negative Renal ROS  negative genitourinary   Musculoskeletal  (+) Arthritis , Osteoarthritis,    Abdominal   Peds  Hematology negative hematology ROS (+)   Anesthesia Other Findings   Reproductive/Obstetrics negative OB ROS                            Anesthesia Physical Anesthesia Plan  ASA: III and emergent  Anesthesia Plan: General   Post-op Pain Management:    Induction: Cricoid pressure planned  PONV Risk Score and Plan: 2 and Ondansetron, Treatment may vary due to age or medical condition and Dexamethasone  Airway Management Planned: Oral ETT and LMA  Additional Equipment:   Intra-op Plan:   Post-operative Plan:   Informed Consent: I have reviewed the patients History and Physical, chart, labs and discussed the procedure including the risks, benefits and alternatives for the proposed anesthesia with the patient or authorized representative who has indicated his/her understanding and acceptance.     Dental Advisory Given  Plan Discussed with: CRNA, Anesthesiologist and Surgeon  Anesthesia Plan Comments:          Anesthesia Quick Evaluation

## 2019-08-18 NOTE — ED Notes (Signed)
Breakfast Ordered 

## 2019-08-18 NOTE — ED Notes (Signed)
Tele ordered lunch 

## 2019-08-18 NOTE — Anesthesia Procedure Notes (Signed)
Procedure Name: Intubation Date/Time: 08/18/2019 3:46 PM Performed by: Jazz Biddy T, CRNA Pre-anesthesia Checklist: Patient identified, Emergency Drugs available, Suction available and Patient being monitored Patient Re-evaluated:Patient Re-evaluated prior to induction Oxygen Delivery Method: Circle system utilized Preoxygenation: Pre-oxygenation with 100% oxygen Induction Type: IV induction Ventilation: Mask ventilation without difficulty Laryngoscope Size: Miller and 3 Grade View: Grade I Tube type: Oral Tube size: 7.5 mm Number of attempts: 1 Airway Equipment and Method: Patient positioned with wedge pillow and Stylet Placement Confirmation: ETT inserted through vocal cords under direct vision,  positive ETCO2 and breath sounds checked- equal and bilateral Secured at: 22 cm Tube secured with: Tape Dental Injury: Teeth and Oropharynx as per pre-operative assessment

## 2019-08-18 NOTE — ED Notes (Signed)
TELE 

## 2019-08-18 NOTE — Progress Notes (Addendum)
PROGRESS NOTE    Kevin Gould  MVH:[id number] DOB: 1946/05/26 DOA: 08/17/2019 PCP: [name], MD    Brief Narrative:  Kevin Gould is a 73 y.o. male with history of advanced dementia, Parkinson's disease, BPH, hypertension was brought into the ER after patient was found with increasing pain in the left hip.  As per the report, patient had a mechanical fall on August 05, 2019 and has since had increasing pain recently as well as some nausea. Patient has advanced dementia and does not contribute much to the history.  In the ED, patient was not in distress, was able to follow commands.  CT of the head did not show any acute findings, x-rays done showed left proximal femur fracture and left-sided rib fracture involving the eighth ninth and also L1 compression fracture per on-call orthopedic surgeon Dr. Jena Gauss, who has been consulted and recommended admission for further management including surgery. UA concerning for UTI for which patient was started on ceftriaxone. Hemoglobin is decreased from previous and is around 12.5 creatinine 1.6, LFTs were normal, platelets 276.  COVID-19 negative.  Chest x-ray negative for acute findings. Acute EKG shows normal sinus rhythm.  Patient underwent surgery this afternoon.  Subjective: Patient seen on 5N postoperatively.  Reports feeling okay.  Denies significant pain but some discomfort.  Also denies fever/chills, chest pain, SOB, N/V/D.    Assessment & Plan:   Principal Problem:   Closed fracture of left femur, unspecified fracture morphology, initial encounter [facility]) Active Problems:   Essential hypertension   Parkinson's disease (HCC)   Hip fracture (HCC)   Closed compression fracture of L1 vertebra (HCC)   Left rib fracture   Femur fracture (HCC)   Left femur fracture - s/p surgical repair 10/22 with Dr. Jena Gauss Fracture of Left Ribs 8 and 9 L1 Compression Fracture ED discussed plan with patient's cousin Ms. Juliann Pulse.   Above injuries  secondary to mechanical fall at home on 10/9.   - incentive spirometry  - ortho following - pain management - bowel regimen  Urinary Tract Infection - unclear if patient symptomatic at this time - f/u urine culture - continue Rocephin empirically for now  Possible Atrial Fibrillation - unknown if prior diagnosis but pulse irregular on exam postoperatively.  - monitor on telemetry for now  History of Parkinson's - continue on Sinemet.  Advanced dementia - stable, unknown if behavioral issues  Normocytic normochromic anemia  - monitor CBC's - expect some decrease following surgery  BPH - continue home Flomax  History of Hypertension - not on medications at home - monitor BP and will treat as needed if elevated     DVT prophylaxis: Lovenox Code Status:   Code Status: Full Code Family Communication: none at bedside Disposition Plan:  Likely to SNF/rehab, pending ortho clearance and PT evaluation   Consultants:   Ortho, Dr. Jena Gauss  Procedures:  10/22: Cephalomedullary nailing of left intertrochanteric femur fracture  Antimicrobials:   Rocephin, start 10/21 PM, stop likely 10/26 sooner if urine culture negative   Objective: Vitals:   08/17/19 2200 08/17/19 2302 08/18/19 0200 08/18/19 0829  BP: (!) 126/94  (!) 134/95 (!) 157/101  Pulse: 77 73  73  Resp: Temp:      TempSrc:      SpO2: 99% 95%  100%    Intake/Output Summary (Last 24 hours) at 08/18/2019 1439 Last data filed at 08/18/2019 0441 Gross per 24 hour  Intake 100 ml  Output 100  ml  Net 0 ml   There were no vitals filed for this visit.  Examination:  General exam: awake, alert, no acute distress, underweight, frail HEENT: atraumatic, clear conjunctiva, anicteric sclera, dry mucus membranes Respiratory system: clear bilaterally but decreased breath sounds due to shallow respirations, no wheezes, rales or rhonchi, normal respiratory effort. Cardiovascular system: normal S1/S2, irregular  rhythm, normal rate, no JVD, murmurs, rubs, gallops, no pedal edema.   Gastrointestinal system: soft, non-tender, non-distended  Central nervous system: no gross focal neurologic deficits, normal speech Extremities: moves all, no edema, normal tone Skin: dry, intact, normal temperature Psychiatry: normal mood, congruent affect, abnormal insight due to dementia   Data Reviewed: I have personally reviewed following labs and imaging studies  CBC: Recent Labs  Lab 08/17/19 1602  WBC 7.6  NEUTROABS 5.5  HGB 12.5*  HCT 38.1*  MCV 97.7  PLT 833   Basic Metabolic Panel: Recent Labs  Lab 08/17/19 1602  NA 137  K 3.8  CL 102  CO2 27  GLUCOSE 89  BUN 24*  CREATININE 0.69  CALCIUM 8.8*   GFR: CrCl cannot be calculated (Unknown ideal weight.). Liver Function Tests: Recent Labs  Lab 08/17/19 1602  AST 21  ALT 12  ALKPHOS 55  BILITOT 1.1  PROT 5.6*  ALBUMIN 3.0*   No results for input(s): LIPASE, AMYLASE in the last 168 hours. No results for input(s): AMMONIA in the last 168 hours. Coagulation Profile: No results for input(s): INR, PROTIME in the last 168 hours. Cardiac Enzymes: No results for input(s): CKTOTAL, CKMB, CKMBINDEX, TROPONINI in the last 168 hours. BNP (last 3 results) No results for input(s): PROBNP in the last 8760 hours. HbA1C: No results for input(s): HGBA1C in the last 72 hours. CBG: No results for input(s): GLUCAP in the last 168 hours. Lipid Profile: No results for input(s): CHOL, HDL, LDLCALC, TRIG, CHOLHDL, LDLDIRECT in the last 72 hours. Thyroid Function Tests: No results for input(s): TSH, T4TOTAL, FREET4, T3FREE, THYROIDAB in the last 72 hours. Anemia Panel: No results for input(s): VITAMINB12, FOLATE, FERRITIN, TIBC, IRON, RETICCTPCT in the last 72 hours. Sepsis Labs: No results for input(s): PROCALCITON, LATICACIDVEN in the last 168 hours.  Recent Results (from the past 240 hour(s))  SARS CORONAVIRUS 2 (TAT 6-24 HRS) Nasopharyngeal  Nasopharyngeal Swab     Status: None   Collection Time: 08/17/19  4:37 PM   Specimen: Nasopharyngeal Swab  Result Value Ref Range Status   SARS Coronavirus 2 NEGATIVE NEGATIVE Final    Comment: (NOTE) SARS-CoV-2 target nucleic acids are NOT DETECTED. The SARS-CoV-2 RNA is generally detectable in upper and lower respiratory specimens during the acute phase of infection. Negative results do not preclude SARS-CoV-2 infection, do not rule out co-infections with other pathogens, and should not be used as the sole basis for treatment or other patient management decisions. Negative results must be combined with clinical observations, patient history, and epidemiological information. The expected result is Negative. Fact Sheet for Patients: SugarRoll.be Fact Sheet for Healthcare Providers: https://www.woods-mathews.com/ This test is not yet approved or cleared by the [place] FDA and  has been authorized for detection and/or diagnosis of SARS-CoV-2 by FDA under an Emergency Use Authorization (EUA). This EUA will remain  in effect (meaning this test can be used) for the duration of the COVID-19 declaration under Section 56 4(b)(1) of the Act, 21 U.S.C. section 360bbb-3(b)(1), unless the authorization is terminated or revoked sooner. Performed at [facility], [place] [address], [place], [place]  [address]          Radiology Studies: Dg Chest 1 View  Result Date: 08/17/2019 CLINICAL DATA:  Pain secondary to a fall on 08/05/2019. Proximal left femur fracture. L1 fracture. EXAM: CHEST  1 VIEW COMPARISON:  Chest x-ray dated 06/16/2019 FINDINGS: The heart size and pulmonary vascularity are normal. Aortic atherosclerosis. No infiltrates or effusions. Chronic elevation of the left hemidiaphragm. There appear to be 2 fractures of the lateral aspect of the left eighth and ninth ribs. IMPRESSION: 1. Fractures of the lateral aspects of the left  eighth and ninth ribs. 2. Aortic atherosclerosis. 3. Chronic elevation of the left hemidiaphragm. Electronically Signed   By: Francene Boyers M.D.   On: 08/17/2019 19:00   Dg Lumbar Spine Complete  Result Date: 08/17/2019 CLINICAL DATA:  Left hip pain secondary to a fall on 08/05/2019. EXAM: LUMBAR SPINE - COMPLETE 4+ VIEW COMPARISON:  None. FINDINGS: There is a mild compression fracture of the superior endplate of L1, age indeterminate. The remainder of the lumbar spine appears normal. IMPRESSION: Mild compression fracture of the superior endplate of L1, age indeterminate. Electronically Signed   By: Francene Boyers M.D.   On: 08/17/2019 18:52   Ct Head Wo Contrast  Result Date: 08/17/2019 CLINICAL DATA:  Fall 10 days ago with trauma to the head. EXAM: CT HEAD WITHOUT CONTRAST TECHNIQUE: Contiguous axial images were obtained from the base of the skull through the vertex without intravenous contrast. COMPARISON:  06/16/2019 FINDINGS: Brain: The study suffers from motion artifact. There is chronic ventriculomegaly of the lateral and third ventricles which appears unchanged from the previous studies. 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 No sign of acute infarction, mass lesion, hemorrhage or extra-axial collection. Vascular: There is atherosclerotic calcification of the major vessels at the base of the brain. Skull: Negative Sinuses/Orbits: Clear/normal Other: None IMPRESSION: No acute or traumatic finding by CT. Chronic ventriculomegaly of the lateral and third ventricles. 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 Electronically Signed   By: Paulina Fusi M.D.   On: 08/17/2019 18:43   Dg Shoulder Left   Result Date: 08/17/2019 CLINICAL DATA:  Pain secondary to a fall on 08/05/2019 EXAM: LEFT SHOULDER - 2+ VIEW COMPARISON:  None. FINDINGS: There is no evidence of fracture or dislocation. There is no evidence of arthropathy or other focal bone abnormality. Soft tissues are unremarkable. IMPRESSION: Negative. Electronically Signed   By: Francene Boyers M.D.   On: 08/17/2019 18:54   Dg Knee Complete 4 Views Left  Result Date: 08/17/2019 CLINICAL DATA:  Pain secondary to a fall on 08/05/2019 EXAM: LEFT KNEE - COMPLETE 4+ VIEW COMPARISON:  None. FINDINGS: No evidence of fracture, dislocation, or joint effusion. No evidence of arthropathy or other focal bone abnormality. Soft tissues are unremarkable. IMPRESSION: Negative. Electronically Signed   By: Francene Boyers M.D.   On: 08/17/2019 18:53   Dg Hip Unilat W Or Wo Pelvis 2-3 Views Left  Result Date: 08/17/2019 CLINICAL DATA:  Left hip pain since a fall dated 08/05/2019. EXAM: DG HIP (WITH OR WITHOUT PELVIS) 2-3V LEFT COMPARISON:  CT scan of the left hip dated 06/17/2019 FINDINGS: There is an angulated fracture of the proximal left femur involving the greater  trochanter and extending into the intertrochanteric region. The fracture only extends into proximal aspect of the lesser trochanter. Pelvic bones are normal. IMPRESSION: Angulated fracture of the proximal left femur as described. Electronically Signed   By: Francene BoyersJames  Maxwell M.D.   On: 08/17/2019 18:49        Scheduled Meds: . [MAR Hold] carbidopa-levodopa  1 tablet Oral TID  . chlorhexidine  60 mL Topical Once  . [MAR Hold] escitalopram  5 mg Oral Daily  . [MAR Hold] famotidine  20 mg Oral BID  . [MAR Hold] lidocaine  1 patch Transdermal Daily  . povidone-iodine  2 application Topical Once  . [MAR Hold] senna  8.6 mg Oral Daily  . [MAR Hold] tamsulosin  0.4 mg Oral QHS   Continuous Infusions: . ceFAZolin    .  ceFAZolin (ANCEF) IV    . [MAR Hold] cefTRIAXone (ROCEPHIN)  IV    .  lactated ringers 10 mL/hr at 08/18/19 1158     LOS: 1 day    Time spent: 30-35 mintues    Pennie BanterKelly A Ebany Bowermaster, DO [facility] Pager: [phone]  If 7PM-7AM, please contact night-coverage [email or web address] Password [email or web address] 08/18/2019, 2:39 PM

## 2019-08-18 NOTE — ED Notes (Signed)
Family at bedside. 

## 2019-08-18 NOTE — Transfer of Care (Signed)
Immediate Anesthesia Transfer of Care Note  Patient: Kevin Gould  Procedure(s) Performed: INTRAMEDULLARY (IM) NAIL INTERTROCHANTRIC (Left )  Patient Location: PACU  Anesthesia Type:General  Level of Consciousness: drowsy  Airway & Oxygen Therapy: Patient Spontanous Breathing and Patient connected to nasal cannula oxygen  Post-op Assessment: Report given to RN, Post -op Vital signs reviewed and stable and Patient moving all extremities  Post vital signs: Reviewed and stable  Last Vitals:  Vitals Value Taken Time  BP 169/112 08/18/19 1711  Temp    Pulse 71 08/18/19 1717  Resp 14 08/18/19 1717  SpO2 100 % 08/18/19 1717  Vitals shown include unvalidated device data.  Last Pain:  Vitals:   08/18/19 1711  TempSrc:   PainSc: (P) Asleep         Complications: No apparent anesthesia complications

## 2019-08-18 NOTE — ED Notes (Addendum)
Report given to Horris Latino, Therapist, sports. Pt's brief changed bc wet.

## 2019-08-18 NOTE — Op Note (Signed)
Orthopaedic Surgery Operative Note (CSN: [id number] ) Date of Surgery: 08/18/2019  Admit Date: 08/17/2019   Diagnoses: Pre-Op Diagnoses: Left intertrochanteric femur fracture   Post-Op Diagnosis: Same  Procedures: CPT 27245-Cephalomedullary nailing of left intertrochanteric femur fracture  Surgeons : Primary: Shona Needles, MD  Assistant: Patrecia Pace, PA-C  Location: OR 6   Anesthesia:General   Antibiotics: Ancef 2g preop   Tourniquet time:None    Estimated Blood Loss:   Complications: None   Specimens:None   Implants: Implant Name Type Inv. Item Serial No. Manufacturer Lot No. LRB No. Used Action  NAIL INTERTAN 10X18 130D 10S - [id number] Nail NAIL INTERTAN 10X18 130D 10S  [employer] [id number] Left 1 Implanted  SCREW LAG COMBO 95.90 - [id number] Screw SCREW LAG COMBO 95.90  [employer] [id number] Left 1 Implanted  SCREW TRIGEN LOW PROF 5.0X37.5 - [id number] Screw SCREW TRIGEN LOW PROF 5.0X37.5  [employer] [id number] Left 1 Implanted     Indications for Surgery: 73 year old male with a history of Parkinson's and dementia that presented after a ground-level fall at his assisted living facility and was found to have an intertrochanteric femur fracture.  Due to the displaced nature of his fracture and need for immobilization to decrease morbidity mortality was recommended to proceed with surgical fixation.  I discussed this need with the patient.  He seemed to understand the need for surgery and agreed to proceed with surgery.  I discussed this with one of his family members over the phone.  His healthcare power of attorney was contacted multiple times but no answer and no ability to leave a message was provided on the contact number.  Due to the patient agreeing to proceed with surgery as well as the urgent need to fix his hip fracture to decrease morbidity mortality proceeded with surgical fixation.  Operative  Findings: Cephalomedullary nailing of left intertrochanteric femur fracture using [employer] 10x143mm nail with 44mm combo lag screw  Procedure: The patient was identified in the preoperative holding area. Consent was confirmed with the patient and their family and all questions were answered. The operative extremity was marked after confirmation with the patient and they were then brought back to the operating room by our anesthesia colleagues. The patient was placed under general anesthesia and then carefully transferred over to a Hana table. The feet were secured into a traction boot and well padded. A post was placed in the groin and traction was pulled on the operative leg. The contralateral leg was positioned out of the way of fluoroscopy and secure . Fluoroscopic images were obtained and traction and manipulation was performed to reduce the fracture. Once adequate reduction was performed then the operative extremity was prepped and draped in sterile fashion. Preincision timeout was performed to verify the patient, the procedure and the extremity. Preoperative antibiotics were dosed.  A small incision was made proximal to the greater trochanter. A curved Mayo scissors was used to spread down to the greater trochanter in line with the abductor musculature. A threaded guidepin was positioned at an appropriate starting point on the AP and lateral views. It was advanced in the femur past the lesser trochanter. A entry reamer with soft tissue protector was then used to enter the canal. A short nail was placed into the canal and seated down to an appropriate position radiographically. The targeting arm for the lag screw was attached. A percutaneous incision was made for the guide for the lag screw. A  threaded guidepin was placed into the femoral neck and head and fluoroscopy was used to confirm adequate placement with an acceptable tip-apex distance. The compression screw path was drilled and a  anti-rotation bar was placed. The lag screw path was drilled and the lag screw was placed getting excellent fixation. The compression screw was then placed and compression was obtained at the fracture. Using the targeting arm a distal interlock was placed bicortically in the shaft.  Final fluoroscopic images were obtained and the incisions were copiously irrigated. The skin was closed with 2-0 vicryl, 3-0 monocryl and sealed with dermabond. The incisions were dressing with Mepilex dressings. The patient was carefully transferred to the regular floor bed and was taken to PACU in stable condition.  Post Op Plan/Instructions: The patient will be weightbearing as tolerated to left lower extremity.  He received postoperative Ancef for surgical prophylaxis.  He will receive Lovenox for DVT prophylaxis while in the hospital may be discharged on aspirin 325 mg.  We will have him mobilize with physical and Occupational Therapy.  I was present and performed the entire surgery.  Ulyses Southward, PA-C did assist me throughout the case. An assistant was necessary given the difficulty in approach, maintenance of reduction and ability to instrument the fracture.   Truitt Merle, MD [facility]
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
## 2019-08-19 ENCOUNTER — Encounter (HOSPITAL_COMMUNITY): Payer: Self-pay | Admitting: Student

## 2019-08-19 DIAGNOSIS — E43 Unspecified severe protein-calorie malnutrition: Secondary | ICD-10-CM | POA: Diagnosis present

## 2019-08-19 DIAGNOSIS — I10 Essential (primary) hypertension: Secondary | ICD-10-CM

## 2019-08-19 LAB — CBC
HCT: 34.2 % — ABNORMAL LOW (ref 39.0–52.0)
Hemoglobin: 11.4 g/dL — ABNORMAL LOW (ref 13.0–17.0)
MCH: 32.1 pg (ref 26.0–34.0)
MCHC: 33.3 g/dL (ref 30.0–36.0)
MCV: 96.3 fL (ref 80.0–100.0)
Platelets: 292 10*3/uL (ref 150–400)
RBC: 3.55 MIL/uL — ABNORMAL LOW (ref 4.22–5.81)
RDW: 13.1 % (ref 11.5–15.5)
WBC: 7.7 10*3/uL (ref 4.0–10.5)
nRBC: 0 % (ref 0.0–0.2)

## 2019-08-19 LAB — BASIC METABOLIC PANEL
Anion gap: 10 (ref 5–15)
BUN: 21 mg/dL (ref 8–23)
CO2: 25 mmol/L (ref 22–32)
Calcium: 8.8 mg/dL — ABNORMAL LOW (ref 8.9–10.3)
Chloride: 101 mmol/L (ref 98–111)
Creatinine, Ser: 0.63 mg/dL (ref 0.61–1.24)
GFR calc Af Amer: >60 mL/min (ref >60)
GFR calc non Af Amer: >60 mL/min (ref >60)
Glucose, Bld: 114 mg/dL — ABNORMAL HIGH (ref 70–99)
Potassium: 4.3 mmol/L (ref 3.5–5.1)
Sodium: 136 mmol/L (ref 135–145)

## 2019-08-19 LAB — MRSA PCR SCREENING: MRSA by PCR: NEGATIVE

## 2019-08-19 LAB — VITAMIN D 25 HYDROXY (VIT D DEFICIENCY, FRACTURES): Vit D, 25-Hydroxy: 19.38 ng/mL — ABNORMAL LOW (ref 30–100)

## 2019-08-19 MED ORDER — ADULT MULTIVITAMIN W/MINERALS CH
1.0000 | ORAL_TABLET | Freq: Every day | ORAL | Status: DC
  Administered 2019-08-19 – 2019-08-22 (×4): 1 via ORAL
  Filled 2019-08-19 (×4): qty 1

## 2019-08-19 MED ORDER — ENSURE ENLIVE PO LIQD
237.0000 mL | Freq: Two times a day (BID) | ORAL | Status: DC
  Administered 2019-08-20 – 2019-08-22 (×5): 237 mL via ORAL

## 2019-08-19 MED ORDER — VITAMIN D 25 MCG (1000 UNIT) PO TABS
2000.0000 [IU] | ORAL_TABLET | Freq: Two times a day (BID) | ORAL | Status: DC
  Administered 2019-08-19 (×2): 2000 [IU] via ORAL
  Filled 2019-08-19 (×2): qty 2

## 2019-08-19 MED ORDER — WHITE PETROLATUM EX OINT
TOPICAL_OINTMENT | CUTANEOUS | Status: AC
  Administered 2019-08-19: 0.2
  Filled 2019-08-19: qty 28.35

## 2019-08-19 NOTE — Progress Notes (Signed)
   08/19/19 1614  SNF Authorization Status  SNF Authorization Type Transition of Care CM/SW Authorization Request  SNF Auth Started 08/19/19  SNF Auth Start Time 1614  SNF Authorization Status Started

## 2019-08-19 NOTE — Plan of Care (Signed)
  Problem: Education: Goal: Verbalization of understanding the information provided (i.e., activity precautions, restrictions, etc) will improve Outcome: Progressing   

## 2019-08-19 NOTE — NC FL2 (Signed)
[place] MEDICAID FL2 LEVEL OF CARE SCREENING TOOL     IDENTIFICATION  Patient Name: Kevin Gould Birthdate: 10/02/1946 Sex: male Admission Date (Current Location): 08/17/2019  [facility] and [place] Number:  Producer, television/film/video and Address:  [facility] [place], [address] [place], [place] [address]      Provider Number: [id number]  Attending Physician Name and Address:  Pennie Banter, DO  Relative Name and Phone Number:  Dalbert Batman [phone]    Current Level of Care: Hospital Recommended Level of Care: Skilled Nursing Facility Prior Approval Number:    Date Approved/Denied:   PASRR Number: [id number]  Discharge Plan: SNF    Current Diagnoses: Patient Active Problem List   Diagnosis Date Noted  . Protein-calorie malnutrition, severe 08/19/2019  . Acute lower UTI   . Hip fracture (HCC) 08/17/2019  . Closed fracture of left femur, unspecified fracture morphology, initial encounter (HCC) 08/17/2019  . Closed compression fracture of L1 vertebra (HCC) 08/17/2019  . Left rib fracture 08/17/2019  . Femur fracture (HCC) 08/17/2019  . Pressure injury of skin 06/19/2019  . Parkinson's disease (HCC) 12/25/2014  . Hydrocephalus, acquired (HCC) 12/25/2014  . Rhabdomyolysis 11/30/2013  . Hypotension 11/30/2013  . Frequent falls 11/30/2013  . Memory problem 11/30/2013  . CERUMEN IMPACTION, BILATERAL 12/11/2010  . Essential hypertension 01/23/2009  . HYPERCHOLESTEROLEMIA 07/25/2008  . NEUROPATHY 07/17/2008  . LOW BACK PAIN, MILD 07/17/2008  . INSOMNIA 07/17/2008  . GERD 08/27/2004  . PARESTHESIA 09/03/1994    Orientation RESPIRATION BLADDER Height & Weight     Self  Normal Incontinent, External catheter(condom cath) Weight: 57.7 kg(06/16/19) Height:  5\' 8"  (172.7 cm)  BEHAVIORAL SYMPTOMS/MOOD NEUROLOGICAL BOWEL NUTRITION STATUS  Other (Comment)(N/A) (N/A) Continent Diet(Heart healthy)  AMBULATORY STATUS COMMUNICATION OF NEEDS Skin    Extensive Assist Verbally Surgical wounds, Skin abrasions(Left hip incision; LLE abrasions)                       Personal Care Assistance Level of Assistance  Total care, Bathing, Feeding, Dressing Bathing Assistance: Maximum assistance Feeding assistance: Maximum assistance Dressing Assistance: Maximum assistance Total Care Assistance: Maximum assistance   Functional Limitations Info  Sight, Hearing, Speech Sight Info: Adequate Hearing Info: Adequate Speech Info: Adequate    SPECIAL CARE FACTORS FREQUENCY  PT (By licensed PT), OT (By licensed OT)     PT Frequency: Min 2X/week OT Frequency: Min 2X/week            Contractures Contractures Info: Not present    Additional Factors Info  Code Status, Allergies Code Status Info: Full Code Allergies Info: NKDA           Current Medications (08/19/2019):  This is the current hospital active medication list Current Facility-Administered Medications  Medication Dose Route Frequency Provider Last Rate Last Dose  . acetaminophen (TYLENOL) tablet 1,000 mg  1,000 mg Oral Q6H 08/21/2019 A, PA-C   1,000 mg at 08/19/19 1318  . carbidopa-levodopa (SINEMET IR) 25-100 MG per tablet immediate release 1 tablet  1 tablet Oral TID 08/21/19 A, PA-C   1 tablet at 08/19/19 1318  . cefTRIAXone (ROCEPHIN) 1 g in sodium chloride 0.9 % 100 mL IVPB  1 g Intravenous Q24H 08/21/19 A, PA-C 200 mL/hr at 08/18/19 2127 1 g at 08/18/19 2127  . cholecalciferol (VITAMIN D3) tablet 2,000 Units  2,000 Units Oral BID Haddix, 2128, MD      . enoxaparin (LOVENOX) injection  40 mg  40 mg Subcutaneous Q24H Patrecia Pace A, PA-C   40 mg at 08/19/19 1005  . escitalopram (LEXAPRO) tablet 5 mg  5 mg Oral Daily Patrecia Pace A, PA-C   5 mg at 08/19/19 1004  . famotidine (PEPCID) tablet 20 mg  20 mg Oral BID Patrecia Pace A, PA-C   20 mg at 08/19/19 1003  . feeding supplement (ENSURE ENLIVE) (ENSURE ENLIVE) liquid 237 mL  237 mL Oral BID BM  Nicole Kindred A, DO      . lactated ringers infusion   Intravenous Continuous Delray Alt, PA-C 10 mL/hr at 08/18/19 1158    . morphine 2 MG/ML injection 0.5-1 mg  0.5-1 mg Intravenous Q2H PRN Delray Alt, PA-C      . multivitamin with minerals tablet 1 tablet  1 tablet Oral Daily Nicole Kindred A, DO   1 tablet at 08/19/19 1318  . senna (SENOKOT) tablet 8.6 mg  8.6 mg Oral Daily Patrecia Pace A, PA-C   8.6 mg at 08/19/19 1003  . tamsulosin (FLOMAX) capsule 0.4 mg  0.4 mg Oral QHS Patrecia Pace A, PA-C   0.4 mg at 08/18/19 2129  . traMADol (ULTRAM) tablet 50 mg  50 mg Oral Q6H PRN Delray Alt, PA-C         Discharge Medications: Please see discharge summary for a list of discharge medications.  Relevant Imaging Results:  Relevant Lab Results:   Additional Information SSN: [id number]  [place], BSN, NCM-BC, ACM-RN [phone]

## 2019-08-19 NOTE — Care Management (Signed)
CM contacted the patients listed contact, Vira Browns to discuss the POC, with no answer. CM team will continue to follow.    Midge Minium RN, BSN, NCM-BC, ACM-RN [phone]

## 2019-08-19 NOTE — Progress Notes (Signed)
Initial Nutrition Assessment  DOCUMENTATION CODES:   Severe malnutrition in context of chronic illness  INTERVENTION:   -MVI with minerals daily -Ensure Enlive po BID, each supplement provides 350 kcal and 20 grams of protein -Magic cup TID with meals, each supplement provides 290 kcal and 9 grams of protein  NUTRITION DIAGNOSIS:   Severe Malnutrition related to chronic illness(dementia) as evidenced by severe fat depletion, severe muscle depletion.  GOAL:   Patient will meet greater than or equal to 90% of their needs  MONITOR:   PO intake, Supplement acceptance, Labs, Weight trends, Skin, I & O's  REASON FOR ASSESSMENT:   Consult Hip fracture protocol  ASSESSMENT:   Kevin Gould is a 73 y.o. male with history of advanced dementia, Parkinson's disease, BPH, hypertension was brought into the ER after patient was found increasing pain in the left hip.  As per the report patient had a fall on August 05, 2019.  Patient was noticed to have increasing pain recently and also was complaining of some nausea.  Patient has advanced dementia and does not contribute much to the history.  Pt admitted with lt intertochanteric femur frx.   10/22- s/p Procedures:Cephalomedullary nailing of left intertrochanteric femur fracture  Reviewed I/O's: +1.7 L x 24 hours  Spoke with pt, who was sitting in recliner chair at time of visit. Pt pleasant and in good spirits ("I feel really good, especially after a tough surgery like I had"). Pt reports good appetite and consumed most of his breakfast (documented meal intake recorded at 75%). He shares he had a good appetite PTA and was residing at [facility]. He was served 3 meals per day and consumed most of the food he was provided ("the food there is good; it's like world class chefs made it!").   No recent wt available at this time (RD unable to obtain new wt, as pt was in recliner chair at time of visit. Last wt recorded on (06/16/19); this  wt was used to calculate nutritional needs. Noted pt experienced a 15.1% wt loss over the past 4 months prior to most recent recorded wt, which is significant for time frame. Pt is unsure of UBW or duration of weight loss ("I can't remember the last time I weighed myself").   Discussed importance of good meal and supplement intake to promote healing.  Labs reviewed.   NUTRITION - FOCUSED PHYSICAL EXAM:    Most Recent Value  Orbital Region  Severe depletion  Upper Arm Region  Severe depletion  Thoracic and Lumbar Region  Severe depletion  Buccal Region  Severe depletion  Temple Region  Severe depletion  Clavicle Bone Region  Severe depletion  Clavicle and Acromion Bone Region  Severe depletion  Scapular Bone Region  Severe depletion  Dorsal Hand  Severe depletion  Patellar Region  Severe depletion  Anterior Thigh Region  Severe depletion  Posterior Calf Region  Severe depletion  Edema (RD Assessment)  None  Hair  Reviewed  Eyes  Reviewed  Mouth  Reviewed  Skin  Reviewed  Nails  Reviewed       Diet Order:   Diet Order            Diet Heart Room service appropriate? Yes; Fluid consistency: Thin  Diet effective now              EDUCATION NEEDS:   Education needs have been addressed  Skin:  Skin Assessment: Skin Integrity Issues: Skin Integrity Issues:: Incisions Incisions: closed lt  hip  Last BM:  08/18/19  Height:   Ht Readings from Last 1 Encounters:  08/19/19 5\' 8"  (1.727 m)    Weight:   Wt Readings from Last 1 Encounters:  08/19/19 57.7 kg    Ideal Body Weight:  70 kg  BMI:  Body mass index is 19.34 kg/m.  Estimated Nutritional Needs:   Kcal:  1800-2000  Protein:  100-115 grams  Fluid:  > 1.8 L    Donalda Job A. 08/21/19, RD, LDN, CDCES Registered Dietitian II Certified Diabetes Care and Education Specialist Pager: [phone] After hours Pager: [phone]

## 2019-08-19 NOTE — Progress Notes (Signed)
PROGRESS NOTE    Kevin Gould  WUJ:[id number] DOB: [birth year] DOA: 08/17/2019 PCP: [name], MD    Brief Narrative:  Kevin Cowden Cummingsis a 73 y.o.malewithhistory of advanced dementia, Parkinson's disease, BPH, hypertension was brought into the ER after patient was found with increasing pain in the left hip. As per the report, patient had a mechanical fall on August 05, 2019 and has since had increasing pain recently as well as some nausea. Patient has advanced dementia and does not contribute much to the history.  In the ED, patient was not in distress, was able to follow commands. CT of the head did not show any acute findings, x-rays done showed left proximal femur fracture and left-sided rib fracture involving the eighth ninth and also L1 compression fracture per on-call orthopedic surgeon Dr. Jena Gauss, who has been consulted and recommended admission for further management including surgery. UA concerning for UTI for which patient was started on ceftriaxone. Hemoglobin is decreased from previous and is around 12.5 creatinine 1.6, LFTs were normal, platelets 276.  COVID-19 negative. Chest x-ray negative for acute findings. Acute EKG shows normal sinus rhythm.  Patient underwent surgery afternoon of 10/22.  Subjective 10/23: Patient seen and examined, sitting up awake in bed.  No acute events overnight.  Reports feeling well this morning.  Reports pain is okay, and denies pain on inspiration.  Denies fever, chills, chest pain, shortness of breath, nausea vomiting or diarrhea.  Assessment & Plan:   Principal Problem:   Closed fracture of left femur, unspecified fracture morphology, initial encounter [facility]) Active Problems:   Essential hypertension   Parkinson's disease (HCC)   Hip fracture (HCC)   Closed compression fracture of L1 vertebra (HCC)   Left rib fracture   Femur fracture (HCC)   Acute lower UTI   Left femur fracture - s/p surgical repair 10/22 with Dr.  Jena Gauss Fracture of Left Ribs 8 and 9 L1 Compression Fracture ED discussed plan with patient's cousin Ms. Juliann Pulse.  Above injuries secondary to mechanical fall at home on 10/9.    PT evaluated patient today and recommends SNF on discharge with 24-hour supervision/assistance. - incentive spirometry  - ortho following - pain management - bowel regimen - PT/OT - Case management working on discharge planning  Urinary Tract Infection - unclear if patient symptomatic at this time.  Urine culture growing Staph epidermidis which could be contaminant or colonization.  Sensitivities pending.  Reviewed prior urine cultures.  2 months ago grew multiple bacteria.  Since patient asymptomatic, afebrile, without leukocytosis, will hold further antibiotics and monitor. - f/u urine culture -Stop Rocephin   Possible Atrial Fibrillation versus sinus arrhythmia - unknown if prior diagnosis but pulse irregular on exam postoperatively.  EKG in the ER did show sinus arrhythmia with poor R wave progression, QTC 421. - monitor on telemetry  History of Parkinson's - continue on Sinemet.  Advanced dementia - stable, unknown if behavioral issues  Normocytic normochromic anemia  - monitor CBC's - expect some decrease following surgery  BPH - continue home Flomax  History of Hypertension - not on medications at home - monitor BP and will treat as needed if elevated    DVT prophylaxis: Lovenox Code Status:   Code Status: Full Code Family Communication: None at bedside Disposition Plan: Case management working on SNF placement.  Medically stable, discharge pending Ortho clearance and SNF bed available.  Consultants:   Orthopedics  Procedures:  10/22: Cephalomedullary nailing of left intertrochanteric femur fracture  Antimicrobials:  Rocephin, start 10/21, stop 10/23   Objective: Vitals:   08/18/19 2100 08/18/19 2352 08/19/19 0432 08/19/19 0827  BP: (!) 149/96 105/77 (!) 150/91 140/82   Pulse: 84 72 75 68  Resp: 16 16 16 16   Temp: (!) 97.5 F (36.4 C) (!) 97.4 F (36.3 C) (!) 97.4 F (36.3 C) 98.2 F (36.8 C)  TempSrc: Oral Oral Oral Oral  SpO2: 96% 96% 96% 96%    Intake/Output Summary (Last 24 hours) at 08/19/2019 0929 Last data filed at 08/18/2019 2321 Gross per 24 hour  Intake 1750 ml  Output 100 ml  Net 1650 ml   There were no vitals filed for this visit.  Examination:  General exam: awake, alert, no acute distress, frail, underweight, pleasant HEENT: atraumatic, clear conjunctiva, dry mucus membranes, hearing grossly normal  Respiratory system: clear to auscultation bilaterally, no wheezes, rales or rhonchi, normal respiratory effort. Cardiovascular system: normal S1/S2, irregular rhythm, regular rate, no JVD, murmurs, rubs, gallops, no pedal edema.   Gastrointestinal system: soft, non-tender, non-distended abdomen Central nervous system: no gross focal neurologic deficits, normal speech Extremities: moves all, no edema, normal tone Skin: dry, intact, normal temperature Psychiatry: normal mood, congruent affect, cognitive deficit   Data Reviewed: I have personally reviewed following labs and imaging studies  CBC: Recent Labs  Lab 08/17/19 1602 08/19/19 0255  WBC 7.6 7.7  NEUTROABS 5.5  --   HGB 12.5* 11.4*  HCT 38.1* 34.2*  MCV 97.7 96.3  PLT 276 292   Basic Metabolic Panel: Recent Labs  Lab 08/17/19 1602 08/19/19 0255  NA 137 136  K 3.8 4.3  CL 102 101  CO2 27 25  GLUCOSE 89 114*  BUN 24* 21  CREATININE 0.69 0.63  CALCIUM 8.8* 8.8*   GFR: CrCl cannot be calculated (Unknown ideal weight.). Liver Function Tests: Recent Labs  Lab 08/17/19 1602  AST 21  ALT 12  ALKPHOS 55  BILITOT 1.1  PROT 5.6*  ALBUMIN 3.0*   No results for input(s): LIPASE, AMYLASE in the last 168 hours. No results for input(s): AMMONIA in the last 168 hours. Coagulation Profile: No results for input(s): INR, PROTIME in the last 168 hours. Cardiac  Enzymes: No results for input(s): CKTOTAL, CKMB, CKMBINDEX, TROPONINI in the last 168 hours. BNP (last 3 results) No results for input(s): PROBNP in the last 8760 hours. HbA1C: No results for input(s): HGBA1C in the last 72 hours. CBG: No results for input(s): GLUCAP in the last 168 hours. Lipid Profile: No results for input(s): CHOL, HDL, LDLCALC, TRIG, CHOLHDL, LDLDIRECT in the last 72 hours. Thyroid Function Tests: No results for input(s): TSH, T4TOTAL, FREET4, T3FREE, THYROIDAB in the last 72 hours. Anemia Panel: No results for input(s): VITAMINB12, FOLATE, FERRITIN, TIBC, IRON, RETICCTPCT in the last 72 hours. Sepsis Labs: No results for input(s): PROCALCITON, LATICACIDVEN in the last 168 hours.  Recent Results (from the past 240 hour(s))  SARS CORONAVIRUS 2 (TAT 6-24 HRS) Nasopharyngeal Nasopharyngeal Swab     Status: None   Collection Time: 08/17/19  4:37 PM   Specimen: Nasopharyngeal Swab  Result Value Ref Range Status   SARS Coronavirus 2 NEGATIVE NEGATIVE Final    Comment: (NOTE) SARS-CoV-2 target nucleic acids are NOT DETECTED. The SARS-CoV-2 RNA is generally detectable in upper and lower respiratory specimens during the acute phase of infection. Negative results do not preclude SARS-CoV-2 infection, do not rule out co-infections with other pathogens, and should not be used as the sole basis for treatment or other patient  management decisions. Negative results must be combined with clinical observations, patient history, and epidemiological information. The expected result is Negative. Fact Sheet for Patients: HairSlick.no Fact Sheet for Healthcare Providers: quierodirigir.com This test is not yet approved or cleared by the [place] FDA and  has been authorized for detection and/or diagnosis of SARS-CoV-2 by FDA under an Emergency Use Authorization (EUA). This EUA will remain  in effect (meaning this test can  be used) for the duration of the COVID-19 declaration under Section 56 4(b)(1) of the Act, 21 U.S.C. section 360bbb-3(b)(1), unless the authorization is terminated or revoked sooner. Performed at [facility], [address], [place], [place] [address]   Urine culture     Status: None (Preliminary result)   Collection Time: 08/17/19  7:00 PM   Specimen: Urine, Random  Result Value Ref Range Status   Specimen Description URINE, RANDOM  Final   Special Requests NONE  Final   Culture   Final    CULTURE REINCUBATED FOR BETTER GROWTH Performed at [facility], [address], [place], [place] [address]    Report Status PENDING  Incomplete  MRSA PCR Screening     Status: None   Collection Time: 08/19/19  4:54 AM   Specimen: Nasal Mucosa; Nasopharyngeal  Result Value Ref Range Status   MRSA by PCR NEGATIVE NEGATIVE Final    Comment:        The GeneXpert MRSA Assay (FDA approved for NASAL specimens only), is one component of a comprehensive MRSA colonization surveillance program. It is not intended to diagnose MRSA infection nor to guide or monitor treatment for MRSA infections. Performed at [facility], [address] [place], [place] [address]          Radiology Studies: Dg Chest 1 View  Result Date: 08/17/2019 CLINICAL DATA:  Pain secondary to a fall on 08/05/2019. Proximal left femur fracture. L1 fracture. EXAM: CHEST  1 VIEW COMPARISON:  Chest x-ray dated 06/16/2019 FINDINGS: The heart size and pulmonary vascularity are normal. Aortic atherosclerosis. No infiltrates or effusions. Chronic elevation of the left hemidiaphragm. There appear to be 2 fractures of the lateral aspect of the left eighth and ninth ribs. IMPRESSION: 1. Fractures of the lateral aspects of the left eighth and ninth ribs. 2. Aortic atherosclerosis. 3. Chronic elevation of the left hemidiaphragm. Electronically Signed   By: Francene Boyers M.D.   On: 08/17/2019 19:00   Dg Lumbar Spine  Complete  Result Date: 08/17/2019 CLINICAL DATA:  Left hip pain secondary to a fall on 08/05/2019. EXAM: LUMBAR SPINE - COMPLETE 4+ VIEW COMPARISON:  None. FINDINGS: There is a mild compression fracture of the superior endplate of L1, age indeterminate. The remainder of the lumbar spine appears normal. IMPRESSION: Mild compression fracture of the superior endplate of L1, age indeterminate. Electronically Signed   By: Francene Boyers M.D.   On: 08/17/2019 18:52   Ct Head Wo Contrast  Result Date: 08/17/2019 CLINICAL DATA:  Fall 10 days ago with trauma to the head. EXAM: CT HEAD WITHOUT CONTRAST TECHNIQUE: Contiguous axial images were obtained from the base of the skull through the vertex without intravenous contrast. COMPARISON:  06/16/2019 FINDINGS: Brain: The study suffers from motion artifact. There is chronic ventriculomegaly of the lateral and third ventricles which appears unchanged from the previous studies. 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 No sign of acute infarction, mass lesion, hemorrhage or extra-axial collection. Vascular: There is atherosclerotic calcification of the major vessels at the base of the brain. Skull: Negative Sinuses/Orbits: Clear/normal Other: None IMPRESSION: No acute or traumatic finding by CT. Chronic ventriculomegaly of the lateral and third ventricles. 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 Electronically Signed   By: Paulina Fusi M.D.   On: 08/17/2019 18:43   Dg Shoulder Left  Result Date: 08/17/2019 CLINICAL DATA:  Pain secondary to a fall on 08/05/2019 EXAM: LEFT SHOULDER - 2+ VIEW COMPARISON:  None. FINDINGS: There is no evidence of fracture or  dislocation. There is no evidence of arthropathy or other focal bone abnormality. Soft tissues are unremarkable. IMPRESSION: Negative. Electronically Signed   By: Francene Boyers M.D.   On: 08/17/2019 18:54   Dg Knee Complete 4 Views Left  Result Date: 08/17/2019 CLINICAL DATA:  Pain secondary to a fall on 08/05/2019 EXAM: LEFT KNEE - COMPLETE 4+ VIEW COMPARISON:  None. FINDINGS: No evidence of fracture, dislocation, or joint effusion. No evidence of arthropathy or other focal bone abnormality. Soft tissues are unremarkable. IMPRESSION: Negative. Electronically Signed   By: Francene Boyers M.D.   On: 08/17/2019 18:53   Dg C-arm 1-60 Min  Result Date: 08/18/2019 CLINICAL DATA:  ORIF left hip EXAM: OPERATIVE LEFT HIP (WITH PELVIS IF PERFORMED) 4 VIEWS TECHNIQUE: Fluoroscopic spot image(s) were submitted for interpretation post-operatively. FINDINGS: ORIF left hip fracture. Hardware intact. Anatomic alignment. IMPRESSION: ORIF left hip fracture.  Hardware intact.  Anatomic alignment. Electronically Signed   By: Maisie Fus  Register   On: 08/18/2019 16:44   Dg Hip Operative Unilat W Or W/o Pelvis Left  Result Date: 08/18/2019 CLINICAL DATA:  ORIF left hip EXAM: OPERATIVE LEFT HIP (WITH PELVIS IF PERFORMED) 4 VIEWS TECHNIQUE: Fluoroscopic spot image(s) were submitted for interpretation post-operatively. FINDINGS: ORIF left hip fracture. Hardware intact. Anatomic alignment. IMPRESSION: ORIF left hip fracture.  Hardware intact.  Anatomic alignment. Electronically Signed   By: Maisie Fus  Register   On: 08/18/2019 16:44   Dg Hip Unilat W Or W/o Pelvis 2-3 Views Left  Result Date: 08/18/2019 CLINICAL DATA:  Fracture EXAM: DG HIP (WITH OR WITHOUT PELVIS) 2-3V LEFT COMPARISON:  08/18/2019 and 08/17/2019 FINDINGS: Status post intramedullary rod and screw fixation of comminuted left intertrochanteric fracture. Gas in the soft tissues consistent with recent surgery. Femoral head alignment is normal. Phleboliths in the  pelvis. IMPRESSION: Interval surgical fixation of left intertrochanteric fracture with expected postsurgical change Electronically Signed   By: Jasmine Pang M.D.   On: 08/18/2019 18:18   Dg Hip Unilat W Or Wo Pelvis 2-3 Views Left  Result Date: 08/17/2019 CLINICAL DATA:  Left hip pain since a fall dated 08/05/2019. EXAM: DG HIP (WITH OR WITHOUT PELVIS) 2-3V LEFT COMPARISON:  CT scan of the left hip dated 06/17/2019 FINDINGS: There is an angulated fracture of the proximal left femur involving the greater trochanter and extending into the intertrochanteric region. The fracture only extends into proximal aspect of the lesser trochanter. Pelvic bones are normal. IMPRESSION: Angulated fracture of the proximal left femur as described. Electronically Signed   By: Francene Boyers M.D.   On: 08/17/2019 18:49        Scheduled Meds:  acetaminophen  1,000 mg Oral Q6H   carbidopa-levodopa  1 tablet Oral TID  enoxaparin (LOVENOX) injection  40 mg Subcutaneous Q24H   escitalopram  5 mg Oral Daily   famotidine  20 mg Oral BID   senna  8.6 mg Oral Daily   tamsulosin  0.4 mg Oral QHS   Continuous Infusions:   ceFAZolin (ANCEF) IV 2 g (08/19/19 0500)   cefTRIAXone (ROCEPHIN)  IV 1 g (08/18/19 2127)   lactated ringers 10 mL/hr at 08/18/19 1158     LOS: 2 days    Time spent: 30 to 35 minutes    Ezekiel Slocumb, DO [facility] Pager: [phone]  If 7PM-7AM, please contact night-coverage [email or web address] Password [email or web address] 08/19/2019, 9:29 AM

## 2019-08-19 NOTE — Care Management (Signed)
CM consult acknowledged to assist with any HH needs. Awaiting PT/OT eval for DCP recommendations and will continue to follow.  Midge Minium RN, BSN, NCM-BC, ACM-RN [phone]

## 2019-08-19 NOTE — Evaluation (Signed)
Physical Therapy Evaluation Patient Details Name: Kevin Gould MRN: [id number] DOB: Jan 12, 1946 Today's Date: 08/19/2019   History of Present Illness  Kevin Gould is a 73 y.o. male with history of advanced dementia, Parkinson's disease, BPH, hypertension was brought into the ER after patient was found with increasing pain in the left hip.  As per the report, patient had a mechanical fall on August 05, 2019 and has since had increasing pain recently as well as some nausea. Patient has advanced dementia and does not contribute much to the history.  In the ED, patient was not in distress, was able to follow commands.  CT of the head did not show any acute findings, x-rays done showed left proximal femur fracture and left-sided rib fracture involving the eighth ninth and also L1 compression fracture per on-call orthopedic surgeon Dr. Jena Gauss, who has been consulted and recommended admission for further management including surgery..  Pt undersent left IM nail on 10/22.    Clinical Impression  Pt admitted with above diagnosis. Pt total assist to transfer to the chair squat pivot with use of pad. Pt confused and needed incr cues and assist for safety.   Pt currently with functional limitations due to the deficits listed below (see PT Problem List). Pt will benefit from skilled PT to increase their independence and safety with mobility to allow discharge to the venue listed below.      Follow Up Recommendations SNF;Supervision/Assistance - 24 hour    Equipment Recommendations  Other (comment)(TBA)    Recommendations for Other Services       Precautions / Restrictions Precautions Precautions: Fall Restrictions Weight Bearing Restrictions: Yes LLE Weight Bearing: Weight bearing as tolerated      Mobility  Bed Mobility Overal bed mobility: Needs Assistance Bed Mobility: Supine to Sit     Supine to sit: Total assist;HOB elevated     General bed mobility comments: Pt did not help  with movement to EOB.    Transfers Overall transfer level: Needs assistance Equipment used: Rolling walker (2 wheeled);2 person hand held assist Transfers: Sit to/from [employer] Sit to Stand: From elevated surface;Total assist;Max assist   Squat pivot transfers: Total assist     General transfer comment: Pt unable to stand to RW as he would not widen BOs and he would not get his right foot on the floor for standing to RW.  Moved RW and ended up using pad and gait belt and performed max-total assist squat pivot transfer with lifting assist with pad under buttocks.    Ambulation/Gait                Stairs            Wheelchair Mobility    Modified Rankin (Stroke Patients Only)       Balance                                             Pertinent Vitals/Pain Pain Assessment: Faces Faces Pain Scale: Hurts little more Pain Location: left LE Pain Descriptors / Indicators: Grimacing;Guarding;Operative site guarding Pain Intervention(s): Limited activity within patient's tolerance;Monitored during session;Repositioned    Home Living Family/patient expects to be discharged to:: Skilled nursing facility                 Additional Comments: Pt was at SNF PTA.     Prior  Function           Comments: Pt with dementia - unable to get prior functional level.      Hand Dominance   Dominant Hand: Right    Extremity/Trunk Assessment   Upper Extremity Assessment Upper Extremity Assessment: Defer to OT evaluation    Lower Extremity Assessment Lower Extremity Assessment: LLE deficits/detail LLE Deficits / Details: grossly 2-/5 LLE: Unable to fully assess due to pain    Cervical / Trunk Assessment Cervical / Trunk Assessment: Kyphotic  Communication   Communication: HOH  Cognition Arousal/Alertness: Awake/alert Behavior During Therapy: Anxious Overall Cognitive Status: History of cognitive impairments - at  baseline                                 General Comments: dementa      General Comments      Exercises General Exercises - Lower Extremity Ankle Circles/Pumps: AROM;Both;10 reps;Supine Quad Sets: AROM;Both;10 reps;Supine Long Arc Quad: AAROM;Both;10 reps;Seated   Assessment/Plan    PT Assessment Patient needs continued PT services  PT Problem List Decreased activity tolerance;Decreased balance;Decreased mobility;Decreased knowledge of use of DME;Decreased safety awareness;Decreased knowledge of precautions;Pain;Decreased strength       PT Treatment Interventions DME instruction;Functional mobility training;Therapeutic activities;Therapeutic exercise;Balance training;Patient/family education;Gait training    PT Goals (Current goals can be found in the Care Plan section)  Acute Rehab PT Goals Patient Stated Goal: unable to state PT Goal Formulation: Patient unable to participate in goal setting Time For Goal Achievement: 09/02/19 Potential to Achieve Goals: Fair    Frequency Min 2X/week   Barriers to discharge Decreased caregiver support      Co-evaluation               AM-PAC PT "6 Clicks" Mobility  Outcome Measure Help needed turning from your back to your side while in a flat bed without using bedrails?: Total Help needed moving from lying on your back to sitting on the side of a flat bed without using bedrails?: Total Help needed moving to and from a bed to a chair (including a wheelchair)?: Total Help needed standing up from a chair using your arms (e.g., wheelchair or bedside chair)?: Total Help needed to walk in hospital room?: Total Help needed climbing 3-5 steps with a railing? : Total 6 Click Score: 6    End of Session Equipment Utilized During Treatment: Gait belt Activity Tolerance: Patient limited by fatigue;Patient limited by pain Patient left: in chair;with call bell/phone within reach;with chair alarm set Nurse Communication:  Mobility status PT Visit Diagnosis: Unsteadiness on feet (R26.81);Pain Pain - Right/Left: Left Pain - part of body: Leg    Time: 9024-0973 PT Time Calculation (min) (ACUTE ONLY): 20 min   Charges:   PT Evaluation $PT Eval Moderate Complexity: [place] Pager:  [phone]  Office:  [phone]    Denice Paradise 08/19/2019, 2:01 PM

## 2019-08-19 NOTE — Progress Notes (Signed)
Orthopaedic Trauma Progress Note  S: Doing well. States he is having minimal pain. Thankful for the surgery.  O:  Vitals:   08/19/19 0827 08/19/19 1318  BP: 140/82 94/71  Pulse: 68 94  Resp: 16 18  Temp: 98.2 F (36.8 C) 98.2 F (36.8 C)  SpO2: 96% 93%   Gen: NAD, awake and alert and responds appropriately to questions and seems to understand what is occurring  LLE: Dressings clean and dry. Neurovascularly intact  Imaging: Stable post op imaging  Labs:  Results for orders placed or performed during the hospital encounter of 08/17/19 (from the past 24 hour(s))  VITAMIN D 25 Hydroxy (Vit-D Deficiency, Fractures)     Status: Abnormal   Collection Time: 08/19/19  2:55 AM  Result Value Ref Range   Vit D, 25-Hydroxy 19.38 (L) 30 - 100 ng/mL  CBC     Status: Abnormal   Collection Time: 08/19/19  2:55 AM  Result Value Ref Range   WBC 7.7 4.0 - 10.5 K/uL   RBC 3.55 (L) 4.22 - 5.81 MIL/uL   Hemoglobin 11.4 (L) 13.0 - 17.0 g/dL   HCT 34.2 (L) 39.0 - 52.0 %   MCV 96.3 80.0 - 100.0 fL   MCH 32.1 26.0 - 34.0 pg   MCHC 33.3 30.0 - 36.0 g/dL   RDW 13.1 11.5 - 15.5 %   Platelets 292 150 - 400 K/uL   nRBC 0.0 0.0 - 0.2 %  Basic metabolic panel     Status: Abnormal   Collection Time: 08/19/19  2:55 AM  Result Value Ref Range   Sodium 136 135 - 145 mmol/L   Potassium 4.3 3.5 - 5.1 mmol/L   Chloride 101 98 - 111 mmol/L   CO2 25 22 - 32 mmol/L   Glucose, Bld 114 (H) 70 - 99 mg/dL   BUN 21 8 - 23 mg/dL   Creatinine, Ser 0.63 0.61 - 1.24 mg/dL   Calcium 8.8 (L) 8.9 - 10.3 mg/dL   GFR calc non Af Amer >60 >60 mL/min   GFR calc Af Amer >60 >60 mL/min   Anion gap 10 5 - 15  MRSA PCR Screening     Status: None   Collection Time: 08/19/19  4:54 AM   Specimen: Nasal Mucosa; Nasopharyngeal  Result Value Ref Range   MRSA by PCR NEGATIVE NEGATIVE    Assessment: 73 yo s/p fall  Injuries: L intertrochanteric femur fracture s/p cephalomedullary nailing  Weightbearing: WBAT  LLE  Insicional and dressing care: Dressings clean, dry and intact until POD 2 vs 3  Orthopedic device(s):None  CV/Blood loss:ABLA, stable this AM  Pain management: 1. Tylenol 1000mg  q6 hours 2. Tramadol q 6 hours PRN 3. Morphine 0.5-1mg  q2 hours PRN  VTE prophylaxis: Lovenox  ID: [id number] for surgical prophylaxis  Medical co-morbidities: Vit D deficiency-will start vit D3 supplementation  Dispo: Likely SNF  Follow - up plan: Follow up 2-3 weeks for x-rays and wound check  Shona Needles, MD [facility] [phone] (office) [email or web address]

## 2019-08-19 NOTE — Evaluation (Signed)
Occupational Therapy Evaluation Patient Details Name: Kevin Gould MRN: [id number] DOB: 23-Oct-1946 Today's Date: 08/19/2019    History of Present Illness Kevin Gould is a 73 y.o. male with history of advanced dementia, Parkinson's disease, BPH, hypertension was brought into the ER after patient was found with increasing pain in the left hip.  As per the report, patient had a mechanical fall on August 05, 2019 and has since had increasing pain recently as well as some nausea. Patient has advanced dementia and does not contribute much to the history.  In the ED, patient was not in distress, was able to follow commands.  CT of the head did not show any acute findings, x-rays done showed left proximal femur fracture and left-sided rib fracture involving the eighth ninth and also L1 compression fracture per on-call orthopedic surgeon Dr. Jena Gauss, who has been consulted and recommended admission for further management including surgery..  Pt undersent left IM nail on 10/22.     Clinical Impression   Pt with decline in function and safety with ADLs and ADL mobility with impaired strength, balance, endurance and cognition (hx of dementia). Pt is a poor historian and unable to provide an accurate PLOF besides that he came from a SNF and has been living there. Pt currently requires min A with UB ADLs, max - total A with LB ADLs, total A with toileting and was unable to SPT to [facility] from RW after standing from recliner max A (3 attempts). Pt very pleasant and cooperative; required multiple cues to complete tasks and was unable to understand directions to order hi lunch ad dinner (OT assisted pt to order meals at end of session). Pt would benefit from acute OT services to address impairments to maximize level of function and safety    Follow Up Recommendations  SNF;Supervision/Assistance - 24 hour    Equipment Recommendations  None recommended by OT    Recommendations for Other Services        Precautions / Restrictions Precautions Precautions: Fall Restrictions Weight Bearing Restrictions: Yes LLE Weight Bearing: Weight bearing as tolerated      Mobility Bed Mobility Overal bed mobility: Needs Assistance Bed Mobility: Supine to Sit     Supine to sit: Total assist;HOB elevated     General bed mobility comments: pt in recliner upon arrival  Transfers Overall transfer level: Needs assistance Equipment used: Rolling walker (2 wheeled);2 person hand held assist Transfers: Sit to/from Stand Sit to Stand: Total assist;Max assist        General transfer comment: attempted x 3 sit - stand from recliner, unable to complete SPT to [facility]    Balance Overall balance assessment: Needs assistance Sitting-balance support: Feet supported Sitting balance-Leahy Scale: Fair       Standing balance-Leahy Scale: Zero                             ADL either performed or assessed with clinical judgement   ADL Overall ADL's : Needs assistance/impaired Eating/Feeding: Independent;Sitting   Grooming: Wash/dry hands;Wash/dry face;Min guard;Sitting   Upper Body Bathing: Minimal assistance;Sitting   Lower Body Bathing: Maximal assistance;Sitting/lateral leans   Upper Body Dressing : Minimal assistance;Sitting   Lower Body Dressing: Total assistance     Toilet Transfer Details (indicate cue type and reason): sit - stand max - total A, unable to complete SPT to [facility] Toileting- Clothing Manipulation and Hygiene: Total assistance  Vision Patient Visual Report: No change from baseline       Perception     Praxis      Pertinent Vitals/Pain Pain Assessment: Faces Faces Pain Scale: Hurts little more Pain Location: left LE Pain Descriptors / Indicators: Grimacing;Guarding;Operative site guarding Pain Intervention(s): Limited activity within patient's tolerance;Monitored during session;Repositioned     Hand Dominance Right   Extremity/Trunk  Assessment Upper Extremity Assessment Upper Extremity Assessment: Generalized weakness   Lower Extremity Assessment Lower Extremity Assessment: Defer to PT evaluation LLE Deficits / Details: grossly 2-/5 LLE: Unable to fully assess due to pain   Cervical / Trunk Assessment Cervical / Trunk Assessment: Kyphotic   Communication Communication Communication: HOH   Cognition Arousal/Alertness: Awake/alert Behavior During Therapy: Anxious Overall Cognitive Status: History of cognitive impairments - at baseline                                 General Comments: hx of dementia   General Comments       Exercises Exercises: General Lower Extremity General Exercises - Lower Extremity Ankle Circles/Pumps: AROM;Both;10 reps;Supine Quad Sets: AROM;Both;10 reps;Supine Long Arc Quad: AAROM;Both;10 reps;Seated   Shoulder Instructions      Home Living Family/patient expects to be discharged to:: Skilled nursing facility                                 Additional Comments: Pt was at SNF PTA.       Prior Functioning/Environment Level of Independence: Independent with assistive device(s)        Comments: Pt with dementia - unable to get prior functional level. Pt reoports that he has been at SNF x 4 months and has help with selfcare but unable to quantify        OT Problem List: Decreased strength;Impaired balance (sitting and/or standing);Decreased cognition;Decreased knowledge of precautions;Pain;Decreased safety awareness;Decreased activity tolerance;Decreased knowledge of use of DME or AE      OT Treatment/Interventions: Self-care/ADL training;DME and/or AE instruction;Therapeutic activities;Patient/family education    OT Goals(Current goals can be found in the care plan section) Acute Rehab OT Goals Patient Stated Goal: get better OT Goal Formulation: With patient Time For Goal Achievement: 09/02/19 ADL Goals Pt Will Perform Grooming: with  supervision;with set-up;sitting Pt Will Perform Upper Body Bathing: with min guard assist;sitting Pt Will Perform Lower Body Bathing: with mod assist;sitting/lateral leans Pt Will Perform Upper Body Dressing: with min guard assist;sitting Pt Will Transfer to Toilet: with max assist;stand pivot transfer;squat pivot transfer;bedside commode  OT Frequency: Min 2X/week   Barriers to D/C: Decreased caregiver support          Co-evaluation              AM-PAC OT "6 Clicks" Daily Activity     Outcome Measure Help from another person eating meals?: None Help from another person taking care of personal grooming?: A Little Help from another person toileting, which includes using toliet, bedpan, or urinal?: Total Help from another person bathing (including washing, rinsing, drying)?: Total Help from another person to put on and taking off regular upper body clothing?: A Little Help from another person to put on and taking off regular lower body clothing?: Total 6 Click Score: 13   End of Session Equipment Utilized During Treatment: Gait belt;Rolling walker  Activity Tolerance: Patient limited by fatigue;Patient limited by pain Patient left: in  chair;with call bell/phone within reach;with chair alarm set  OT Visit Diagnosis: Unsteadiness on feet (R26.81);Other abnormalities of gait and mobility (R26.89);Muscle weakness (generalized) (M62.81);History of falling (Z91.81);Other symptoms and signs involving cognitive function;Pain Pain - Right/Left: Left Pain - part of body: Hip;Leg                Time: 3244-0102 OT Time Calculation (min): 22 min Charges:  OT General Charges $OT Visit: 1 Visit OT Evaluation $OT Eval Moderate Complexity: 1 Mod    Britt Bottom 08/19/2019, 2:18 PM

## 2019-08-20 ENCOUNTER — Encounter (HOSPITAL_COMMUNITY): Payer: Self-pay | Admitting: Orthopedic Surgery

## 2019-08-20 DIAGNOSIS — E559 Vitamin D deficiency, unspecified: Secondary | ICD-10-CM

## 2019-08-20 HISTORY — DX: Vitamin D deficiency, unspecified: E55.9

## 2019-08-20 LAB — CBC WITH DIFFERENTIAL/PLATELET
Abs Immature Granulocytes: 0.03 10*3/uL (ref 0.00–0.07)
Basophils Absolute: 0 10*3/uL (ref 0.0–0.1)
Basophils Relative: 1 %
Eosinophils Absolute: 0.1 10*3/uL (ref 0.0–0.5)
Eosinophils Relative: 1 %
HCT: 32 % — ABNORMAL LOW (ref 39.0–52.0)
Hemoglobin: 10.5 g/dL — ABNORMAL LOW (ref 13.0–17.0)
Immature Granulocytes: 1 %
Lymphocytes Relative: 21 %
Lymphs Abs: 1.4 10*3/uL (ref 0.7–4.0)
MCH: 31.6 pg (ref 26.0–34.0)
MCHC: 32.8 g/dL (ref 30.0–36.0)
MCV: 96.4 fL (ref 80.0–100.0)
Monocytes Absolute: 0.5 10*3/uL (ref 0.1–1.0)
Monocytes Relative: 8 %
Neutro Abs: 4.4 10*3/uL (ref 1.7–7.7)
Neutrophils Relative %: 68 %
Platelets: 299 10*3/uL (ref 150–400)
RBC: 3.32 MIL/uL — ABNORMAL LOW (ref 4.22–5.81)
RDW: 13.2 % (ref 11.5–15.5)
WBC: 6.5 10*3/uL (ref 4.0–10.5)
nRBC: 0 % (ref 0.0–0.2)

## 2019-08-20 LAB — URINE CULTURE: Culture: >=100000 — AB

## 2019-08-20 MED ORDER — VITAMIN D 25 MCG (1000 UNIT) PO TABS
2000.0000 [IU] | ORAL_TABLET | Freq: Three times a day (TID) | ORAL | Status: DC
  Administered 2019-08-20 – 2019-08-22 (×7): 2000 [IU] via ORAL
  Filled 2019-08-20 (×7): qty 2

## 2019-08-20 MED ORDER — POLYETHYLENE GLYCOL 3350 17 G PO PACK: 17.0000 g | PACK | Freq: Every day | ORAL | Status: DC | PRN

## 2019-08-20 MED ORDER — VITAMIN C 500 MG PO TABS
500.0000 mg | ORAL_TABLET | Freq: Every day | ORAL | Status: DC
  Administered 2019-08-20 – 2019-08-22 (×3): 500 mg via ORAL
  Filled 2019-08-20 (×3): qty 1

## 2019-08-20 NOTE — Progress Notes (Addendum)
PROGRESS NOTE    Kevin Gould  WUJ:[id number] DOB: 09/08/1946 DOA: 08/17/2019  PCP: [name], [name], MD    LOS - 3   Brief Narrative:  Kevin Reeksobert Kevin Cummingsis a 73 y.o.malewithhistory of advanced dementia, Parkinson's disease, BPH, hypertension was brought into the ER after patient was foundwithincreasing pain in the left hip. As per the report,patient had a mechanicalfall on October 9, 2020and has since hadincreasing pain recentlyas well assome nausea. Patient has advanced dementia and does not contribute much to the history.In the ED,patientwas not in distress, was able tofollow commands. CT of the head did not show any acutefindings,x-rays done showed left proximal femur fracture and left-sided rib fracture involving the eighth ninth and also L1 compression fracture per on-call orthopedic surgeon Dr. Jena GaussHaddix, whohas been consulted and recommended admissionfor further management including surgery.UA concerning forUTI for which patient was started on ceftriaxone. Patient underwent surgery afternoon of 10/22.  Subjective @TODAY @: Patient seen sitting up in bed eating breakfast.  Reports controlled pain.  Denies fevers or chills, dysuria, abdominal pain, nausea vomiting or diarrhea, chest pain or shortness of breath.  Reports incontinence of bowel and bladder, and numerous BMs here.    Assessment & Plan:   Principal Problem:   Closed fracture of left femur, unspecified fracture morphology, initial encounter [facility](HCC) Active Problems:   Essential hypertension   Parkinson's disease (HCC)   Hip fracture (HCC)   Closed compression fracture of L1 vertebra (HCC)   Left rib fracture   Femur fracture (HCC)   Acute lower UTI   Protein-calorie malnutrition, severe   Left femur fracture- s/p surgical repair 10/22 with Dr. Jena GaussHaddix Fracture of Left Ribs 8 and 9 L1 Compression Fracture ED discussed plan withpatient's cousin Ms. Juliann PulsePalma Lasher. Above injuries secondary to  mechanical fall at home on 10/9.   PT evaluated patient today and recommends SNF on discharge with 24-hour supervision/assistance. - incentive spirometry  - ortho following - pain management - bowel regimen - PT/OT - Case management working on discharge planning  Possible Urinary Tract Infection- unclear if patient symptomatic at this time.  Urine culture growing Staph epidermidis which could be contaminant or colonization. Sensitive to Cipro, gentamicin, vanco, rifampin.  Reviewed prior urine cultures.  2 months ago grew multiple bacteria.  Since patient asymptomatic, afebrile, without leukocytosis, will hold further antibiotics and monitor.  Would start Cipro if symptomatic and no contraindications.  Frequent Stools - with bowel incontinence. - added hold parameter on Senna if loose/diarrhea - miralax PRN for gentle agent, nonstimulant   Possible Atrial Fibrillation versus sinus arrhythmia- unknown if prior diagnosis but pulse irregular on exam postoperatively.  EKG in the ER did show sinus arrhythmia with poor R wave progression, QTC 421. - monitor on telemetry  History of Parkinson's- continueon Sinemet.  Advanced dementia- stable, unknown if behavioral issues  Normocytic normochromic anemia - monitor CBC's - expect some decrease following surgery  BPH- continue home Flomax  History of Hypertension- not on medications at home - monitor BP and will treat as needed if elevated   DVT prophylaxis: Lovenox Code Status:   Code Status: Full Code Family Communication: None at bedside Disposition Plan: pending SNF bed.  Medically stable for discharge.   Consultants:   Orthopedics  Procedures:  10/22:Cephalomedullary nailing of left intertrochanteric femur fracture  Antimicrobials:   Rocephin, start 10/21, stop 10/23   Objective: Vitals:   08/19/19 2029 08/20/19 0327 08/20/19 0906 08/20/19 1248  BP: 114/81 112/77 111/79 114/82  Pulse: 74 62 82 89  Resp:  Temp: 98.7 F (37.1 C) 98.3 F (36.8 C) 98.3 F (36.8 C) 97.9 F (36.6 C)  TempSrc: Oral Oral Oral Oral  SpO2: 97% 98% 96% 96%  Weight:      Height:        Intake/Output Summary (Last 24 hours) at 08/20/2019 1635 Last data filed at 08/20/2019 1400 Gross per 24 hour  Intake 1040 ml  Output 1750 ml  Net -710 ml   Filed Weights   08/19/19 1246  Weight: 57.7 kg    Examination:  General exam: awake, alert, no acute distress, disheveled, underweight Respiratory system: clear to auscultation bilaterally, no wheezes, rales or rhonchi, normal respiratory effort. Cardiovascular system: normal S1/S2, RRR, no JVD, murmurs, rubs, gallops, no pedal edema.   Chest: no tenderness on rib palpation bilaterally Gastrointestinal system: soft, non-tender, non-distended abdomen Central nervous system: no gross focal neurologic deficits, normal speech Extremities: no edema, normal tone, left lower extremity with pillow under knee, dressing over surgical site intact Skin: dry, intact, normal temperature Psychiatry: normal mood, congruent affect   Data Reviewed: I have personally reviewed following labs and imaging studies  CBC: Recent Labs  Lab 08/17/19 1602 08/19/19 0255 08/20/19 0349  WBC 7.6 7.7 6.5  NEUTROABS 5.5  --  4.4  HGB 12.5* 11.4* 10.5*  HCT 38.1* 34.2* 32.0*  MCV 97.7 96.3 96.4  PLT 276 292 299   Basic Metabolic Panel: Recent Labs  Lab 08/17/19 1602 08/19/19 0255  NA 137 136  K 3.8 4.3  CL 102 101  CO2 27 25  GLUCOSE 89 114*  BUN 24* 21  CREATININE 0.69 0.63  CALCIUM 8.8* 8.8*   GFR: Estimated Creatinine Clearance: 69.1 mL/min (by C-G formula based on SCr of 0.63 mg/dL). Liver Function Tests: Recent Labs  Lab 08/17/19 1602  AST 21  ALT 12  ALKPHOS 55  BILITOT 1.1  PROT 5.6*  ALBUMIN 3.0*   No results for input(s): LIPASE, AMYLASE in the last 168 hours. No results for input(s): AMMONIA in the last 168 hours. Coagulation Profile:  No results for input(s): INR, PROTIME in the last 168 hours. Cardiac Enzymes: No results for input(s): CKTOTAL, CKMB, CKMBINDEX, TROPONINI in the last 168 hours. BNP (last 3 results) No results for input(s): PROBNP in the last 8760 hours. HbA1C: No results for input(s): HGBA1C in the last 72 hours. CBG: No results for input(s): GLUCAP in the last 168 hours. Lipid Profile: No results for input(s): CHOL, HDL, LDLCALC, TRIG, CHOLHDL, LDLDIRECT in the last 72 hours. Thyroid Function Tests: No results for input(s): TSH, T4TOTAL, FREET4, T3FREE, THYROIDAB in the last 72 hours. Anemia Panel: No results for input(s): VITAMINB12, FOLATE, FERRITIN, TIBC, IRON, RETICCTPCT in the last 72 hours. Sepsis Labs: No results for input(s): PROCALCITON, LATICACIDVEN in the last 168 hours.  Recent Results (from the past 240 hour(s))  SARS CORONAVIRUS 2 (TAT 6-24 HRS) Nasopharyngeal Nasopharyngeal Swab     Status: None   Collection Time: 08/17/19  4:37 PM   Specimen: Nasopharyngeal Swab  Result Value Ref Range Status   SARS Coronavirus 2 NEGATIVE NEGATIVE Final    Comment: (NOTE) SARS-CoV-2 target nucleic acids are NOT DETECTED. The SARS-CoV-2 RNA is generally detectable in upper and lower respiratory specimens during the acute phase of infection. Negative results do not preclude SARS-CoV-2 infection, do not rule out co-infections with other pathogens, and should not be used as the sole basis for treatment or other patient management decisions. Negative results must be combined  with clinical observations, patient history, and epidemiological information. The expected result is Negative. Fact Sheet for Patients: HairSlick.no Fact Sheet for Healthcare Providers: quierodirigir.com This test is not yet approved or cleared by the [place] FDA and  has been authorized for detection and/or diagnosis of SARS-CoV-2 by FDA under an Emergency Use  Authorization (EUA). This EUA will remain  in effect (meaning this test can be used) for the duration of the COVID-19 declaration under Section 56 4(b)(1) of the Act, 21 U.S.C. section 360bbb-3(b)(1), unless the authorization is terminated or revoked sooner. Performed at [facility], [address] [place], [place] [address]   Urine culture     Status: Abnormal   Collection Time: 08/17/19  7:00 PM   Specimen: Urine, Random  Result Value Ref Range Status   Specimen Description URINE, RANDOM  Final   Special Requests   Final    NONE Performed at [facility], [address], [place], [place] [address]    Culture >=100,000 COLONIES/mL STAPHYLOCOCCUS EPIDERMIDIS (A)  Final   Report Status 08/20/2019 FINAL  Final   Organism ID, Bacteria STAPHYLOCOCCUS EPIDERMIDIS (A)  Final      Susceptibility   Staphylococcus epidermidis - MIC*    CIPROFLOXACIN <=0.5 SENSITIVE Sensitive     GENTAMICIN <=0.5 SENSITIVE Sensitive     NITROFURANTOIN 64 INTERMEDIATE Intermediate     OXACILLIN 1 RESISTANT Resistant     TETRACYCLINE >=16 RESISTANT Resistant     VANCOMYCIN 1 SENSITIVE Sensitive     TRIMETH/SULFA >=320 RESISTANT Resistant     CLINDAMYCIN RESISTANT Resistant     RIFAMPIN <=0.5 SENSITIVE Sensitive     Inducible Clindamycin POSITIVE Resistant     * >=100,000 COLONIES/mL STAPHYLOCOCCUS EPIDERMIDIS  MRSA PCR Screening     Status: None   Collection Time: 08/19/19  4:54 AM   Specimen: Nasal Mucosa; Nasopharyngeal  Result Value Ref Range Status   MRSA by PCR NEGATIVE NEGATIVE Final    Comment:        The GeneXpert MRSA Assay (FDA approved for NASAL specimens only), is one component of a comprehensive MRSA colonization surveillance program. It is not intended to diagnose MRSA infection nor to guide or monitor treatment for MRSA infections. Performed at [facility], [address] [place], [place] [address]          Radiology Studies: Dg C-arm 1-60 Min   Result Date: 08/18/2019 CLINICAL DATA:  ORIF left hip EXAM: OPERATIVE LEFT HIP (WITH PELVIS IF PERFORMED) 4 VIEWS TECHNIQUE: Fluoroscopic spot image(s) were submitted for interpretation post-operatively. FINDINGS: ORIF left hip fracture. Hardware intact. Anatomic alignment. IMPRESSION: ORIF left hip fracture.  Hardware intact.  Anatomic alignment. Electronically Signed   By: Maisie Fus  Register   On: 08/18/2019 16:44   Dg Hip Operative Unilat W Or W/o Pelvis Left  Result Date: 08/18/2019 CLINICAL DATA:  ORIF left hip EXAM: OPERATIVE LEFT HIP (WITH PELVIS IF PERFORMED) 4 VIEWS TECHNIQUE: Fluoroscopic spot image(s) were submitted for interpretation post-operatively. FINDINGS: ORIF left hip fracture. Hardware intact. Anatomic alignment. IMPRESSION: ORIF left hip fracture.  Hardware intact.  Anatomic alignment. Electronically Signed   By: Maisie Fus  Register   On: 08/18/2019 16:44   Dg Hip Unilat W Or W/o Pelvis 2-3 Views Left  Result Date: 08/18/2019 CLINICAL DATA:  Fracture EXAM: DG HIP (WITH OR WITHOUT PELVIS) 2-3V LEFT COMPARISON:  08/18/2019 and 08/17/2019 FINDINGS: Status post intramedullary rod and screw fixation of comminuted left intertrochanteric fracture. Gas in the soft tissues  consistent with recent surgery. Femoral head alignment is normal. Phleboliths in the pelvis. IMPRESSION: Interval surgical fixation of left intertrochanteric fracture with expected postsurgical change Electronically Signed   By: Donavan Foil M.D.   On: 08/18/2019 18:18        Scheduled Meds: . acetaminophen  1,000 mg Oral Q6H  . carbidopa-levodopa  1 tablet Oral TID  . cholecalciferol  2,000 Units Oral TID WC  . enoxaparin (LOVENOX) injection  40 mg Subcutaneous Q24H  . escitalopram  5 mg Oral Daily  . famotidine  20 mg Oral BID  . feeding supplement (ENSURE ENLIVE)  237 mL Oral BID BM  . multivitamin with minerals  1 tablet Oral Daily  . senna  8.6 mg Oral Daily  . tamsulosin  0.4 mg Oral QHS   Continuous  Infusions: . cefTRIAXone (ROCEPHIN)  IV Stopped (08/19/19 2312)  . lactated ringers 10 mL/hr at 08/18/19 1158     LOS: 3 days    Time spent: 25-30 min    Ezekiel Slocumb, DO [facility] Pager: [phone]  If 7PM-7AM, please contact night-coverage [email or web address] Password [facility] 08/20/2019, 4:35 PM

## 2019-08-20 NOTE — Plan of Care (Signed)
  Problem: Activity: Goal: Ability to ambulate and perform ADLs will improve Outcome: Progressing   Problem: Skin Integrity: Goal: Risk for impaired skin integrity will decrease Outcome: Progressing   Problem: Safety: Goal: Ability to remain free from injury will improve Outcome: Progressing   

## 2019-08-20 NOTE — Progress Notes (Addendum)
Orthopaedic Trauma Service Progress Note  Patient ID: Kevin Gould MRN: [id number] DOB/AGE: [birth year] 71 y.o.  Subjective:  Patient sitting in bed naked refusing to put gown back on only wearing his condom catheter otherwise no specific complaints  He seems to be a little confused today  Nursing reports that he was having some hallucinations earlier today  Patient resides at a nursing home.  He states that he walks with a walker but very minimally  ROS As above  Objective:   VITALS:   Vitals:   08/19/19 2029 08/20/19 0327 08/20/19 0906 08/20/19 1248  BP: 114/81 112/77 111/79 114/82  Pulse: 74 62 82 89  Resp:  18 16 16   Temp: 98.7 F (37.1 C) 98.3 F (36.8 C) 98.3 F (36.8 C) 97.9 F (36.6 C)  TempSrc: Oral Oral Oral Oral  SpO2: 97% 98% 96% 96%  Weight:      Height:        Estimated body mass index is 19.34 kg/m as calculated from the following:   Height as of this encounter: 5\' 8"  (1.727 m).   Weight as of this encounter: 57.7 kg.   Intake/Output      10/23 0701 - 10/24 0700 10/24 0701 - 10/25 0700   P.O. 840 480   I.V. (mL/kg)     IV Piggyback 200    Total Intake(mL/kg) 1040 (18) 480 (8.3)   Urine (mL/kg/hr) 1450 (1) 900 (1.5)   Stool 0    Blood     Total Output 1450 900   Net -410 -420        Stool Occurrence 1 x      LABS  Results for orders placed or performed during the hospital encounter of 08/17/19 (from the past 24 hour(s))  CBC with Differential/Platelet     Status: Abnormal   Collection Time: 08/20/19  3:49 AM  Result Value Ref Range   WBC 6.5 4.0 - 10.5 K/uL   RBC 3.32 (L) 4.22 - 5.81 MIL/uL   Hemoglobin 10.5 (L) 13.0 - 17.0 g/dL   HCT 08/19/19 (L) 08/22/19 - 37.6 %   MCV 96.4 80.0 - 100.0 fL   MCH 31.6 26.0 - 34.0 pg   MCHC 32.8 30.0 - 36.0 g/dL   RDW 28.3 15.1 - 76.1 %   Platelets 299 150 - 400 K/uL   nRBC 0.0 0.0 - 0.2 %   Neutrophils Relative % 68 %   Neutro Abs 4.4 1.7 - 7.7 K/uL   Lymphocytes Relative 21 %   Lymphs Abs 1.4 0.7 - 4.0 K/uL   Monocytes Relative 8 %   Monocytes Absolute 0.5 0.1 - 1.0 K/uL   Eosinophils Relative 1 %   Eosinophils Absolute 0.1 0.0 - 0.5 K/uL   Basophils Relative 1 %   Basophils Absolute 0.0 0.0 - 0.1 K/uL   Immature Granulocytes 1 %   Abs Immature Granulocytes 0.03 0.00 - 0.07 K/uL     PHYSICAL EXAM:   Gen: Sitting up in bed, naked, nurse is in room trying to get his gown back on.  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  I can maybe get him to neutral if that  Assessment/Plan: 2 Days Post-Op   Principal Problem:   Closed fracture of left femur, unspecified fracture morphology, initial encounter [facility]) Active Problems:   Essential hypertension   Parkinson's disease ([place])   Hip fracture ([place])   Closed compression fracture of L1 vertebra ([place])   Left rib fracture   Femur fracture ([place])   Acute lower UTI   Protein-calorie malnutrition, severe   Anti-infectives (From admission, onward)   Start     Dose/Rate Route Frequency Ordered Stop   08/18/19 2200  ceFAZolin (ANCEF) IVPB 2g/100 mL premix     2 g 200 mL/hr over 30 Minutes Intravenous Every 8 hours 08/18/19 1825 08/19/19 1350   08/18/19 2000  cefTRIAXone (ROCEPHIN) 1 g in sodium chloride 0.9 % 100 mL IVPB     1 g 200 mL/hr over 30 Minutes Intravenous Every 24 hours 08/18/19 0607     08/18/19 1630  vancomycin (VANCOCIN) powder  Status:  Discontinued       As needed 08/18/19 1615 08/18/19 1709   08/18/19 1147  ceFAZolin (ANCEF) 2-4 GM/100ML-%  IVPB    Note to Pharmacy: Lisette Grinder   : cabinet override      08/18/19 1147 08/18/19 1550   08/18/19 1145  ceFAZolin (ANCEF) IVPB 2g/100 mL premix     2 g 200 mL/hr over 30 Minutes Intravenous On call to O.R. 08/18/19 1144 08/18/19 1550   08/17/19 2015  cefTRIAXone (ROCEPHIN) 1 g in sodium chloride 0.9 % 100 mL IVPB     1 g 200 mL/hr over 30 Minutes Intravenous  Once 08/17/19 2009 08/17/19 2306    .  POD/HD#: 27  73 year old male s/p fall   -Fall  -Left intertrochanteric femur fracture  Weight-bear as tolerated  Dressing changes as needed  Up with assistance  PT OT  Will eventually discharge back to [facility]  - Pain management:  Scheduled Tylenol  Tramadol as needed  - ABL anemia/Hemodynamics  Stable  Monitor   Cbc in am   - Medical issues   Per primary  - DVT/PE prophylaxis:  Lovenox - ID:   Perioperative antibiotic completed - Metabolic Bone Disease:  + Vitamin D deficiency   Supplement  Hip fracture indicative of osteoporosis/poor bone density/quality  Likely nutritional component as well.  Continue with nutritional supplementation  - Activity:  Weight-bear as tolerated with assistance  Up with assistance - FEN/GI prophylaxis/Foley/Lines:  Diet as tolerated  Nutritional supplements  - Impediments to fracture healing:  Poor bone quality  Vitamin D deficiency  Poor nutrition  - Dispo:  Continue with therapies  Follow up with ortho in 7-10 days from discharge from hospital    Jari Pigg, PA-C [phone] (C) 08/20/2019, 5:17 PM  [facility] [place] [address] [phone] Domingo Sep (F)

## 2019-08-20 NOTE — Plan of Care (Signed)
  Problem: Activity: Goal: Ability to ambulate and perform ADLs will improve Outcome: Progressing   Problem: Pain Management: Goal: Pain level will decrease Outcome: Progressing   

## 2019-08-20 NOTE — TOC Initial Note (Signed)
Transition of Care [facility]) - Initial/Assessment Note    Patient Details  Name: Kevin Gould MRN: [id number] Date of Birth: 01/28/1946  Transition of Care [facility]) CM/SW Contact:    Bary Castilla, LCSW Phone Number: [phone] 08/20/2019, 4:41 PM  Clinical Narrative:                  CSW spoke with patient's POA [name] and she stated that she wanted patient to return to [facility]. CSW followed up with Abigail Butts at [facility] and she was aware of patient's plan to discharge back to facility.  CSW will continue to follow for discharge planning.   Expected Discharge Plan: Skilled Nursing Facility Barriers to Discharge: Continued Medical Work up, Ship broker   Patient Goals and CMS Choice        Expected Discharge Plan and Services Expected Discharge Plan: [place]                                              Prior Living Arrangements/Services   Lives with:: Facility Resident Patient language and need for interpreter reviewed:: Yes                 Activities of Daily Living   ADL Screening (condition at time of admission) Patient's cognitive ability adequate to safely complete daily activities?: Yes Is the patient deaf or have difficulty hearing?: No Does the patient have difficulty seeing, even when wearing glasses/contacts?: No Does the patient have difficulty concentrating, remembering, or making decisions?: Yes Patient able to express need for assistance with ADLs?: Yes Does the patient have difficulty dressing or bathing?: Yes Independently performs ADLs?: Yes (appropriate for developmental age) Does the patient have difficulty walking or climbing stairs?: Yes Weakness of Legs: Left Weakness of Arms/Hands: None  Permission Sought/Granted   Permission granted to share information with : Yes, Verbal Permission Granted(Oriented to self spoke with POA)  Share Information with NAME: [name]  Permission granted to share  info w AGENCY: [facility]  Permission granted to share info w Relationship: POA  Permission granted to share info w Contact Information: [phone]  Emotional Assessment   Attitude/Demeanor/Rapport: Unable to Assess Affect (typically observed): Unable to Assess Orientation: : Oriented to Self      Admission diagnosis:  Fall [W19.XXXA] Acute lower UTI [N39.0] Closed fracture of left hip, initial encounter ([place]) [S72.002A] Femur fracture ([place]) [S72.90XA] Patient Active Problem List   Diagnosis Date Noted  . Protein-calorie malnutrition, severe 08/19/2019  . Acute lower UTI   . Hip fracture ([place]) 08/17/2019  . Closed fracture of left femur, unspecified fracture morphology, initial encounter ([place]) 08/17/2019  . Closed compression fracture of L1 vertebra ([place]) 08/17/2019  . Left rib fracture 08/17/2019  . Femur fracture ([place]) 08/17/2019  . Pressure injury of skin 06/19/2019  . Parkinson's disease ([place]) 12/25/2014  . Hydrocephalus, acquired ([place]) 12/25/2014  . Rhabdomyolysis 11/30/2013  . Hypotension 11/30/2013  . Frequent falls 11/30/2013  . Memory problem 11/30/2013  . CERUMEN IMPACTION, BILATERAL 12/11/2010  . Essential hypertension 01/23/2009  . HYPERCHOLESTEROLEMIA 07/25/2008  . NEUROPATHY 07/17/2008  . LOW BACK PAIN, MILD 07/17/2008  . INSOMNIA 07/17/2008  . GERD 08/27/2004  . PARESTHESIA 09/03/1994   PCP:  Antony Contras, MD Pharmacy:  No Pharmacies Listed    Social Determinants of Health (SDOH) Interventions    Readmission Risk Interventions No  flowsheet data found.

## 2019-08-20 NOTE — Plan of Care (Signed)
?  Problem: Pain Management: ?Goal: Pain level will decrease ?Outcome: Progressing ?  ?Problem: Activity: ?Goal: Risk for activity intolerance will decrease ?Outcome: Progressing ?  ?

## 2019-08-21 ENCOUNTER — Encounter (HOSPITAL_COMMUNITY): Payer: Self-pay | Admitting: *Deleted

## 2019-08-21 LAB — CBC
HCT: 34.4 % — ABNORMAL LOW (ref 39.0–52.0)
Hemoglobin: 11.2 g/dL — ABNORMAL LOW (ref 13.0–17.0)
MCH: 31.5 pg (ref 26.0–34.0)
MCHC: 32.6 g/dL (ref 30.0–36.0)
MCV: 96.6 fL (ref 80.0–100.0)
Platelets: 358 10*3/uL (ref 150–400)
RBC: 3.56 MIL/uL — ABNORMAL LOW (ref 4.22–5.81)
RDW: 13 % (ref 11.5–15.5)
WBC: 6.2 10*3/uL (ref 4.0–10.5)
nRBC: 0 % (ref 0.0–0.2)

## 2019-08-21 NOTE — Progress Notes (Signed)
Orthopaedic Trauma Service Progress Note  Patient ID: Kevin Gould MRN: [id number] DOB/AGE: 11-13-1945 73 y.o.  Subjective:  Looking forward to becoming a "wall walker" Patient is in bed watching television resting comfortably No acute events overnight  He is wearing his gown today   Review of Systems  All other systems reviewed and are negative.   Objective:   VITALS:   Vitals:   08/20/19 1248 08/20/19 1942 08/21/19 0334 08/21/19 0745  BP: 114/82 124/86 (!) 141/86 (!) 153/101  Pulse: 89 77 73 74  Resp: 16 18 18 17   Temp: 97.9 F (36.6 C) 98 F (36.7 C) 98.3 F (36.8 C) 98.3 F (36.8 C)  TempSrc: Oral Axillary Axillary Oral  SpO2: 96% 97% 100% 99%  Weight:      Height:        Estimated body mass index is 19.34 kg/m as calculated from the following:   Height as of this encounter: 5\' 8"  (1.727 m).   Weight as of this encounter: 57.7 kg.   Intake/Output      10/24 0701 - 10/25 0700 10/25 0701 - 10/26 0700   P.O. 600 240   IV Piggyback 100    Total Intake(mL/kg) 700 (12.1) 240 (4.2)   Urine (mL/kg/hr) 1700 (1.2) 600 (2.4)   Stool     Total Output 1700 600   Net -1000 -360          LABS  Results for orders placed or performed during the hospital encounter of 08/17/19 (from the past 24 hour(s))  CBC     Status: Abnormal   Collection Time: 08/21/19  5:40 AM  Result Value Ref Range   WBC 6.2 4.0 - 10.5 K/uL   RBC 3.56 (L) 4.22 - 5.81 MIL/uL   Hemoglobin 11.2 (L) 13.0 - 17.0 g/dL   HCT 08/19/19 (L) 08/23/19 - 99.2 %   MCV 96.6 80.0 - 100.0 fL   MCH 31.5 26.0 - 34.0 pg   MCHC 32.6 30.0 - 36.0 g/dL   RDW 42.6 83.4 - 19.6 %   Platelets 358 150 - 400 K/uL   nRBC 0.0 0.0 - 0.2 %     PHYSICAL EXAM:    Gen: Sitting up in bed, pleasant, frail-appearing male Lungs: Clear and unlabored Cardiac: s1 and s2, reg Abd:  + BS, NTND  Ext:       Left Lower Extremity              Pretty  significant sarcopenia throughout             Surgical wounds are well-healed dressings no longer in place             No signs of infection, no drainage             No swelling             Extremity is warm             + DP pulse             No DCT             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  I can maybe get him to neutral if that  Assessment/Plan: 3 Days Post-Op  Principal Problem:   Closed fracture of left femur, unspecified fracture morphology, initial encounter [facility]) Active Problems:   Essential hypertension   Parkinson's disease ([facility])   Hip fracture ([facility])   Closed compression fracture of L1 vertebra ([facility])   Left rib fracture   Femur fracture ([facility])   Acute lower UTI   Protein-calorie malnutrition, severe   Vitamin D deficiency   Anti-infectives (From admission, onward)   Start     Dose/Rate Route Frequency Ordered Stop   08/18/19 2200  ceFAZolin (ANCEF) IVPB 2g/100 mL premix     2 g 200 mL/hr over 30 Minutes Intravenous Every 8 hours 08/18/19 1825 08/19/19 1350   08/18/19 2000  cefTRIAXone (ROCEPHIN) 1 g in sodium chloride 0.9 % 100 mL IVPB     1 g 200 mL/hr over 30 Minutes Intravenous Every 24 hours 08/18/19 0607     08/18/19 1630  vancomycin (VANCOCIN) powder  Status:  Discontinued       As needed 08/18/19 1615 08/18/19 1709   08/18/19 1147  ceFAZolin (ANCEF) 2-4 GM/100ML-% IVPB    Note to Pharmacy: Sabino NiemannHogue, Samantha   : cabinet override      08/18/19 1147 08/18/19 1550   08/18/19 1145  ceFAZolin (ANCEF) IVPB 2g/100 mL premix     2 g 200 mL/hr over 30 Minutes Intravenous On call to O.R. 08/18/19 1144 08/18/19 1550   08/17/19 2015  cefTRIAXone (ROCEPHIN) 1 g in sodium chloride 0.9 % 100 mL IVPB     1 g 200 mL/hr over 30 Minutes Intravenous  Once 08/17/19 2009 08/17/19 2306    .  POD/HD#: 813  73 year old male s/p fall    -Fall   -Left intertrochanteric femur fracture              Weight-bear as tolerated             Dressing changes as needed             Up with assistance             PT OT             Will eventually discharge back to [facility]   Ortho issues are stable   - Pain management:             Scheduled Tylenol             Tramadol as needed   - ABL anemia/Hemodynamics             Stable   - Medical issues              Per primary   - DVT/PE prophylaxis:             Lovenox  Discharge on asa 325 mg po daily  - ID:              Perioperative antibiotic completed - Metabolic Bone Disease:             + Vitamin D deficiency                         Supplement             Hip fracture indicative of osteoporosis/poor bone density/quality             Likely nutritional component as well.  Continue with nutritional supplementation   - Activity:             Weight-bear as tolerated  with assistance             Up with assistance - FEN/GI prophylaxis/Foley/Lines:             Diet as tolerated             Nutritional supplements   - Impediments to fracture healing:             Poor bone quality             Vitamin D deficiency             Poor nutrition   - Dispo:             Continue with therapies             Follow up with ortho in 7-10 days from discharge from hospital    Jari Pigg, PA-C [phone] (C) 08/21/2019, 11:19 AM  [facility] [place] [address] [phone] Domingo Sep (F)

## 2019-08-21 NOTE — Progress Notes (Signed)
PROGRESS NOTE    Kevin Gould  XVQ:[id number] DOB: 1946-08-29 DOA: 08/17/2019  PCP: Antony Contras, MD    LOS - 4   Brief Narrative: (Start on day 1 of progress note - keep it brief and live) Kevin Theiss Cummingsis a 73 y.o.malewithhistory of advanced dementia, Parkinson's disease, BPH, hypertension was brought into the ER after patient was foundwithincreasing pain in the left hip. As per the report,patient had a mechanicalfall on October 9, 2020and has since hadincreasing pain recentlyas well assome nausea. Patient has advanced dementia and does not contribute much to the history.In the ED,patientwas not in distress, was able tofollow commands. CT of the head did not show any acutefindings,x-rays done showed left proximal femur fracture and left-sided rib fracture involving the eighth ninth and also L1 compression fracture per on-call orthopedic surgeon Dr. Doreatha Martin, whohas been consulted and recommended admissionfor further management including surgery.UA concerning forUTI for which patient was started on ceftriaxone. Patient underwent surgeryafternoon of 10/22.   Subjective @TODAY @: Patient resting comfortably in bed.  No acute events reported overnight.  When awoken, denied any acute complaints, pain well controlled.  No fevers or chills.  Assessment & Plan:   Principal Problem:   Closed fracture of left femur, unspecified fracture morphology, initial encounter [facility]) Active Problems:   Essential hypertension   Parkinson's disease ([place])   Hip fracture ([place])   Closed compression fracture of L1 vertebra ([facility])   Left rib fracture   Femur fracture ([facility])   Acute lower UTI   Protein-calorie malnutrition, severe   Vitamin D deficiency   Left femur fracture- s/p surgical repair 10/22 with Dr. Doreatha Martin Fracture of Left Ribs 8 and 9 L1 Compression Fracture Mechanical Fall ED discussed plan withpatient's cousin Ms. Denton Ar. Above injuries secondary to  mechanical fall at home on 10/9.PT evaluated patient today and recommends SNF on discharge with 24-hour supervision/assistance. - incentive spirometry  - ortho following - pain management - bowel regimen - PT/OT -Case management working on discharge planning to SNF  Vitamin D Deficiency - continue supplementation Likely Osteoporosis/Osteopenia/Metabolic Bone Disease - resulting in fractures  Possible Urinary Tract Infection- unclear if patient symptomatic at this time. Urine culture growing Staph epidermidis which could be contaminant or colonization. Sensitive to Cipro, gentamicin, vanco, rifampin. Reviewed prior urine cultures. 2 months ago grew multiple bacteria. Since patient asymptomatic, afebrile, without leukocytosis, will hold further antibiotics and monitor.  Would start Cipro if symptomatic and no contraindications.  Frequent Stools - with bowel incontinence. - added hold parameter on Senna if loose/diarrhea - miralax PRN for gentle agent, nonstimulant   Possible Atrial Fibrillationversus sinus arrhythmia- unknown if prior diagnosis but pulse irregular on exam postoperatively.EKG in the ER did show sinus arrhythmia with poor R wave progression, QTC 421. - monitor on telemetry  History of Parkinson's- continueon Sinemet.  Advanced dementia- stable, unknown if behavioral issues  Normocytic normochromic anemia - monitor CBC's - expect some decrease following surgery  BPH- continue home Flomax  History of Hypertension- not on medications at home - monitor BP and will treat as needed if elevated   DVT prophylaxis:Lovenox Code Status:Code Status: Full Code Family Communication:None at bedside Disposition Plan:pending SNF bed. Medically stable for discharge.   Consultants:  Orthopedics  Procedures: 10/22:Cephalomedullary nailing of left intertrochanteric femur fracture  Antimicrobials:  Rocephin, start 10/21, stop 10/23    Objective: Vitals:   08/20/19 1942 08/21/19 0334 08/21/19 0745 08/21/19 1500  BP: 124/86 (!) 141/86 (!) 153/101 (!) 148/89  Pulse: 77 73 74  73  Resp: 18 18 17 18   Temp: 98 F (36.7 C) 98.3 F (36.8 C) 98.3 F (36.8 C) 98.3 F (36.8 C)  TempSrc: Axillary Axillary Oral Oral  SpO2: 97% 100% 99% 98%  Weight:      Height:        Intake/Output Summary (Last 24 hours) at 08/21/2019 1644 Last data filed at 08/21/2019 1500 Gross per 24 hour  Intake 700 ml  Output 1400 ml  Net -700 ml   Filed Weights   08/19/19 1246  Weight: 57.7 kg    Examination:  General exam: underweight, no acute distress, resting comfortably Respiratory system: clear to auscultation bilaterally, normal respiratory effort. Cardiovascular system: normal S1/S2, RRR, no pedal edema.   Gastrointestinal: abdomen soft, nontender, nondistended Extremities: no edema, normal tone, surgical site clean no warmth or erthema   Data Reviewed: I have personally reviewed following labs and imaging studies  CBC: Recent Labs  Lab 08/17/19 1602 08/19/19 0255 08/20/19 0349 08/21/19 0540  WBC 7.6 7.7 6.5 6.2  NEUTROABS 5.5  --  4.4  --   HGB 12.5* 11.4* 10.5* 11.2*  HCT 38.1* 34.2* 32.0* 34.4*  MCV 97.7 96.3 96.4 96.6  PLT 276 292 299 358   Basic Metabolic Panel: Recent Labs  Lab 08/17/19 1602 08/19/19 0255  NA 137 136  K 3.8 4.3  CL 102 101  CO2 27 25  GLUCOSE 89 114*  BUN 24* 21  CREATININE 0.69 0.63  CALCIUM 8.8* 8.8*   GFR: Estimated Creatinine Clearance: 69.1 mL/min (by C-G formula based on SCr of 0.63 mg/dL). Liver Function Tests: Recent Labs  Lab 08/17/19 1602  AST 21  ALT 12  ALKPHOS 55  BILITOT 1.1  PROT 5.6*  ALBUMIN 3.0*   No results for input(s): LIPASE, AMYLASE in the last 168 hours. No results for input(s): AMMONIA in the last 168 hours. Coagulation Profile: No results for input(s): INR, PROTIME in the last 168 hours. Cardiac Enzymes: No results for input(s): CKTOTAL,  CKMB, CKMBINDEX, TROPONINI in the last 168 hours. BNP (last 3 results) No results for input(s): PROBNP in the last 8760 hours. HbA1C: No results for input(s): HGBA1C in the last 72 hours. CBG: No results for input(s): GLUCAP in the last 168 hours. Lipid Profile: No results for input(s): CHOL, HDL, LDLCALC, TRIG, CHOLHDL, LDLDIRECT in the last 72 hours. Thyroid Function Tests: No results for input(s): TSH, T4TOTAL, FREET4, T3FREE, THYROIDAB in the last 72 hours. Anemia Panel: No results for input(s): VITAMINB12, FOLATE, FERRITIN, TIBC, IRON, RETICCTPCT in the last 72 hours. Sepsis Labs: No results for input(s): PROCALCITON, LATICACIDVEN in the last 168 hours.  Recent Results (from the past 240 hour(s))  SARS CORONAVIRUS 2 (TAT 6-24 HRS) Nasopharyngeal Nasopharyngeal Swab     Status: None   Collection Time: 08/17/19  4:37 PM   Specimen: Nasopharyngeal Swab  Result Value Ref Range Status   SARS Coronavirus 2 NEGATIVE NEGATIVE Final    Comment: (NOTE) SARS-CoV-2 target nucleic acids are NOT DETECTED. The SARS-CoV-2 RNA is generally detectable in upper and lower respiratory specimens during the acute phase of infection. Negative results do not preclude SARS-CoV-2 infection, do not rule out co-infections with other pathogens, and should not be used as the sole basis for treatment or other patient management decisions. Negative results must be combined with clinical observations, patient history, and epidemiological information. The expected result is Negative. Fact Sheet for Patients: HairSlick.nohttps://www.fda.gov/media/138098/download Fact Sheet for Healthcare Providers: quierodirigir.comhttps://www.fda.gov/media/138095/download This test is not yet approved or cleared  by the [place] and  has been authorized for detection and/or diagnosis of SARS-CoV-2 by FDA under an Emergency Use Authorization (EUA). This EUA will remain  in effect (meaning this test can be used) for the duration of the COVID-19  declaration under Section 56 4(b)(1) of the Act, 21 U.S.C. section 360bbb-3(b)(1), unless the authorization is terminated or revoked sooner. Performed at [facility], [address] [place], [place] [address]   Urine culture     Status: Abnormal   Collection Time: 08/17/19  7:00 PM   Specimen: Urine, Random  Result Value Ref Range Status   Specimen Description URINE, RANDOM  Final   Special Requests   Final    NONE Performed at [facility], [address] [place], [place] [address]    Culture >=100,000 COLONIES/mL STAPHYLOCOCCUS EPIDERMIDIS (A)  Final   Report Status 08/20/2019 FINAL  Final   Organism ID, Bacteria STAPHYLOCOCCUS EPIDERMIDIS (A)  Final      Susceptibility   Staphylococcus epidermidis - MIC*    CIPROFLOXACIN <=0.5 SENSITIVE Sensitive     GENTAMICIN <=0.5 SENSITIVE Sensitive     NITROFURANTOIN 64 INTERMEDIATE Intermediate     OXACILLIN 1 RESISTANT Resistant     TETRACYCLINE >=16 RESISTANT Resistant     VANCOMYCIN 1 SENSITIVE Sensitive     TRIMETH/SULFA >=320 RESISTANT Resistant     CLINDAMYCIN RESISTANT Resistant     RIFAMPIN <=0.5 SENSITIVE Sensitive     Inducible Clindamycin POSITIVE Resistant     * >=100,000 COLONIES/mL STAPHYLOCOCCUS EPIDERMIDIS  MRSA PCR Screening     Status: None   Collection Time: 08/19/19  4:54 AM   Specimen: Nasal Mucosa; Nasopharyngeal  Result Value Ref Range Status   MRSA by PCR NEGATIVE NEGATIVE Final    Comment:        The GeneXpert MRSA Assay (FDA approved for NASAL specimens only), is one component of a comprehensive MRSA colonization surveillance program. It is not intended to diagnose MRSA infection nor to guide or monitor treatment for MRSA infections. Performed at [facility], [address], [place], [place] [address]          Radiology Studies: No results found.      Scheduled Meds: . acetaminophen  1,000 mg Oral Q6H  . carbidopa-levodopa  1 tablet Oral TID  . cholecalciferol   2,000 Units Oral TID WC  . enoxaparin (LOVENOX) injection  40 mg Subcutaneous Q24H  . escitalopram  5 mg Oral Daily  . famotidine  20 mg Oral BID  . feeding supplement (ENSURE ENLIVE)  237 mL Oral BID BM  . multivitamin with minerals  1 tablet Oral Daily  . senna  8.6 mg Oral Daily  . tamsulosin  0.4 mg Oral QHS  . vitamin C  500 mg Oral Daily   Continuous Infusions: . lactated ringers 10 mL/hr at 08/18/19 1158     LOS: 4 days    Time spent: 15-20 min    Pennie Banter, DO [facility] Pager: [phone]  If 7PM-7AM, please contact night-coverage [email or web address] Password [facility] 08/21/2019, 4:44 PM

## 2019-08-21 NOTE — Plan of Care (Signed)
  Problem: Activity: Goal: Ability to ambulate and perform ADLs will improve Outcome: Progressing   Problem: Pain Management: Goal: Pain level will decrease Outcome: Progressing   Problem: Clinical Measurements: Goal: Will remain free from infection Outcome: Progressing   Problem: Coping: Goal: Level of anxiety will decrease Outcome: Progressing   Problem: Safety: Goal: Ability to remain free from injury will improve Outcome: Progressing   Problem: Skin Integrity: Goal: Risk for impaired skin integrity will decrease Outcome: Progressing

## 2019-08-22 DIAGNOSIS — S72142D Displaced intertrochanteric fracture of left femur, subsequent encounter for closed fracture with routine healing: Secondary | ICD-10-CM

## 2019-08-22 LAB — SARS CORONAVIRUS 2 (TAT 6-24 HRS): SARS Coronavirus 2: NEGATIVE

## 2019-08-22 MED ORDER — TRAMADOL HCL 50 MG PO TABS: 50.0000 mg | ORAL_TABLET | Freq: Four times a day (QID) | ORAL | 0 refills | Status: AC | PRN

## 2019-08-22 MED ORDER — ENSURE ENLIVE PO LIQD: 237.0000 mL | Freq: Two times a day (BID) | ORAL | 12 refills | Status: DC

## 2019-08-22 MED ORDER — ASCORBIC ACID 500 MG PO TABS: 500.0000 mg | ORAL_TABLET | Freq: Every day | ORAL | 0 refills | Status: DC

## 2019-08-22 MED ORDER — ADULT MULTIVITAMIN W/MINERALS CH: 1.0000 | ORAL_TABLET | Freq: Every day | ORAL | Status: DC

## 2019-08-22 MED ORDER — POLYETHYLENE GLYCOL 3350 17 G PO PACK: 17.0000 g | PACK | Freq: Every day | ORAL | 0 refills | Status: DC | PRN

## 2019-08-22 NOTE — TOC Transition Note (Signed)
Transition of Care [facility]) - CM/SW Discharge Note   Patient Details  Name: IMMANUEL FEDAK MRN: [id number] Date of Birth: 06-08-1946  Transition of Care [facility]) CM/SW Contact:  Midge Minium RN, BSN, NCM-BC, ACM-RN [phone] Phone Number: 08/22/2019, 12:51 PM   Clinical Narrative:    CM following for dispositional needs. Patient is medically stable to transition back to [facility] with insurance auth received and patient medically stable, per Dr. Arbutus Ped. CM spoke to [facility]) with an update provided; paperwork has been completed by the POA, per Narda Rutherford ([facility] liaison). PTAR will be arranged for 1400 today.  Please call report to: [phone]; Room # 3250.     Final next level of care: Skilled Nursing Facility Barriers to Discharge: No Barriers Identified   Patient Goals and CMS Choice   CMS Medicare.gov Compare Post Acute Care list provided to:: Legal CBULAGTX(MIWOEH,OZYYQ) Choice offered to / list presented to : [facility] POA / Guardian(Molnar,[name])  Discharge Placement              Patient chooses bed at: [facility] Patient to be transferred to facility by: [facility] Name of family member notified: Molnar,[name] ([place]) Patient and family notified of of transfer: 08/22/19  Discharge Plan and Services                DME Arranged: N/A DME Agency: NA       HH Arranged: NA HH Agency: NA        Social Determinants of Health (SDOH) Interventions     Readmission Risk Interventions No flowsheet data found.

## 2019-08-22 NOTE — Discharge Summary (Signed)
Physician Discharge Summary  Kevin Gould:[id number] DOB: [birth year] DOA: 08/17/2019  PCP: [name], MD  Admit date: 08/17/2019 Discharge date: 08/22/2019  Ad[facility] From: [facility] Disposition: SNF, [facility]   Recommendations for Outpatient Follow-up:  1. Follow up with PCP in 1-2 weeks 2. Please obtain BMP/CBC in one week   Home Health: No  Equipment/Devices: Dressing changes as needed  Discharge Condition: Stable CODE STATUS: Full Diet recommendation: Heart Healthy   Brief/Interim Summary:  Kevin Denning Cummingsis a 73 y.o.malewithhistory of advanced dementia, Parkinson's disease, BPH, hypertension was brought into the ER after patient was foundwithincreasing pain in the left hip. As per the report,patient had a mechanicalfall on October 9, 2020and has since hadincreasing pain recentlyas well assome nausea. Patient has advanced dementia and does not contribute much to the history.In the ED,patientwas not in distress, was able tofollow commands. CT of the head did not show any acutefindings,x-rays done showed left proximal femur fracture and left-sided rib fracture involving the eighth ninth and also L1 compression fracture per on-call orthopedic surgeon Dr. Jena Gauss, whohas been consulted and recommended admissionfor further management including surgery.UA concerning forUTI for which patient was started on ceftriaxone.  Urine culture consistent with likely with contaminant versus consolidation, similar to prior urine cultures that show multiple bacteria.  Antibiotics were stopped and patient remained asymptomatic, afebrile and without leukocytosis. Patient underwent surgeryafternoon of 10/22.  Patient evaluated and worked with physical therapy postoperatively.  They recommended SNF with 24-hour supervision and assistance.    Discharge Diagnoses: Principal Problem:   Closed comminuted intertrochanteric fracture of left femur (HCC) Active  Problems:   Essential hypertension   Parkinson's disease (HCC)   Hip fracture (HCC)   Closed compression fracture of L1 vertebra (HCC)   Left rib fracture   Femur fracture (HCC)   Acute lower UTI   Protein-calorie malnutrition, severe   Vitamin D deficiency    Discharge Instructions  Per Ortho:  Weight-bear as tolerated Dressing changes as needed Up with assistance  Scheduled Tylenol for pain   Tramadol PRN pain not controlled with Tylenol   Discharge Instructions    Call MD for:  redness, tenderness, or signs of infection (pain, swelling, redness, odor or green/yellow discharge around incision site)   Complete by: As directed    Call MD for:  severe uncontrolled pain   Complete by: As directed    Call MD for:  temperature >100.4   Complete by: As directed    Diet - low sodium heart healthy   Complete by: As directed    Increase activity slowly   Complete by: As directed      Allergies as of 08/22/2019   No Known Allergies     Medication List    STOP taking these medications   cyanocobalamin 1000 MCG/ML injection Commonly known as: (VITAMIN B-12)     TAKE these medications   acetaminophen 500 MG tablet Commonly known as: TYLENOL Take 500-1,000 mg by mouth See admin instructions. Take 1,000 mg by mouth two times a day and 500 mg every six hours as needed for pain   ascorbic acid 500 MG tablet Commonly known as: VITAMIN C Take 1 tablet (500 mg total) by mouth daily. Start taking on: August 23, 2019   Aspercreme w/Lidocaine 4 % Crea Generic drug: Lidocaine HCl Apply 1 application topically See admin instructions. Apply to left shoulder three times a day as needed for pain   carbidopa-levodopa 25-100 MG disintegrating tablet Commonly known as: PARCOPA Take 1 tablet  by mouth See admin instructions. Dissolve 1 tablet in the mouth at 8 AM, 1 tablet at 1 PM, and 1 tablet at 8 PM What changed: Another medication with the same name was  removed. Continue taking this medication, and follow the directions you see here.   escitalopram 5 MG tablet Commonly known as: LEXAPRO Take 5 mg by mouth daily.   famotidine 20 MG tablet Commonly known as: PEPCID Take 20 mg by mouth 2 (two) times daily. What changed: Another medication with the same name was removed. Continue taking this medication, and follow the directions you see here.   feeding supplement (ENSURE ENLIVE) Liqd Take 237 mLs by mouth 2 (two) times daily between meals.   Geri-kot 8.6 MG tablet Generic drug: senna Take 1 tablet by mouth daily.   lidocaine 5 % Commonly known as: LIDODERM Place 1 patch onto the skin See admin instructions. Apply 1 patch to lower back once a day and remove & discard patch within 12 hours or as directed by MD   multivitamin with minerals Tabs tablet Take 1 tablet by mouth daily. Start taking on: August 23, 2019   NON FORMULARY Take 120 mLs by mouth See admin instructions. MedPass: Drink 120 ml's by mouth three times a day   polyethylene glycol 17 g packet Commonly known as: MIRALAX / GLYCOLAX Take 17 g by mouth daily as needed for mild constipation or moderate constipation.   tamsulosin 0.4 MG Caps capsule Commonly known as: FLOMAX Take 0.4 mg by mouth at bedtime.   traMADol 50 MG tablet Commonly known as: ULTRAM Take 1 tablet (50 mg total) by mouth every 6 (six) hours as needed for up to 5 days for moderate pain.   Vitamin D3 50 MCG (2000 UT) Tabs Take 2,000 Units by mouth daily.       No Known Allergies  Consultations:  Orthopedics   Procedures/Studies: Dg Chest 1 View  Result Date: 08/17/2019 CLINICAL DATA:  Pain secondary to a fall on 08/05/2019. Proximal left femur fracture. L1 fracture. EXAM: CHEST  1 VIEW COMPARISON:  Chest x-ray dated 06/16/2019 FINDINGS: The heart size and pulmonary vascularity are normal. Aortic atherosclerosis. No infiltrates or effusions. Chronic elevation of the left hemidiaphragm.  There appear to be 2 fractures of the lateral aspect of the left eighth and ninth ribs. IMPRESSION: 1. Fractures of the lateral aspects of the left eighth and ninth ribs. 2. Aortic atherosclerosis. 3. Chronic elevation of the left hemidiaphragm. Electronically Signed   By: Francene Boyers M.D.   On: 08/17/2019 19:00   Dg Lumbar Spine Complete  Result Date: 08/17/2019 CLINICAL DATA:  Left hip pain secondary to a fall on 08/05/2019. EXAM: LUMBAR SPINE - COMPLETE 4+ VIEW COMPARISON:  None. FINDINGS: There is a mild compression fracture of the superior endplate of L1, age indeterminate. The remainder of the lumbar spine appears normal. IMPRESSION: Mild compression fracture of the superior endplate of L1, age indeterminate. Electronically Signed   By: Francene Boyers M.D.   On: 08/17/2019 18:52   Ct Head Wo Contrast  Result Date: 08/17/2019 CLINICAL DATA:  Fall 10 days ago with trauma to the head. EXAM: CT HEAD WITHOUT CONTRAST TECHNIQUE: Contiguous axial images were obtained from the base of the skull through the vertex without intravenous contrast. COMPARISON:  06/16/2019 FINDINGS: Brain: The study suffers from motion artifact. There is chronic ventriculomegaly of the lateral and third ventricles which appears unchanged from the previous studies. 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 No sign of acute infarction, mass lesion, hemorrhage or extra-axial collection. Vascular: There is atherosclerotic calcification of the major vessels at the base of the brain. Skull: Negative Sinuses/Orbits: Clear/normal Other: None IMPRESSION: No acute or traumatic finding by CT. Chronic ventriculomegaly of the lateral and third ventricles. 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 Electronically Signed   By: Paulina Fusi M.D.   On: 08/17/2019 18:43   Dg Shoulder Left  Result Date: 08/17/2019 CLINICAL DATA:  Pain secondary to a fall on 08/05/2019 EXAM: LEFT SHOULDER - 2+ VIEW COMPARISON:  None. FINDINGS: There is no evidence of fracture or dislocation. There is no evidence of arthropathy or other focal bone abnormality. Soft tissues are unremarkable. IMPRESSION: Negative. Electronically Signed   By: Francene Boyers M.D.   On: 08/17/2019 18:54   Dg Knee Complete 4 Views Left  Result Date: 08/17/2019 CLINICAL DATA:  Pain secondary to a fall on 08/05/2019 EXAM: LEFT KNEE - COMPLETE 4+ VIEW COMPARISON:  None. FINDINGS: No evidence of fracture, dislocation, or joint effusion. No evidence of arthropathy or other focal bone abnormality. Soft tissues are unremarkable. IMPRESSION: Negative. Electronically Signed   By: Francene Boyers M.D.   On: 08/17/2019 18:53   Dg C-arm 1-60 Min  Result Date: 08/18/2019 CLINICAL DATA:  ORIF left hip EXAM: OPERATIVE LEFT HIP (WITH PELVIS IF PERFORMED) 4 VIEWS TECHNIQUE: Fluoroscopic spot image(s) were submitted for interpretation post-operatively. FINDINGS: ORIF left hip fracture. Hardware intact. Anatomic alignment. IMPRESSION: ORIF left hip fracture.  Hardware intact.  Anatomic alignment. Electronically Signed   By: Maisie Fus  Register   On: 08/18/2019 16:44   Dg Hip Operative Unilat W Or W/o Pelvis Left  Result Date: 08/18/2019 CLINICAL DATA:  ORIF left hip EXAM: OPERATIVE LEFT HIP (WITH PELVIS IF PERFORMED) 4 VIEWS TECHNIQUE: Fluoroscopic spot image(s) were submitted for interpretation post-operatively. FINDINGS: ORIF left hip fracture. Hardware intact. Anatomic alignment. IMPRESSION: ORIF left hip fracture.  Hardware intact.  Anatomic alignment. Electronically Signed   By: Maisie Fus  Register   On: 08/18/2019 16:44   Dg Hip Unilat W Or W/o Pelvis 2-3 Views Left  Result Date:  08/18/2019 CLINICAL DATA:  Fracture EXAM: DG HIP (WITH OR WITHOUT PELVIS) 2-3V LEFT COMPARISON:  08/18/2019 and 08/17/2019 FINDINGS: Status post intramedullary rod and screw fixation of comminuted left intertrochanteric fracture. Gas in the soft tissues consistent with recent surgery. Femoral head alignment is normal. Phleboliths in the pelvis. IMPRESSION: Interval surgical fixation of left intertrochanteric fracture with expected postsurgical change Electronically Signed   By: Jasmine Pang M.D.   On: 08/18/2019 18:18   Dg Hip Unilat W Or Wo Pelvis 2-3 Views Left  Result Date: 08/17/2019 CLINICAL DATA:  Left hip pain since a fall dated 08/05/2019. EXAM: DG HIP (WITH OR WITHOUT PELVIS) 2-3V LEFT COMPARISON:  CT scan of the left hip dated 06/17/2019 FINDINGS: There is an angulated fracture of the proximal left femur involving the greater trochanter and extending into the intertrochanteric region. The fracture only extends into proximal aspect of the lesser trochanter. Pelvic bones are normal. IMPRESSION: Angulated fracture of the proximal left femur as described. Electronically Signed   By: Francene Boyers M.D.   On: 08/17/2019 18:49      Surgical repair of  left intertrochanteric femur fracture   Subjective: Patient seen and examined, sitting up in bed.  PT in room to be working with him.  Patient denies any acute complaints including fevers, chills, abdominal pain nausea vomiting or diarrhea, chest pain or shortness of breath.  States pain at a 4 or 5 today.  States overall feels well.   Discharge Exam: Vitals:   08/22/19 0312 08/22/19 0833  BP: (!) 147/91 126/84  Pulse: 65 77  Resp: 19 18  Temp: 98.1 F (36.7 C) 97.7 F (36.5 C)  SpO2: 100% 98%   Vitals:   08/21/19 1500 08/21/19 1932 08/22/19 0312 08/22/19 0833  BP: (!) 148/89 116/79 (!) 147/91 126/84  Pulse: 73 70 65 77  Resp: 18  19 18   Temp: 98.3 F (36.8 C) 98.3 F (36.8 C) 98.1 F (36.7 C) 97.7 F (36.5 C)  TempSrc: Oral  Oral Oral Oral  SpO2: 98% 98% 100% 98%  Weight:      Height:        General: Pt is alert, awake, not in acute distress Cardiovascular: Irregular rhythm, normal rate, S1/S2 +, no rubs, no gallops Respiratory: CTA bilaterally, no wheezing, no rhonchi Abdominal: Soft, NT, ND, bowel sounds + Extremities: no edema, no cyanosis    The results of significant diagnostics from this hospitalization (including imaging, microbiology, ancillary and laboratory) are listed below for reference.     Microbiology: Recent Results (from the past 240 hour(s))  SARS CORONAVIRUS 2 (TAT 6-24 HRS) Nasopharyngeal Nasopharyngeal Swab     Status: None   Collection Time: 08/17/19  4:37 PM   Specimen: Nasopharyngeal Swab  Result Value Ref Range Status   SARS Coronavirus 2 NEGATIVE NEGATIVE Final    Comment: (NOTE) SARS-CoV-2 target nucleic acids are NOT DETECTED. The SARS-CoV-2 RNA is generally detectable in upper and lower respiratory specimens during the acute phase of infection. Negative results do not preclude SARS-CoV-2 infection, do not rule out co-infections with other pathogens, and should not be used as the sole basis for treatment or other patient management decisions. Negative results must be combined with clinical observations, patient history, and epidemiological information. The expected result is Negative. Fact Sheet for Patients: HairSlick.nohttps://www.fda.gov/media/138098/download Fact Sheet for Healthcare Providers: quierodirigir.comhttps://www.fda.gov/media/138095/download This test is not yet approved or cleared by the [place] FDA and  has been authorized for detection and/or diagnosis of SARS-CoV-2 by FDA under an Emergency Use Authorization (EUA). This EUA will remain  in effect (meaning this test can be used) for the duration of the COVID-19 declaration under Section 56 4(b)(1) of the Act, 21 U.S.C. section 360bbb-3(b)(1), unless the authorization is terminated or revoked sooner. Performed at [facility], [address] [place], [place] [address]   Urine culture     Status: Abnormal   Collection Time: 08/17/19  7:00 PM   Specimen: Urine, Random  Result Value Ref Range Status   Specimen Description URINE, RANDOM  Final   Special Requests   Final    NONE Performed at [facility], [address] [place], [place] [address]    Culture >=100,000 COLONIES/mL STAPHYLOCOCCUS EPIDERMIDIS (A)  Final   Report Status 08/20/2019 FINAL  Final   Organism ID, Bacteria STAPHYLOCOCCUS EPIDERMIDIS (A)  Final      Susceptibility   Staphylococcus epidermidis - MIC*    CIPROFLOXACIN <=0.5 SENSITIVE Sensitive     GENTAMICIN <=0.5 SENSITIVE Sensitive     NITROFURANTOIN 64 INTERMEDIATE Intermediate     OXACILLIN 1 RESISTANT Resistant  TETRACYCLINE >=16 RESISTANT Resistant     VANCOMYCIN 1 SENSITIVE Sensitive     TRIMETH/SULFA >=320 RESISTANT Resistant     CLINDAMYCIN RESISTANT Resistant     RIFAMPIN <=0.5 SENSITIVE Sensitive     Inducible Clindamycin POSITIVE Resistant     * >=100,000 COLONIES/mL STAPHYLOCOCCUS EPIDERMIDIS  MRSA PCR Screening     Status: None   Collection Time: 08/19/19  4:54 AM   Specimen: Nasal Mucosa; Nasopharyngeal  Result Value Ref Range Status   MRSA by PCR NEGATIVE NEGATIVE Final    Comment:        The GeneXpert MRSA Assay (FDA approved for NASAL specimens only), is one component of a comprehensive MRSA colonization surveillance program. It is not intended to diagnose MRSA infection nor to guide or monitor treatment for MRSA infections. Performed at [facility], [address], [place], [place] [address]      Labs: BNP (last 3 results) No results for input(s): BNP in the last 8760 hours. Basic Metabolic Panel: Recent Labs  Lab 08/17/19 1602 08/19/19 0255  NA 137 136  K 3.8 4.3  CL 102 101  CO2 27 25  GLUCOSE 89 114*  BUN 24* 21  CREATININE 0.69 0.63  CALCIUM 8.8* 8.8*   Liver Function Tests: Recent Labs  Lab  08/17/19 1602  AST 21  ALT 12  ALKPHOS 55  BILITOT 1.1  PROT 5.6*  ALBUMIN 3.0*   No results for input(s): LIPASE, AMYLASE in the last 168 hours. No results for input(s): AMMONIA in the last 168 hours. CBC: Recent Labs  Lab 08/17/19 1602 08/19/19 0255 08/20/19 0349 08/21/19 0540  WBC 7.6 7.7 6.5 6.2  NEUTROABS 5.5  --  4.4  --   HGB 12.5* 11.4* 10.5* 11.2*  HCT 38.1* 34.2* 32.0* 34.4*  MCV 97.7 96.3 96.4 96.6  PLT 276 292 299 358   Cardiac Enzymes: No results for input(s): CKTOTAL, CKMB, CKMBINDEX, TROPONINI in the last 168 hours. BNP: Invalid input(s): POCBNP CBG: No results for input(s): GLUCAP in the last 168 hours. D-Dimer No results for input(s): DDIMER in the last 72 hours. Hgb A1c No results for input(s): HGBA1C in the last 72 hours. Lipid Profile No results for input(s): CHOL, HDL, LDLCALC, TRIG, CHOLHDL, LDLDIRECT in the last 72 hours. Thyroid function studies No results for input(s): TSH, T4TOTAL, T3FREE, THYROIDAB in the last 72 hours.  Invalid input(s): FREET3 Anemia work up No results for input(s): VITAMINB12, FOLATE, FERRITIN, TIBC, IRON, RETICCTPCT in the last 72 hours. Urinalysis    Component Value Date/Time   COLORURINE YELLOW 08/17/2019 1837   APPEARANCEUR CLEAR 08/17/2019 1837   LABSPEC 1.032 (H) 08/17/2019 1837   PHURINE 5.0 08/17/2019 1837   GLUCOSEU NEGATIVE 08/17/2019 1837   HGBUR NEGATIVE 08/17/2019 1837   BILIRUBINUR NEGATIVE 08/17/2019 1837   KETONESUR 5 (A) 08/17/2019 1837   PROTEINUR NEGATIVE 08/17/2019 1837   UROBILINOGEN 0.2 11/30/2013 1223   NITRITE POSITIVE (A) 08/17/2019 1837   LEUKOCYTESUR SMALL (A) 08/17/2019 1837   Sepsis Labs Invalid input(s): PROCALCITONIN,  WBC,  LACTICIDVEN Microbiology Recent Results (from the past 240 hour(s))  SARS CORONAVIRUS 2 (TAT 6-24 HRS) Nasopharyngeal Nasopharyngeal Swab     Status: None   Collection Time: 08/17/19  4:37 PM   Specimen: Nasopharyngeal Swab  Result Value Ref Range Status    SARS Coronavirus 2 NEGATIVE NEGATIVE Final    Comment: (NOTE) SARS-CoV-2 target nucleic acids are NOT DETECTED. The SARS-CoV-2 RNA is generally detectable in upper and lower respiratory specimens  during the acute phase of infection. Negative results do not preclude SARS-CoV-2 infection, do not rule out co-infections with other pathogens, and should not be used as the sole basis for treatment or other patient management decisions. Negative results must be combined with clinical observations, patient history, and epidemiological information. The expected result is Negative. Fact Sheet for Patients: SugarRoll.be Fact Sheet for Healthcare Providers: https://www.woods-mathews.com/ This test is not yet approved or cleared by the [place] FDA and  has been authorized for detection and/or diagnosis of SARS-CoV-2 by FDA under an Emergency Use Authorization (EUA). This EUA will remain  in effect (meaning this test can be used) for the duration of the COVID-19 declaration under Section 56 4(b)(1) of the Act, 21 U.S.C. section 360bbb-3(b)(1), unless the authorization is terminated or revoked sooner. Performed at [facility], [place] [address] [place], [place] [address]   Urine culture     Status: Abnormal   Collection Time: 08/17/19  7:00 PM   Specimen: Urine, Random  Result Value Ref Range Status   Specimen Description URINE, RANDOM  Final   Special Requests   Final    NONE Performed at [facility], [place] [address] [place], [place] [address]    Culture >=100,000 COLONIES/mL STAPHYLOCOCCUS EPIDERMIDIS (A)  Final   Report Status 08/20/2019 FINAL  Final   Organism ID, Bacteria STAPHYLOCOCCUS EPIDERMIDIS (A)  Final      Susceptibility   Staphylococcus epidermidis - MIC*    CIPROFLOXACIN <=0.5 SENSITIVE Sensitive     GENTAMICIN <=0.5 SENSITIVE Sensitive     NITROFURANTOIN 64 INTERMEDIATE Intermediate     OXACILLIN 1 RESISTANT  Resistant     TETRACYCLINE >=16 RESISTANT Resistant     VANCOMYCIN 1 SENSITIVE Sensitive     TRIMETH/SULFA >=320 RESISTANT Resistant     CLINDAMYCIN RESISTANT Resistant     RIFAMPIN <=0.5 SENSITIVE Sensitive     Inducible Clindamycin POSITIVE Resistant     * >=100,000 COLONIES/mL STAPHYLOCOCCUS EPIDERMIDIS  MRSA PCR Screening     Status: None   Collection Time: 08/19/19  4:54 AM   Specimen: Nasal Mucosa; Nasopharyngeal  Result Value Ref Range Status   MRSA by PCR NEGATIVE NEGATIVE Final    Comment:        The GeneXpert MRSA Assay (FDA approved for NASAL specimens only), is one component of a comprehensive MRSA colonization surveillance program. It is not intended to diagnose MRSA infection nor to guide or monitor treatment for MRSA infections. Performed at [facility], [place] [address] [place], [place] [address]      Time coordinating discharge: Over 30 minutes  SIGNED:   Ezekiel Slocumb, DO [facility] 08/22/2019, 12:26 PM Pager [phone]  If 7PM-7AM, please contact night-coverage [email or web address] Password [email or web address]

## 2019-08-22 NOTE — Progress Notes (Signed)
Physical Therapy Treatment Patient Details Name: ANTAEUS KAREL MRN: [id number] DOB: 1946/10/16 Today's Date: 08/22/2019    History of Present Illness DARIEL BETZER is a 73 y.o. male with history of advanced dementia, Parkinson's disease, BPH, hypertension was brought into the ER after patient was found with increasing pain in the left hip.  As per the report, patient had a mechanical fall on August 05, 2019 and has since had increasing pain recently as well as some nausea. Patient has advanced dementia and does not contribute much to the history.  In the ED, patient was not in distress, was able to follow commands.  CT of the head did not show any acute findings, x-rays done showed left proximal femur fracture and left-sided rib fracture involving the eighth ninth and also L1 compression fracture per on-call orthopedic surgeon Dr. Doreatha Martin, who has been consulted and recommended admission for further management including surgery..  Pt undersent left IM nail on 10/22.      PT Comments    Patient received in bed, pleasantly confused and A&Ox1 today. Required MaxA for rolling as well as MaxA for sidelying to sit/sit to sidelying today. Once at EOB, able to maintain balance with min guard for safety with no UE support and S when holding onto rail of stedy. Performed sit to stand x3 in stedy with maxA, unable to extend hips enough to flip stedy pads under his hips and no +2 assist available today. He was returned to bed with maxA and left in bed with all needs met, bed alarm active this morning. Continue to strongly recommend SNF.    Follow Up Recommendations  SNF;Supervision/Assistance - 24 hour     Equipment Recommendations  Other (comment)(defer)    Recommendations for Other Services       Precautions / Restrictions Precautions Precautions: Fall Restrictions Weight Bearing Restrictions: Yes LLE Weight Bearing: Weight bearing as tolerated    Mobility  Bed Mobility Overal bed  mobility: Needs Assistance Bed Mobility: Rolling;Sidelying to Sit;Sit to Sidelying Rolling: Max assist Sidelying to sit: Max assist     Sit to sidelying: Max assist General bed mobility comments: maxA for all bed mobility, max cues for hand placement and sequencing  Transfers Overall transfer level: Needs assistance   Transfers: Sit to/from Stand Sit to Stand: Max assist         General transfer comment: MaxA for sit to stand with 3 attempts, able to stand but with very flexed posture and unable to get hips extended enough to slip steady pads under patient's hips  Ambulation/Gait             General Gait Details: unable   Stairs             Wheelchair Mobility    Modified Rankin (Stroke Patients Only)       Balance Overall balance assessment: Needs assistance Sitting-balance support: Feet supported;Bilateral upper extremity supported Sitting balance-Leahy Scale: Good Sitting balance - Comments: holding onto stedy cross rail Postural control: Posterior lean Standing balance support: Bilateral upper extremity supported;During functional activity Standing balance-Leahy Scale: Zero                              Cognition Arousal/Alertness: Awake/alert Behavior During Therapy: Flat affect Overall Cognitive Status: History of cognitive impairments - at baseline  General Comments: hx of dementia, more cognitively clear today and able to reflect on parkinsons diagnosis      Exercises      General Comments        Pertinent Vitals/Pain Pain Assessment: Faces Pain Score: 0-No pain Faces Pain Scale: No hurt Pain Intervention(s): Limited activity within patient's tolerance;Monitored during session    Home Living                      Prior Function            PT Goals (current goals can now be found in the care plan section) Acute Rehab PT Goals Patient Stated Goal: get better PT  Goal Formulation: Patient unable to participate in goal setting Time For Goal Achievement: 09/02/19 Potential to Achieve Goals: Fair Progress towards PT goals: Progressing toward goals    Frequency    Min 2X/week      PT Plan Current plan remains appropriate    Co-evaluation              AM-PAC PT "6 Clicks" Mobility   Outcome Measure  Help needed turning from your back to your side while in a flat bed without using bedrails?: A Lot Help needed moving from lying on your back to sitting on the side of a flat bed without using bedrails?: A Lot Help needed moving to and from a bed to a chair (including a wheelchair)?: Total Help needed standing up from a chair using your arms (e.g., wheelchair or bedside chair)?: Total Help needed to walk in hospital room?: Total Help needed climbing 3-5 steps with a railing? : Total 6 Click Score: 8    End of Session   Activity Tolerance: Patient limited by fatigue Patient left: in bed;with call bell/phone within reach;with bed alarm set   PT Visit Diagnosis: Unsteadiness on feet (R26.81);Pain Pain - Right/Left: Left Pain - part of body: Leg     Time: 8372-9021 PT Time Calculation (min) (ACUTE ONLY): 26 min  Charges:  $Therapeutic Activity: 23-37 mins                     Deniece Ree PT, DPT, CBIS  Supplemental Physical Therapist [place]    Pager [phone] Acute Rehab Office [phone]

## 2019-08-22 NOTE — Care Management Important Message (Signed)
Important Message  Patient Details  Name: Kevin Gould MRN: [id number] Date of Birth: 1946/07/23   Medicare Important Message Given:  Yes     [name] 08/22/2019, 3:41 PM

## 2019-08-22 NOTE — Plan of Care (Signed)
  Problem: Activity: Goal: Ability to ambulate and perform ADLs will improve Outcome: Progressing   Problem: Self-Concept: Goal: Ability to maintain and perform role responsibilities to the fullest extent possible will improve Outcome: Progressing   Problem: Pain Management: Goal: Pain level will decrease Outcome: Progressing   Problem: Nutrition: Goal: Adequate nutrition will be maintained Outcome: Progressing   Problem: Coping: Goal: Level of anxiety will decrease Outcome: Progressing   Problem: Safety: Goal: Ability to remain free from injury will improve Outcome: Progressing   Problem: Skin Integrity: Goal: Risk for impaired skin integrity will decrease Outcome: Progressing

## 2019-08-22 NOTE — Progress Notes (Signed)
A written discharge packet was printed and sent via [facility]. Nurse report was given to Dorma Russell, RN

## 2019-08-22 NOTE — Progress Notes (Signed)
   08/22/19 1023  SNF Authorization Status  SNF Authorization Complete 08/22/19  SNF Auth Complete Time 1023  SNF Authorization Status Complete   Insurance auth received from [facility] Bernadene Bell); Auth #: [id number]

## 2019-08-22 NOTE — Progress Notes (Signed)
Contact [facility], RN not available. Left message to call back. Will try again shortly.

## 2020-10-27 DIAGNOSIS — S2232XD Fracture of one rib, left side, subsequent encounter for fracture with routine healing: Secondary | ICD-10-CM | POA: Diagnosis not present

## 2020-10-27 DIAGNOSIS — S32010D Wedge compression fracture of first lumbar vertebra, subsequent encounter for fracture with routine healing: Secondary | ICD-10-CM | POA: Diagnosis not present

## 2020-10-27 DIAGNOSIS — E7849 Other hyperlipidemia: Secondary | ICD-10-CM | POA: Diagnosis not present

## 2020-10-27 DIAGNOSIS — E538 Deficiency of other specified B group vitamins: Secondary | ICD-10-CM | POA: Diagnosis not present

## 2020-10-27 DIAGNOSIS — E43 Unspecified severe protein-calorie malnutrition: Secondary | ICD-10-CM | POA: Diagnosis not present

## 2020-10-27 DIAGNOSIS — S72002D Fracture of unspecified part of neck of left femur, subsequent encounter for closed fracture with routine healing: Secondary | ICD-10-CM | POA: Diagnosis not present

## 2020-10-27 DIAGNOSIS — S72142D Displaced intertrochanteric fracture of left femur, subsequent encounter for closed fracture with routine healing: Secondary | ICD-10-CM | POA: Diagnosis not present

## 2020-10-27 DIAGNOSIS — I10 Essential (primary) hypertension: Secondary | ICD-10-CM | POA: Diagnosis not present

## 2020-10-29 DIAGNOSIS — S72142D Displaced intertrochanteric fracture of left femur, subsequent encounter for closed fracture with routine healing: Secondary | ICD-10-CM | POA: Diagnosis not present

## 2020-10-29 DIAGNOSIS — E43 Unspecified severe protein-calorie malnutrition: Secondary | ICD-10-CM | POA: Diagnosis not present

## 2020-10-29 DIAGNOSIS — E538 Deficiency of other specified B group vitamins: Secondary | ICD-10-CM | POA: Diagnosis not present

## 2020-10-29 DIAGNOSIS — S72002D Fracture of unspecified part of neck of left femur, subsequent encounter for closed fracture with routine healing: Secondary | ICD-10-CM | POA: Diagnosis not present

## 2020-10-29 DIAGNOSIS — S32010D Wedge compression fracture of first lumbar vertebra, subsequent encounter for fracture with routine healing: Secondary | ICD-10-CM | POA: Diagnosis not present

## 2020-10-29 DIAGNOSIS — S2232XD Fracture of one rib, left side, subsequent encounter for fracture with routine healing: Secondary | ICD-10-CM | POA: Diagnosis not present

## 2020-10-29 DIAGNOSIS — E7849 Other hyperlipidemia: Secondary | ICD-10-CM | POA: Diagnosis not present

## 2020-10-29 DIAGNOSIS — I10 Essential (primary) hypertension: Secondary | ICD-10-CM | POA: Diagnosis not present

## 2020-11-04 DIAGNOSIS — G20 Parkinson's disease: Secondary | ICD-10-CM | POA: Diagnosis not present

## 2020-11-04 DIAGNOSIS — K5909 Other constipation: Secondary | ICD-10-CM | POA: Diagnosis not present

## 2020-11-04 DIAGNOSIS — K219 Gastro-esophageal reflux disease without esophagitis: Secondary | ICD-10-CM | POA: Diagnosis not present

## 2020-11-16 DIAGNOSIS — R269 Unspecified abnormalities of gait and mobility: Secondary | ICD-10-CM | POA: Diagnosis not present

## 2020-11-16 DIAGNOSIS — M6282 Rhabdomyolysis: Secondary | ICD-10-CM | POA: Diagnosis not present

## 2020-11-16 DIAGNOSIS — E43 Unspecified severe protein-calorie malnutrition: Secondary | ICD-10-CM | POA: Diagnosis not present

## 2020-11-16 DIAGNOSIS — I639 Cerebral infarction, unspecified: Secondary | ICD-10-CM | POA: Diagnosis not present

## 2020-11-16 DIAGNOSIS — G20 Parkinson's disease: Secondary | ICD-10-CM | POA: Diagnosis not present

## 2020-11-19 DIAGNOSIS — R296 Repeated falls: Secondary | ICD-10-CM | POA: Diagnosis not present

## 2020-11-19 DIAGNOSIS — K5909 Other constipation: Secondary | ICD-10-CM | POA: Diagnosis not present

## 2020-11-19 DIAGNOSIS — G20 Parkinson's disease: Secondary | ICD-10-CM | POA: Diagnosis not present

## 2020-11-20 DIAGNOSIS — G20 Parkinson's disease: Secondary | ICD-10-CM | POA: Diagnosis not present

## 2020-11-25 DIAGNOSIS — I10 Essential (primary) hypertension: Secondary | ICD-10-CM | POA: Diagnosis not present

## 2020-11-25 DIAGNOSIS — K219 Gastro-esophageal reflux disease without esophagitis: Secondary | ICD-10-CM | POA: Diagnosis not present

## 2020-11-25 DIAGNOSIS — U071 COVID-19: Secondary | ICD-10-CM | POA: Diagnosis not present

## 2020-11-25 DIAGNOSIS — G20 Parkinson's disease: Secondary | ICD-10-CM | POA: Diagnosis not present

## 2020-12-14 DIAGNOSIS — R269 Unspecified abnormalities of gait and mobility: Secondary | ICD-10-CM | POA: Diagnosis not present

## 2020-12-14 DIAGNOSIS — E43 Unspecified severe protein-calorie malnutrition: Secondary | ICD-10-CM | POA: Diagnosis not present

## 2020-12-14 DIAGNOSIS — M6282 Rhabdomyolysis: Secondary | ICD-10-CM | POA: Diagnosis not present

## 2020-12-14 DIAGNOSIS — G20 Parkinson's disease: Secondary | ICD-10-CM | POA: Diagnosis not present

## 2020-12-14 DIAGNOSIS — I639 Cerebral infarction, unspecified: Secondary | ICD-10-CM | POA: Diagnosis not present

## 2021-01-01 DIAGNOSIS — G20 Parkinson's disease: Secondary | ICD-10-CM | POA: Diagnosis not present

## 2021-01-01 DIAGNOSIS — E43 Unspecified severe protein-calorie malnutrition: Secondary | ICD-10-CM | POA: Diagnosis not present

## 2021-01-01 DIAGNOSIS — I10 Essential (primary) hypertension: Secondary | ICD-10-CM | POA: Diagnosis not present

## 2021-01-12 DIAGNOSIS — G20 Parkinson's disease: Secondary | ICD-10-CM | POA: Diagnosis not present

## 2021-01-12 DIAGNOSIS — E43 Unspecified severe protein-calorie malnutrition: Secondary | ICD-10-CM | POA: Diagnosis not present

## 2021-01-12 DIAGNOSIS — R269 Unspecified abnormalities of gait and mobility: Secondary | ICD-10-CM | POA: Diagnosis not present

## 2021-01-12 DIAGNOSIS — I639 Cerebral infarction, unspecified: Secondary | ICD-10-CM | POA: Diagnosis not present

## 2021-01-12 DIAGNOSIS — M6282 Rhabdomyolysis: Secondary | ICD-10-CM | POA: Diagnosis not present

## 2021-01-19 DIAGNOSIS — E43 Unspecified severe protein-calorie malnutrition: Secondary | ICD-10-CM | POA: Diagnosis not present

## 2021-01-19 DIAGNOSIS — E538 Deficiency of other specified B group vitamins: Secondary | ICD-10-CM | POA: Diagnosis not present

## 2021-01-19 DIAGNOSIS — I10 Essential (primary) hypertension: Secondary | ICD-10-CM | POA: Diagnosis not present

## 2021-01-19 DIAGNOSIS — E7849 Other hyperlipidemia: Secondary | ICD-10-CM | POA: Diagnosis not present

## 2021-01-22 DIAGNOSIS — I10 Essential (primary) hypertension: Secondary | ICD-10-CM | POA: Diagnosis not present

## 2021-01-22 DIAGNOSIS — E43 Unspecified severe protein-calorie malnutrition: Secondary | ICD-10-CM | POA: Diagnosis not present

## 2021-01-22 DIAGNOSIS — E7849 Other hyperlipidemia: Secondary | ICD-10-CM | POA: Diagnosis not present

## 2021-01-22 DIAGNOSIS — E538 Deficiency of other specified B group vitamins: Secondary | ICD-10-CM | POA: Diagnosis not present

## 2021-01-23 DIAGNOSIS — I10 Essential (primary) hypertension: Secondary | ICD-10-CM | POA: Diagnosis not present

## 2021-01-23 DIAGNOSIS — E43 Unspecified severe protein-calorie malnutrition: Secondary | ICD-10-CM | POA: Diagnosis not present

## 2021-01-23 DIAGNOSIS — E538 Deficiency of other specified B group vitamins: Secondary | ICD-10-CM | POA: Diagnosis not present

## 2021-01-23 DIAGNOSIS — E7849 Other hyperlipidemia: Secondary | ICD-10-CM | POA: Diagnosis not present

## 2021-01-24 DIAGNOSIS — E7849 Other hyperlipidemia: Secondary | ICD-10-CM | POA: Diagnosis not present

## 2021-01-24 DIAGNOSIS — E538 Deficiency of other specified B group vitamins: Secondary | ICD-10-CM | POA: Diagnosis not present

## 2021-01-24 DIAGNOSIS — G20 Parkinson's disease: Secondary | ICD-10-CM | POA: Diagnosis not present

## 2021-01-24 DIAGNOSIS — E43 Unspecified severe protein-calorie malnutrition: Secondary | ICD-10-CM | POA: Diagnosis not present

## 2021-01-24 DIAGNOSIS — I10 Essential (primary) hypertension: Secondary | ICD-10-CM | POA: Diagnosis not present

## 2021-01-25 DIAGNOSIS — E538 Deficiency of other specified B group vitamins: Secondary | ICD-10-CM | POA: Diagnosis not present

## 2021-01-25 DIAGNOSIS — G20 Parkinson's disease: Secondary | ICD-10-CM | POA: Diagnosis not present

## 2021-01-25 DIAGNOSIS — M6281 Muscle weakness (generalized): Secondary | ICD-10-CM | POA: Diagnosis not present

## 2021-01-25 DIAGNOSIS — Z8616 Personal history of COVID-19: Secondary | ICD-10-CM | POA: Diagnosis not present

## 2021-01-25 DIAGNOSIS — E43 Unspecified severe protein-calorie malnutrition: Secondary | ICD-10-CM | POA: Diagnosis not present

## 2021-01-25 DIAGNOSIS — E7849 Other hyperlipidemia: Secondary | ICD-10-CM | POA: Diagnosis not present

## 2021-01-25 DIAGNOSIS — I10 Essential (primary) hypertension: Secondary | ICD-10-CM | POA: Diagnosis not present

## 2021-01-26 DIAGNOSIS — E43 Unspecified severe protein-calorie malnutrition: Secondary | ICD-10-CM | POA: Diagnosis not present

## 2021-01-26 DIAGNOSIS — I10 Essential (primary) hypertension: Secondary | ICD-10-CM | POA: Diagnosis not present

## 2021-01-26 DIAGNOSIS — E538 Deficiency of other specified B group vitamins: Secondary | ICD-10-CM | POA: Diagnosis not present

## 2021-01-26 DIAGNOSIS — Z8616 Personal history of COVID-19: Secondary | ICD-10-CM | POA: Diagnosis not present

## 2021-01-26 DIAGNOSIS — E7849 Other hyperlipidemia: Secondary | ICD-10-CM | POA: Diagnosis not present

## 2021-01-26 DIAGNOSIS — M6281 Muscle weakness (generalized): Secondary | ICD-10-CM | POA: Diagnosis not present

## 2021-01-29 DIAGNOSIS — I10 Essential (primary) hypertension: Secondary | ICD-10-CM | POA: Diagnosis not present

## 2021-01-29 DIAGNOSIS — M6281 Muscle weakness (generalized): Secondary | ICD-10-CM | POA: Diagnosis not present

## 2021-01-29 DIAGNOSIS — Z8616 Personal history of COVID-19: Secondary | ICD-10-CM | POA: Diagnosis not present

## 2021-01-29 DIAGNOSIS — E43 Unspecified severe protein-calorie malnutrition: Secondary | ICD-10-CM | POA: Diagnosis not present

## 2021-01-29 DIAGNOSIS — E538 Deficiency of other specified B group vitamins: Secondary | ICD-10-CM | POA: Diagnosis not present

## 2021-01-29 DIAGNOSIS — E7849 Other hyperlipidemia: Secondary | ICD-10-CM | POA: Diagnosis not present

## 2021-01-31 DIAGNOSIS — E538 Deficiency of other specified B group vitamins: Secondary | ICD-10-CM | POA: Diagnosis not present

## 2021-01-31 DIAGNOSIS — M6281 Muscle weakness (generalized): Secondary | ICD-10-CM | POA: Diagnosis not present

## 2021-01-31 DIAGNOSIS — Z8616 Personal history of COVID-19: Secondary | ICD-10-CM | POA: Diagnosis not present

## 2021-01-31 DIAGNOSIS — E7849 Other hyperlipidemia: Secondary | ICD-10-CM | POA: Diagnosis not present

## 2021-01-31 DIAGNOSIS — I10 Essential (primary) hypertension: Secondary | ICD-10-CM | POA: Diagnosis not present

## 2021-01-31 DIAGNOSIS — E43 Unspecified severe protein-calorie malnutrition: Secondary | ICD-10-CM | POA: Diagnosis not present

## 2021-02-03 DIAGNOSIS — G20 Parkinson's disease: Secondary | ICD-10-CM | POA: Diagnosis not present

## 2021-02-03 DIAGNOSIS — K219 Gastro-esophageal reflux disease without esophagitis: Secondary | ICD-10-CM | POA: Diagnosis not present

## 2021-02-03 DIAGNOSIS — E43 Unspecified severe protein-calorie malnutrition: Secondary | ICD-10-CM | POA: Diagnosis not present

## 2021-02-08 DIAGNOSIS — M6281 Muscle weakness (generalized): Secondary | ICD-10-CM | POA: Diagnosis not present

## 2021-02-08 DIAGNOSIS — I639 Cerebral infarction, unspecified: Secondary | ICD-10-CM | POA: Diagnosis not present

## 2021-02-08 DIAGNOSIS — Z8616 Personal history of COVID-19: Secondary | ICD-10-CM | POA: Diagnosis not present

## 2021-02-08 DIAGNOSIS — G20 Parkinson's disease: Secondary | ICD-10-CM | POA: Diagnosis not present

## 2021-02-08 DIAGNOSIS — E538 Deficiency of other specified B group vitamins: Secondary | ICD-10-CM | POA: Diagnosis not present

## 2021-02-08 DIAGNOSIS — E7849 Other hyperlipidemia: Secondary | ICD-10-CM | POA: Diagnosis not present

## 2021-02-08 DIAGNOSIS — R269 Unspecified abnormalities of gait and mobility: Secondary | ICD-10-CM | POA: Diagnosis not present

## 2021-02-08 DIAGNOSIS — M6282 Rhabdomyolysis: Secondary | ICD-10-CM | POA: Diagnosis not present

## 2021-02-08 DIAGNOSIS — I10 Essential (primary) hypertension: Secondary | ICD-10-CM | POA: Diagnosis not present

## 2021-02-08 DIAGNOSIS — E43 Unspecified severe protein-calorie malnutrition: Secondary | ICD-10-CM | POA: Diagnosis not present

## 2021-02-12 DIAGNOSIS — G20 Parkinson's disease: Secondary | ICD-10-CM | POA: Diagnosis not present

## 2021-02-12 DIAGNOSIS — Z8616 Personal history of COVID-19: Secondary | ICD-10-CM | POA: Diagnosis not present

## 2021-02-12 DIAGNOSIS — E538 Deficiency of other specified B group vitamins: Secondary | ICD-10-CM | POA: Diagnosis not present

## 2021-02-12 DIAGNOSIS — E43 Unspecified severe protein-calorie malnutrition: Secondary | ICD-10-CM | POA: Diagnosis not present

## 2021-02-12 DIAGNOSIS — E7849 Other hyperlipidemia: Secondary | ICD-10-CM | POA: Diagnosis not present

## 2021-02-12 DIAGNOSIS — I10 Essential (primary) hypertension: Secondary | ICD-10-CM | POA: Diagnosis not present

## 2021-02-12 DIAGNOSIS — M6281 Muscle weakness (generalized): Secondary | ICD-10-CM | POA: Diagnosis not present

## 2021-02-13 DIAGNOSIS — M6281 Muscle weakness (generalized): Secondary | ICD-10-CM | POA: Diagnosis not present

## 2021-02-13 DIAGNOSIS — Z8616 Personal history of COVID-19: Secondary | ICD-10-CM | POA: Diagnosis not present

## 2021-02-13 DIAGNOSIS — E538 Deficiency of other specified B group vitamins: Secondary | ICD-10-CM | POA: Diagnosis not present

## 2021-02-13 DIAGNOSIS — E7849 Other hyperlipidemia: Secondary | ICD-10-CM | POA: Diagnosis not present

## 2021-02-13 DIAGNOSIS — E43 Unspecified severe protein-calorie malnutrition: Secondary | ICD-10-CM | POA: Diagnosis not present

## 2021-02-13 DIAGNOSIS — I10 Essential (primary) hypertension: Secondary | ICD-10-CM | POA: Diagnosis not present

## 2021-02-14 DIAGNOSIS — M6281 Muscle weakness (generalized): Secondary | ICD-10-CM | POA: Diagnosis not present

## 2021-02-14 DIAGNOSIS — Z8616 Personal history of COVID-19: Secondary | ICD-10-CM | POA: Diagnosis not present

## 2021-02-14 DIAGNOSIS — I10 Essential (primary) hypertension: Secondary | ICD-10-CM | POA: Diagnosis not present

## 2021-02-14 DIAGNOSIS — E7849 Other hyperlipidemia: Secondary | ICD-10-CM | POA: Diagnosis not present

## 2021-02-14 DIAGNOSIS — E43 Unspecified severe protein-calorie malnutrition: Secondary | ICD-10-CM | POA: Diagnosis not present

## 2021-02-14 DIAGNOSIS — E538 Deficiency of other specified B group vitamins: Secondary | ICD-10-CM | POA: Diagnosis not present

## 2021-02-18 DIAGNOSIS — M6281 Muscle weakness (generalized): Secondary | ICD-10-CM | POA: Diagnosis not present

## 2021-02-18 DIAGNOSIS — E43 Unspecified severe protein-calorie malnutrition: Secondary | ICD-10-CM | POA: Diagnosis not present

## 2021-02-18 DIAGNOSIS — I10 Essential (primary) hypertension: Secondary | ICD-10-CM | POA: Diagnosis not present

## 2021-02-18 DIAGNOSIS — E538 Deficiency of other specified B group vitamins: Secondary | ICD-10-CM | POA: Diagnosis not present

## 2021-02-18 DIAGNOSIS — E7849 Other hyperlipidemia: Secondary | ICD-10-CM | POA: Diagnosis not present

## 2021-02-18 DIAGNOSIS — Z8616 Personal history of COVID-19: Secondary | ICD-10-CM | POA: Diagnosis not present

## 2021-02-20 DIAGNOSIS — M6281 Muscle weakness (generalized): Secondary | ICD-10-CM | POA: Diagnosis not present

## 2021-02-20 DIAGNOSIS — E538 Deficiency of other specified B group vitamins: Secondary | ICD-10-CM | POA: Diagnosis not present

## 2021-02-20 DIAGNOSIS — E43 Unspecified severe protein-calorie malnutrition: Secondary | ICD-10-CM | POA: Diagnosis not present

## 2021-02-20 DIAGNOSIS — E7849 Other hyperlipidemia: Secondary | ICD-10-CM | POA: Diagnosis not present

## 2021-02-20 DIAGNOSIS — Z8616 Personal history of COVID-19: Secondary | ICD-10-CM | POA: Diagnosis not present

## 2021-02-20 DIAGNOSIS — I10 Essential (primary) hypertension: Secondary | ICD-10-CM | POA: Diagnosis not present

## 2021-02-22 DIAGNOSIS — I10 Essential (primary) hypertension: Secondary | ICD-10-CM | POA: Diagnosis not present

## 2021-02-22 DIAGNOSIS — Z8616 Personal history of COVID-19: Secondary | ICD-10-CM | POA: Diagnosis not present

## 2021-02-22 DIAGNOSIS — E43 Unspecified severe protein-calorie malnutrition: Secondary | ICD-10-CM | POA: Diagnosis not present

## 2021-02-22 DIAGNOSIS — E538 Deficiency of other specified B group vitamins: Secondary | ICD-10-CM | POA: Diagnosis not present

## 2021-02-22 DIAGNOSIS — E7849 Other hyperlipidemia: Secondary | ICD-10-CM | POA: Diagnosis not present

## 2021-02-22 DIAGNOSIS — M6281 Muscle weakness (generalized): Secondary | ICD-10-CM | POA: Diagnosis not present

## 2021-02-27 DIAGNOSIS — E538 Deficiency of other specified B group vitamins: Secondary | ICD-10-CM | POA: Diagnosis not present

## 2021-02-27 DIAGNOSIS — E7849 Other hyperlipidemia: Secondary | ICD-10-CM | POA: Diagnosis not present

## 2021-02-27 DIAGNOSIS — Z8616 Personal history of COVID-19: Secondary | ICD-10-CM | POA: Diagnosis not present

## 2021-02-27 DIAGNOSIS — I10 Essential (primary) hypertension: Secondary | ICD-10-CM | POA: Diagnosis not present

## 2021-02-27 DIAGNOSIS — M6281 Muscle weakness (generalized): Secondary | ICD-10-CM | POA: Diagnosis not present

## 2021-02-27 DIAGNOSIS — E43 Unspecified severe protein-calorie malnutrition: Secondary | ICD-10-CM | POA: Diagnosis not present

## 2021-03-04 DIAGNOSIS — E7849 Other hyperlipidemia: Secondary | ICD-10-CM | POA: Diagnosis not present

## 2021-03-04 DIAGNOSIS — Z8616 Personal history of COVID-19: Secondary | ICD-10-CM | POA: Diagnosis not present

## 2021-03-04 DIAGNOSIS — E43 Unspecified severe protein-calorie malnutrition: Secondary | ICD-10-CM | POA: Diagnosis not present

## 2021-03-04 DIAGNOSIS — M6281 Muscle weakness (generalized): Secondary | ICD-10-CM | POA: Diagnosis not present

## 2021-03-04 DIAGNOSIS — I10 Essential (primary) hypertension: Secondary | ICD-10-CM | POA: Diagnosis not present

## 2021-03-04 DIAGNOSIS — E538 Deficiency of other specified B group vitamins: Secondary | ICD-10-CM | POA: Diagnosis not present

## 2021-03-06 DIAGNOSIS — M6281 Muscle weakness (generalized): Secondary | ICD-10-CM | POA: Diagnosis not present

## 2021-03-06 DIAGNOSIS — E43 Unspecified severe protein-calorie malnutrition: Secondary | ICD-10-CM | POA: Diagnosis not present

## 2021-03-06 DIAGNOSIS — E7849 Other hyperlipidemia: Secondary | ICD-10-CM | POA: Diagnosis not present

## 2021-03-06 DIAGNOSIS — Z8616 Personal history of COVID-19: Secondary | ICD-10-CM | POA: Diagnosis not present

## 2021-03-06 DIAGNOSIS — I10 Essential (primary) hypertension: Secondary | ICD-10-CM | POA: Diagnosis not present

## 2021-03-06 DIAGNOSIS — E538 Deficiency of other specified B group vitamins: Secondary | ICD-10-CM | POA: Diagnosis not present

## 2021-03-07 DIAGNOSIS — E43 Unspecified severe protein-calorie malnutrition: Secondary | ICD-10-CM | POA: Diagnosis not present

## 2021-03-07 DIAGNOSIS — E538 Deficiency of other specified B group vitamins: Secondary | ICD-10-CM | POA: Diagnosis not present

## 2021-03-07 DIAGNOSIS — Z8616 Personal history of COVID-19: Secondary | ICD-10-CM | POA: Diagnosis not present

## 2021-03-07 DIAGNOSIS — E7849 Other hyperlipidemia: Secondary | ICD-10-CM | POA: Diagnosis not present

## 2021-03-07 DIAGNOSIS — M6281 Muscle weakness (generalized): Secondary | ICD-10-CM | POA: Diagnosis not present

## 2021-03-07 DIAGNOSIS — I10 Essential (primary) hypertension: Secondary | ICD-10-CM | POA: Diagnosis not present

## 2021-03-08 DIAGNOSIS — I10 Essential (primary) hypertension: Secondary | ICD-10-CM | POA: Diagnosis not present

## 2021-03-08 DIAGNOSIS — E7849 Other hyperlipidemia: Secondary | ICD-10-CM | POA: Diagnosis not present

## 2021-03-08 DIAGNOSIS — Z8616 Personal history of COVID-19: Secondary | ICD-10-CM | POA: Diagnosis not present

## 2021-03-08 DIAGNOSIS — M6281 Muscle weakness (generalized): Secondary | ICD-10-CM | POA: Diagnosis not present

## 2021-03-08 DIAGNOSIS — E43 Unspecified severe protein-calorie malnutrition: Secondary | ICD-10-CM | POA: Diagnosis not present

## 2021-03-08 DIAGNOSIS — E538 Deficiency of other specified B group vitamins: Secondary | ICD-10-CM | POA: Diagnosis not present

## 2021-03-11 DIAGNOSIS — M6281 Muscle weakness (generalized): Secondary | ICD-10-CM | POA: Diagnosis not present

## 2021-03-11 DIAGNOSIS — I10 Essential (primary) hypertension: Secondary | ICD-10-CM | POA: Diagnosis not present

## 2021-03-11 DIAGNOSIS — E538 Deficiency of other specified B group vitamins: Secondary | ICD-10-CM | POA: Diagnosis not present

## 2021-03-11 DIAGNOSIS — E7849 Other hyperlipidemia: Secondary | ICD-10-CM | POA: Diagnosis not present

## 2021-03-11 DIAGNOSIS — E43 Unspecified severe protein-calorie malnutrition: Secondary | ICD-10-CM | POA: Diagnosis not present

## 2021-03-11 DIAGNOSIS — Z8616 Personal history of COVID-19: Secondary | ICD-10-CM | POA: Diagnosis not present

## 2021-03-13 DIAGNOSIS — E43 Unspecified severe protein-calorie malnutrition: Secondary | ICD-10-CM | POA: Diagnosis not present

## 2021-03-13 DIAGNOSIS — I10 Essential (primary) hypertension: Secondary | ICD-10-CM | POA: Diagnosis not present

## 2021-03-13 DIAGNOSIS — Z8616 Personal history of COVID-19: Secondary | ICD-10-CM | POA: Diagnosis not present

## 2021-03-13 DIAGNOSIS — E538 Deficiency of other specified B group vitamins: Secondary | ICD-10-CM | POA: Diagnosis not present

## 2021-03-13 DIAGNOSIS — E7849 Other hyperlipidemia: Secondary | ICD-10-CM | POA: Diagnosis not present

## 2021-03-13 DIAGNOSIS — M6281 Muscle weakness (generalized): Secondary | ICD-10-CM | POA: Diagnosis not present

## 2021-03-15 DIAGNOSIS — G20 Parkinson's disease: Secondary | ICD-10-CM | POA: Diagnosis not present

## 2021-03-15 DIAGNOSIS — M6282 Rhabdomyolysis: Secondary | ICD-10-CM | POA: Diagnosis not present

## 2021-03-15 DIAGNOSIS — E43 Unspecified severe protein-calorie malnutrition: Secondary | ICD-10-CM | POA: Diagnosis not present

## 2021-03-15 DIAGNOSIS — I639 Cerebral infarction, unspecified: Secondary | ICD-10-CM | POA: Diagnosis not present

## 2021-03-15 DIAGNOSIS — R269 Unspecified abnormalities of gait and mobility: Secondary | ICD-10-CM | POA: Diagnosis not present

## 2021-03-15 DIAGNOSIS — I10 Essential (primary) hypertension: Secondary | ICD-10-CM | POA: Diagnosis not present

## 2021-03-18 DIAGNOSIS — I10 Essential (primary) hypertension: Secondary | ICD-10-CM | POA: Diagnosis not present

## 2021-03-18 DIAGNOSIS — Z8616 Personal history of COVID-19: Secondary | ICD-10-CM | POA: Diagnosis not present

## 2021-03-18 DIAGNOSIS — E43 Unspecified severe protein-calorie malnutrition: Secondary | ICD-10-CM | POA: Diagnosis not present

## 2021-03-18 DIAGNOSIS — E7849 Other hyperlipidemia: Secondary | ICD-10-CM | POA: Diagnosis not present

## 2021-03-18 DIAGNOSIS — E538 Deficiency of other specified B group vitamins: Secondary | ICD-10-CM | POA: Diagnosis not present

## 2021-03-18 DIAGNOSIS — M6281 Muscle weakness (generalized): Secondary | ICD-10-CM | POA: Diagnosis not present

## 2021-03-22 DIAGNOSIS — E538 Deficiency of other specified B group vitamins: Secondary | ICD-10-CM | POA: Diagnosis not present

## 2021-03-22 DIAGNOSIS — M6281 Muscle weakness (generalized): Secondary | ICD-10-CM | POA: Diagnosis not present

## 2021-03-22 DIAGNOSIS — I10 Essential (primary) hypertension: Secondary | ICD-10-CM | POA: Diagnosis not present

## 2021-03-22 DIAGNOSIS — Z8616 Personal history of COVID-19: Secondary | ICD-10-CM | POA: Diagnosis not present

## 2021-03-22 DIAGNOSIS — E43 Unspecified severe protein-calorie malnutrition: Secondary | ICD-10-CM | POA: Diagnosis not present

## 2021-03-22 DIAGNOSIS — E7849 Other hyperlipidemia: Secondary | ICD-10-CM | POA: Diagnosis not present

## 2021-03-24 DIAGNOSIS — I10 Essential (primary) hypertension: Secondary | ICD-10-CM | POA: Diagnosis not present

## 2021-03-24 DIAGNOSIS — G20 Parkinson's disease: Secondary | ICD-10-CM | POA: Diagnosis not present

## 2021-03-24 DIAGNOSIS — K219 Gastro-esophageal reflux disease without esophagitis: Secondary | ICD-10-CM | POA: Diagnosis not present

## 2021-03-25 DIAGNOSIS — G20 Parkinson's disease: Secondary | ICD-10-CM | POA: Diagnosis not present

## 2021-03-26 DIAGNOSIS — E7849 Other hyperlipidemia: Secondary | ICD-10-CM | POA: Diagnosis not present

## 2021-03-26 DIAGNOSIS — M6281 Muscle weakness (generalized): Secondary | ICD-10-CM | POA: Diagnosis not present

## 2021-03-26 DIAGNOSIS — E538 Deficiency of other specified B group vitamins: Secondary | ICD-10-CM | POA: Diagnosis not present

## 2021-03-26 DIAGNOSIS — I10 Essential (primary) hypertension: Secondary | ICD-10-CM | POA: Diagnosis not present

## 2021-03-26 DIAGNOSIS — Z8616 Personal history of COVID-19: Secondary | ICD-10-CM | POA: Diagnosis not present

## 2021-03-26 DIAGNOSIS — E43 Unspecified severe protein-calorie malnutrition: Secondary | ICD-10-CM | POA: Diagnosis not present

## 2021-03-28 DIAGNOSIS — E43 Unspecified severe protein-calorie malnutrition: Secondary | ICD-10-CM | POA: Diagnosis not present

## 2021-03-28 DIAGNOSIS — E538 Deficiency of other specified B group vitamins: Secondary | ICD-10-CM | POA: Diagnosis not present

## 2021-03-28 DIAGNOSIS — M6281 Muscle weakness (generalized): Secondary | ICD-10-CM | POA: Diagnosis not present

## 2021-03-28 DIAGNOSIS — I10 Essential (primary) hypertension: Secondary | ICD-10-CM | POA: Diagnosis not present

## 2021-03-28 DIAGNOSIS — G20 Parkinson's disease: Secondary | ICD-10-CM | POA: Diagnosis not present

## 2021-03-28 DIAGNOSIS — Z8616 Personal history of COVID-19: Secondary | ICD-10-CM | POA: Diagnosis not present

## 2021-03-28 DIAGNOSIS — E7849 Other hyperlipidemia: Secondary | ICD-10-CM | POA: Diagnosis not present

## 2021-03-29 DIAGNOSIS — M6281 Muscle weakness (generalized): Secondary | ICD-10-CM | POA: Diagnosis not present

## 2021-03-29 DIAGNOSIS — I10 Essential (primary) hypertension: Secondary | ICD-10-CM | POA: Diagnosis not present

## 2021-03-29 DIAGNOSIS — G20 Parkinson's disease: Secondary | ICD-10-CM | POA: Diagnosis not present

## 2021-03-29 DIAGNOSIS — E7849 Other hyperlipidemia: Secondary | ICD-10-CM | POA: Diagnosis not present

## 2021-03-29 DIAGNOSIS — Z8616 Personal history of COVID-19: Secondary | ICD-10-CM | POA: Diagnosis not present

## 2021-03-29 DIAGNOSIS — E538 Deficiency of other specified B group vitamins: Secondary | ICD-10-CM | POA: Diagnosis not present

## 2021-03-29 DIAGNOSIS — E43 Unspecified severe protein-calorie malnutrition: Secondary | ICD-10-CM | POA: Diagnosis not present

## 2021-04-01 DIAGNOSIS — E43 Unspecified severe protein-calorie malnutrition: Secondary | ICD-10-CM | POA: Diagnosis not present

## 2021-04-01 DIAGNOSIS — M6281 Muscle weakness (generalized): Secondary | ICD-10-CM | POA: Diagnosis not present

## 2021-04-01 DIAGNOSIS — G20 Parkinson's disease: Secondary | ICD-10-CM | POA: Diagnosis not present

## 2021-04-01 DIAGNOSIS — I10 Essential (primary) hypertension: Secondary | ICD-10-CM | POA: Diagnosis not present

## 2021-04-01 DIAGNOSIS — Z8616 Personal history of COVID-19: Secondary | ICD-10-CM | POA: Diagnosis not present

## 2021-04-01 DIAGNOSIS — E538 Deficiency of other specified B group vitamins: Secondary | ICD-10-CM | POA: Diagnosis not present

## 2021-04-01 DIAGNOSIS — E7849 Other hyperlipidemia: Secondary | ICD-10-CM | POA: Diagnosis not present

## 2021-04-01 DIAGNOSIS — M545 Low back pain, unspecified: Secondary | ICD-10-CM | POA: Diagnosis not present

## 2021-04-03 DIAGNOSIS — E43 Unspecified severe protein-calorie malnutrition: Secondary | ICD-10-CM | POA: Diagnosis not present

## 2021-04-03 DIAGNOSIS — E538 Deficiency of other specified B group vitamins: Secondary | ICD-10-CM | POA: Diagnosis not present

## 2021-04-03 DIAGNOSIS — M6281 Muscle weakness (generalized): Secondary | ICD-10-CM | POA: Diagnosis not present

## 2021-04-03 DIAGNOSIS — I10 Essential (primary) hypertension: Secondary | ICD-10-CM | POA: Diagnosis not present

## 2021-04-03 DIAGNOSIS — Z8616 Personal history of COVID-19: Secondary | ICD-10-CM | POA: Diagnosis not present

## 2021-04-03 DIAGNOSIS — G20 Parkinson's disease: Secondary | ICD-10-CM | POA: Diagnosis not present

## 2021-04-03 DIAGNOSIS — E7849 Other hyperlipidemia: Secondary | ICD-10-CM | POA: Diagnosis not present

## 2021-04-05 DIAGNOSIS — Z8616 Personal history of COVID-19: Secondary | ICD-10-CM | POA: Diagnosis not present

## 2021-04-05 DIAGNOSIS — E538 Deficiency of other specified B group vitamins: Secondary | ICD-10-CM | POA: Diagnosis not present

## 2021-04-05 DIAGNOSIS — E7849 Other hyperlipidemia: Secondary | ICD-10-CM | POA: Diagnosis not present

## 2021-04-05 DIAGNOSIS — G20 Parkinson's disease: Secondary | ICD-10-CM | POA: Diagnosis not present

## 2021-04-05 DIAGNOSIS — E43 Unspecified severe protein-calorie malnutrition: Secondary | ICD-10-CM | POA: Diagnosis not present

## 2021-04-05 DIAGNOSIS — M6281 Muscle weakness (generalized): Secondary | ICD-10-CM | POA: Diagnosis not present

## 2021-04-05 DIAGNOSIS — I10 Essential (primary) hypertension: Secondary | ICD-10-CM | POA: Diagnosis not present

## 2021-04-08 DIAGNOSIS — M6281 Muscle weakness (generalized): Secondary | ICD-10-CM | POA: Diagnosis not present

## 2021-04-08 DIAGNOSIS — E43 Unspecified severe protein-calorie malnutrition: Secondary | ICD-10-CM | POA: Diagnosis not present

## 2021-04-08 DIAGNOSIS — Z8616 Personal history of COVID-19: Secondary | ICD-10-CM | POA: Diagnosis not present

## 2021-04-08 DIAGNOSIS — I10 Essential (primary) hypertension: Secondary | ICD-10-CM | POA: Diagnosis not present

## 2021-04-08 DIAGNOSIS — E538 Deficiency of other specified B group vitamins: Secondary | ICD-10-CM | POA: Diagnosis not present

## 2021-04-08 DIAGNOSIS — E7849 Other hyperlipidemia: Secondary | ICD-10-CM | POA: Diagnosis not present

## 2021-04-08 DIAGNOSIS — G20 Parkinson's disease: Secondary | ICD-10-CM | POA: Diagnosis not present

## 2021-04-09 DIAGNOSIS — Z8616 Personal history of COVID-19: Secondary | ICD-10-CM | POA: Diagnosis not present

## 2021-04-09 DIAGNOSIS — E7849 Other hyperlipidemia: Secondary | ICD-10-CM | POA: Diagnosis not present

## 2021-04-09 DIAGNOSIS — G20 Parkinson's disease: Secondary | ICD-10-CM | POA: Diagnosis not present

## 2021-04-09 DIAGNOSIS — E538 Deficiency of other specified B group vitamins: Secondary | ICD-10-CM | POA: Diagnosis not present

## 2021-04-09 DIAGNOSIS — I10 Essential (primary) hypertension: Secondary | ICD-10-CM | POA: Diagnosis not present

## 2021-04-09 DIAGNOSIS — E43 Unspecified severe protein-calorie malnutrition: Secondary | ICD-10-CM | POA: Diagnosis not present

## 2021-04-09 DIAGNOSIS — M6281 Muscle weakness (generalized): Secondary | ICD-10-CM | POA: Diagnosis not present

## 2021-04-10 DIAGNOSIS — G20 Parkinson's disease: Secondary | ICD-10-CM | POA: Diagnosis not present

## 2021-04-10 DIAGNOSIS — M6281 Muscle weakness (generalized): Secondary | ICD-10-CM | POA: Diagnosis not present

## 2021-04-10 DIAGNOSIS — E538 Deficiency of other specified B group vitamins: Secondary | ICD-10-CM | POA: Diagnosis not present

## 2021-04-10 DIAGNOSIS — E7849 Other hyperlipidemia: Secondary | ICD-10-CM | POA: Diagnosis not present

## 2021-04-10 DIAGNOSIS — Z8616 Personal history of COVID-19: Secondary | ICD-10-CM | POA: Diagnosis not present

## 2021-04-10 DIAGNOSIS — I10 Essential (primary) hypertension: Secondary | ICD-10-CM | POA: Diagnosis not present

## 2021-04-10 DIAGNOSIS — E43 Unspecified severe protein-calorie malnutrition: Secondary | ICD-10-CM | POA: Diagnosis not present

## 2021-04-11 ENCOUNTER — Ambulatory Visit (INDEPENDENT_AMBULATORY_CARE_PROVIDER_SITE_OTHER): Payer: Medicare Other | Admitting: Neurology

## 2021-04-11 ENCOUNTER — Other Ambulatory Visit: Payer: Self-pay

## 2021-04-11 ENCOUNTER — Encounter: Payer: Self-pay | Admitting: Neurology

## 2021-04-11 VITALS — BP 132/82 | HR 76

## 2021-04-11 DIAGNOSIS — G20 Parkinson's disease: Secondary | ICD-10-CM

## 2021-04-11 DIAGNOSIS — R443 Hallucinations, unspecified: Secondary | ICD-10-CM | POA: Diagnosis not present

## 2021-04-11 DIAGNOSIS — R413 Other amnesia: Secondary | ICD-10-CM | POA: Diagnosis not present

## 2021-04-11 NOTE — Patient Instructions (Addendum)
It was nice to meet you today.  For your Parkinson's disease I recommend that you continue with the carbidopa-levodopa 25/100 mg strength 1 pill 3 times daily as prescribed currently.  I would not recommend increasing this medication at this time because of your history of hallucinations and it can cause dizziness, sleepiness, involuntary movements and worsening hallucinations.  I would like for you to continue to work with your therapist at [facility].  Please follow-up routinely to see one of our nurse practitioners in 6 months.
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
## 2021-04-12 DIAGNOSIS — G20 Parkinson's disease: Secondary | ICD-10-CM | POA: Diagnosis not present

## 2021-04-12 DIAGNOSIS — M6281 Muscle weakness (generalized): Secondary | ICD-10-CM | POA: Diagnosis not present

## 2021-04-12 DIAGNOSIS — E7849 Other hyperlipidemia: Secondary | ICD-10-CM | POA: Diagnosis not present

## 2021-04-12 DIAGNOSIS — Z8616 Personal history of COVID-19: Secondary | ICD-10-CM | POA: Diagnosis not present

## 2021-04-12 DIAGNOSIS — E538 Deficiency of other specified B group vitamins: Secondary | ICD-10-CM | POA: Diagnosis not present

## 2021-04-12 DIAGNOSIS — E43 Unspecified severe protein-calorie malnutrition: Secondary | ICD-10-CM | POA: Diagnosis not present

## 2021-04-12 DIAGNOSIS — I10 Essential (primary) hypertension: Secondary | ICD-10-CM | POA: Diagnosis not present

## 2021-04-14 DIAGNOSIS — Z8616 Personal history of COVID-19: Secondary | ICD-10-CM | POA: Diagnosis not present

## 2021-04-14 DIAGNOSIS — I10 Essential (primary) hypertension: Secondary | ICD-10-CM | POA: Diagnosis not present

## 2021-04-14 DIAGNOSIS — G20 Parkinson's disease: Secondary | ICD-10-CM | POA: Diagnosis not present

## 2021-04-14 DIAGNOSIS — E538 Deficiency of other specified B group vitamins: Secondary | ICD-10-CM | POA: Diagnosis not present

## 2021-04-14 DIAGNOSIS — M6281 Muscle weakness (generalized): Secondary | ICD-10-CM | POA: Diagnosis not present

## 2021-04-14 DIAGNOSIS — E43 Unspecified severe protein-calorie malnutrition: Secondary | ICD-10-CM | POA: Diagnosis not present

## 2021-04-14 DIAGNOSIS — E7849 Other hyperlipidemia: Secondary | ICD-10-CM | POA: Diagnosis not present

## 2021-04-15 DIAGNOSIS — E538 Deficiency of other specified B group vitamins: Secondary | ICD-10-CM | POA: Diagnosis not present

## 2021-04-15 DIAGNOSIS — Z8616 Personal history of COVID-19: Secondary | ICD-10-CM | POA: Diagnosis not present

## 2021-04-15 DIAGNOSIS — E7849 Other hyperlipidemia: Secondary | ICD-10-CM | POA: Diagnosis not present

## 2021-04-15 DIAGNOSIS — I10 Essential (primary) hypertension: Secondary | ICD-10-CM | POA: Diagnosis not present

## 2021-04-15 DIAGNOSIS — M6281 Muscle weakness (generalized): Secondary | ICD-10-CM | POA: Diagnosis not present

## 2021-04-15 DIAGNOSIS — E43 Unspecified severe protein-calorie malnutrition: Secondary | ICD-10-CM | POA: Diagnosis not present

## 2021-04-15 DIAGNOSIS — G20 Parkinson's disease: Secondary | ICD-10-CM | POA: Diagnosis not present

## 2021-04-16 DIAGNOSIS — E538 Deficiency of other specified B group vitamins: Secondary | ICD-10-CM | POA: Diagnosis not present

## 2021-04-16 DIAGNOSIS — E43 Unspecified severe protein-calorie malnutrition: Secondary | ICD-10-CM | POA: Diagnosis not present

## 2021-04-16 DIAGNOSIS — M6281 Muscle weakness (generalized): Secondary | ICD-10-CM | POA: Diagnosis not present

## 2021-04-16 DIAGNOSIS — G20 Parkinson's disease: Secondary | ICD-10-CM | POA: Diagnosis not present

## 2021-04-16 DIAGNOSIS — I10 Essential (primary) hypertension: Secondary | ICD-10-CM | POA: Diagnosis not present

## 2021-04-16 DIAGNOSIS — Z8616 Personal history of COVID-19: Secondary | ICD-10-CM | POA: Diagnosis not present

## 2021-04-16 DIAGNOSIS — E7849 Other hyperlipidemia: Secondary | ICD-10-CM | POA: Diagnosis not present

## 2021-04-17 DIAGNOSIS — Z8616 Personal history of COVID-19: Secondary | ICD-10-CM | POA: Diagnosis not present

## 2021-04-17 DIAGNOSIS — E43 Unspecified severe protein-calorie malnutrition: Secondary | ICD-10-CM | POA: Diagnosis not present

## 2021-04-17 DIAGNOSIS — I10 Essential (primary) hypertension: Secondary | ICD-10-CM | POA: Diagnosis not present

## 2021-04-17 DIAGNOSIS — E538 Deficiency of other specified B group vitamins: Secondary | ICD-10-CM | POA: Diagnosis not present

## 2021-04-17 DIAGNOSIS — G20 Parkinson's disease: Secondary | ICD-10-CM | POA: Diagnosis not present

## 2021-04-17 DIAGNOSIS — M6281 Muscle weakness (generalized): Secondary | ICD-10-CM | POA: Diagnosis not present

## 2021-04-17 DIAGNOSIS — E7849 Other hyperlipidemia: Secondary | ICD-10-CM | POA: Diagnosis not present

## 2021-04-18 DIAGNOSIS — Z8616 Personal history of COVID-19: Secondary | ICD-10-CM | POA: Diagnosis not present

## 2021-04-18 DIAGNOSIS — I10 Essential (primary) hypertension: Secondary | ICD-10-CM | POA: Diagnosis not present

## 2021-04-18 DIAGNOSIS — E538 Deficiency of other specified B group vitamins: Secondary | ICD-10-CM | POA: Diagnosis not present

## 2021-04-18 DIAGNOSIS — E7849 Other hyperlipidemia: Secondary | ICD-10-CM | POA: Diagnosis not present

## 2021-04-18 DIAGNOSIS — M6281 Muscle weakness (generalized): Secondary | ICD-10-CM | POA: Diagnosis not present

## 2021-04-18 DIAGNOSIS — E43 Unspecified severe protein-calorie malnutrition: Secondary | ICD-10-CM | POA: Diagnosis not present

## 2021-04-18 DIAGNOSIS — G20 Parkinson's disease: Secondary | ICD-10-CM | POA: Diagnosis not present

## 2021-04-19 DIAGNOSIS — I10 Essential (primary) hypertension: Secondary | ICD-10-CM | POA: Diagnosis not present

## 2021-04-19 DIAGNOSIS — G20 Parkinson's disease: Secondary | ICD-10-CM | POA: Diagnosis not present

## 2021-04-19 DIAGNOSIS — E538 Deficiency of other specified B group vitamins: Secondary | ICD-10-CM | POA: Diagnosis not present

## 2021-04-19 DIAGNOSIS — M6281 Muscle weakness (generalized): Secondary | ICD-10-CM | POA: Diagnosis not present

## 2021-04-19 DIAGNOSIS — Z8616 Personal history of COVID-19: Secondary | ICD-10-CM | POA: Diagnosis not present

## 2021-04-19 DIAGNOSIS — E43 Unspecified severe protein-calorie malnutrition: Secondary | ICD-10-CM | POA: Diagnosis not present

## 2021-04-19 DIAGNOSIS — E7849 Other hyperlipidemia: Secondary | ICD-10-CM | POA: Diagnosis not present

## 2021-04-22 DIAGNOSIS — I10 Essential (primary) hypertension: Secondary | ICD-10-CM | POA: Diagnosis not present

## 2021-04-22 DIAGNOSIS — E43 Unspecified severe protein-calorie malnutrition: Secondary | ICD-10-CM | POA: Diagnosis not present

## 2021-04-22 DIAGNOSIS — E538 Deficiency of other specified B group vitamins: Secondary | ICD-10-CM | POA: Diagnosis not present

## 2021-04-22 DIAGNOSIS — M6281 Muscle weakness (generalized): Secondary | ICD-10-CM | POA: Diagnosis not present

## 2021-04-22 DIAGNOSIS — G20 Parkinson's disease: Secondary | ICD-10-CM | POA: Diagnosis not present

## 2021-04-22 DIAGNOSIS — Z8616 Personal history of COVID-19: Secondary | ICD-10-CM | POA: Diagnosis not present

## 2021-04-22 DIAGNOSIS — E7849 Other hyperlipidemia: Secondary | ICD-10-CM | POA: Diagnosis not present

## 2021-04-23 DIAGNOSIS — M6281 Muscle weakness (generalized): Secondary | ICD-10-CM | POA: Diagnosis not present

## 2021-04-23 DIAGNOSIS — I10 Essential (primary) hypertension: Secondary | ICD-10-CM | POA: Diagnosis not present

## 2021-04-23 DIAGNOSIS — Z8616 Personal history of COVID-19: Secondary | ICD-10-CM | POA: Diagnosis not present

## 2021-04-23 DIAGNOSIS — E7849 Other hyperlipidemia: Secondary | ICD-10-CM | POA: Diagnosis not present

## 2021-04-23 DIAGNOSIS — E538 Deficiency of other specified B group vitamins: Secondary | ICD-10-CM | POA: Diagnosis not present

## 2021-04-23 DIAGNOSIS — G20 Parkinson's disease: Secondary | ICD-10-CM | POA: Diagnosis not present

## 2021-04-23 DIAGNOSIS — E43 Unspecified severe protein-calorie malnutrition: Secondary | ICD-10-CM | POA: Diagnosis not present

## 2021-04-24 DIAGNOSIS — R269 Unspecified abnormalities of gait and mobility: Secondary | ICD-10-CM | POA: Diagnosis not present

## 2021-04-24 DIAGNOSIS — G20 Parkinson's disease: Secondary | ICD-10-CM | POA: Diagnosis not present

## 2021-04-24 DIAGNOSIS — E43 Unspecified severe protein-calorie malnutrition: Secondary | ICD-10-CM | POA: Diagnosis not present

## 2021-04-24 DIAGNOSIS — E538 Deficiency of other specified B group vitamins: Secondary | ICD-10-CM | POA: Diagnosis not present

## 2021-04-24 DIAGNOSIS — I639 Cerebral infarction, unspecified: Secondary | ICD-10-CM | POA: Diagnosis not present

## 2021-04-24 DIAGNOSIS — Z8616 Personal history of COVID-19: Secondary | ICD-10-CM | POA: Diagnosis not present

## 2021-04-24 DIAGNOSIS — M6282 Rhabdomyolysis: Secondary | ICD-10-CM | POA: Diagnosis not present

## 2021-04-24 DIAGNOSIS — E7849 Other hyperlipidemia: Secondary | ICD-10-CM | POA: Diagnosis not present

## 2021-04-24 DIAGNOSIS — I10 Essential (primary) hypertension: Secondary | ICD-10-CM | POA: Diagnosis not present

## 2021-04-24 DIAGNOSIS — M6281 Muscle weakness (generalized): Secondary | ICD-10-CM | POA: Diagnosis not present

## 2021-04-25 DIAGNOSIS — E43 Unspecified severe protein-calorie malnutrition: Secondary | ICD-10-CM | POA: Diagnosis not present

## 2021-04-25 DIAGNOSIS — E7849 Other hyperlipidemia: Secondary | ICD-10-CM | POA: Diagnosis not present

## 2021-04-25 DIAGNOSIS — Z8616 Personal history of COVID-19: Secondary | ICD-10-CM | POA: Diagnosis not present

## 2021-04-25 DIAGNOSIS — M6281 Muscle weakness (generalized): Secondary | ICD-10-CM | POA: Diagnosis not present

## 2021-04-25 DIAGNOSIS — E538 Deficiency of other specified B group vitamins: Secondary | ICD-10-CM | POA: Diagnosis not present

## 2021-04-25 DIAGNOSIS — G20 Parkinson's disease: Secondary | ICD-10-CM | POA: Diagnosis not present

## 2021-04-25 DIAGNOSIS — I10 Essential (primary) hypertension: Secondary | ICD-10-CM | POA: Diagnosis not present

## 2021-04-26 DIAGNOSIS — E7849 Other hyperlipidemia: Secondary | ICD-10-CM | POA: Diagnosis not present

## 2021-04-26 DIAGNOSIS — E43 Unspecified severe protein-calorie malnutrition: Secondary | ICD-10-CM | POA: Diagnosis not present

## 2021-04-26 DIAGNOSIS — M6281 Muscle weakness (generalized): Secondary | ICD-10-CM | POA: Diagnosis not present

## 2021-04-26 DIAGNOSIS — Z8616 Personal history of COVID-19: Secondary | ICD-10-CM | POA: Diagnosis not present

## 2021-04-26 DIAGNOSIS — G20 Parkinson's disease: Secondary | ICD-10-CM | POA: Diagnosis not present

## 2021-04-26 DIAGNOSIS — I10 Essential (primary) hypertension: Secondary | ICD-10-CM | POA: Diagnosis not present

## 2021-04-26 DIAGNOSIS — E538 Deficiency of other specified B group vitamins: Secondary | ICD-10-CM | POA: Diagnosis not present

## 2021-04-29 DIAGNOSIS — E43 Unspecified severe protein-calorie malnutrition: Secondary | ICD-10-CM | POA: Diagnosis not present

## 2021-04-29 DIAGNOSIS — Z8616 Personal history of COVID-19: Secondary | ICD-10-CM | POA: Diagnosis not present

## 2021-04-29 DIAGNOSIS — M6281 Muscle weakness (generalized): Secondary | ICD-10-CM | POA: Diagnosis not present

## 2021-04-29 DIAGNOSIS — E7849 Other hyperlipidemia: Secondary | ICD-10-CM | POA: Diagnosis not present

## 2021-04-29 DIAGNOSIS — I10 Essential (primary) hypertension: Secondary | ICD-10-CM | POA: Diagnosis not present

## 2021-04-29 DIAGNOSIS — G20 Parkinson's disease: Secondary | ICD-10-CM | POA: Diagnosis not present

## 2021-04-29 DIAGNOSIS — E538 Deficiency of other specified B group vitamins: Secondary | ICD-10-CM | POA: Diagnosis not present

## 2021-04-30 DIAGNOSIS — E43 Unspecified severe protein-calorie malnutrition: Secondary | ICD-10-CM | POA: Diagnosis not present

## 2021-04-30 DIAGNOSIS — G20 Parkinson's disease: Secondary | ICD-10-CM | POA: Diagnosis not present

## 2021-04-30 DIAGNOSIS — M6281 Muscle weakness (generalized): Secondary | ICD-10-CM | POA: Diagnosis not present

## 2021-04-30 DIAGNOSIS — E538 Deficiency of other specified B group vitamins: Secondary | ICD-10-CM | POA: Diagnosis not present

## 2021-04-30 DIAGNOSIS — E7849 Other hyperlipidemia: Secondary | ICD-10-CM | POA: Diagnosis not present

## 2021-04-30 DIAGNOSIS — I10 Essential (primary) hypertension: Secondary | ICD-10-CM | POA: Diagnosis not present

## 2021-04-30 DIAGNOSIS — Z8616 Personal history of COVID-19: Secondary | ICD-10-CM | POA: Diagnosis not present

## 2021-05-01 DIAGNOSIS — E43 Unspecified severe protein-calorie malnutrition: Secondary | ICD-10-CM | POA: Diagnosis not present

## 2021-05-01 DIAGNOSIS — Z8616 Personal history of COVID-19: Secondary | ICD-10-CM | POA: Diagnosis not present

## 2021-05-01 DIAGNOSIS — E7849 Other hyperlipidemia: Secondary | ICD-10-CM | POA: Diagnosis not present

## 2021-05-01 DIAGNOSIS — G20 Parkinson's disease: Secondary | ICD-10-CM | POA: Diagnosis not present

## 2021-05-01 DIAGNOSIS — I10 Essential (primary) hypertension: Secondary | ICD-10-CM | POA: Diagnosis not present

## 2021-05-01 DIAGNOSIS — M6281 Muscle weakness (generalized): Secondary | ICD-10-CM | POA: Diagnosis not present

## 2021-05-01 DIAGNOSIS — E538 Deficiency of other specified B group vitamins: Secondary | ICD-10-CM | POA: Diagnosis not present

## 2021-05-02 DIAGNOSIS — E538 Deficiency of other specified B group vitamins: Secondary | ICD-10-CM | POA: Diagnosis not present

## 2021-05-02 DIAGNOSIS — E7849 Other hyperlipidemia: Secondary | ICD-10-CM | POA: Diagnosis not present

## 2021-05-02 DIAGNOSIS — I10 Essential (primary) hypertension: Secondary | ICD-10-CM | POA: Diagnosis not present

## 2021-05-02 DIAGNOSIS — Z8616 Personal history of COVID-19: Secondary | ICD-10-CM | POA: Diagnosis not present

## 2021-05-02 DIAGNOSIS — E43 Unspecified severe protein-calorie malnutrition: Secondary | ICD-10-CM | POA: Diagnosis not present

## 2021-05-02 DIAGNOSIS — G20 Parkinson's disease: Secondary | ICD-10-CM | POA: Diagnosis not present

## 2021-05-02 DIAGNOSIS — M6281 Muscle weakness (generalized): Secondary | ICD-10-CM | POA: Diagnosis not present

## 2021-05-03 DIAGNOSIS — E7849 Other hyperlipidemia: Secondary | ICD-10-CM | POA: Diagnosis not present

## 2021-05-03 DIAGNOSIS — E538 Deficiency of other specified B group vitamins: Secondary | ICD-10-CM | POA: Diagnosis not present

## 2021-05-03 DIAGNOSIS — E43 Unspecified severe protein-calorie malnutrition: Secondary | ICD-10-CM | POA: Diagnosis not present

## 2021-05-03 DIAGNOSIS — I10 Essential (primary) hypertension: Secondary | ICD-10-CM | POA: Diagnosis not present

## 2021-05-03 DIAGNOSIS — Z8616 Personal history of COVID-19: Secondary | ICD-10-CM | POA: Diagnosis not present

## 2021-05-03 DIAGNOSIS — G20 Parkinson's disease: Secondary | ICD-10-CM | POA: Diagnosis not present

## 2021-05-03 DIAGNOSIS — M6281 Muscle weakness (generalized): Secondary | ICD-10-CM | POA: Diagnosis not present

## 2021-05-06 DIAGNOSIS — I10 Essential (primary) hypertension: Secondary | ICD-10-CM | POA: Diagnosis not present

## 2021-05-06 DIAGNOSIS — Z8616 Personal history of COVID-19: Secondary | ICD-10-CM | POA: Diagnosis not present

## 2021-05-06 DIAGNOSIS — E43 Unspecified severe protein-calorie malnutrition: Secondary | ICD-10-CM | POA: Diagnosis not present

## 2021-05-06 DIAGNOSIS — G20 Parkinson's disease: Secondary | ICD-10-CM | POA: Diagnosis not present

## 2021-05-06 DIAGNOSIS — M6281 Muscle weakness (generalized): Secondary | ICD-10-CM | POA: Diagnosis not present

## 2021-05-06 DIAGNOSIS — E538 Deficiency of other specified B group vitamins: Secondary | ICD-10-CM | POA: Diagnosis not present

## 2021-05-06 DIAGNOSIS — E7849 Other hyperlipidemia: Secondary | ICD-10-CM | POA: Diagnosis not present

## 2021-05-08 DIAGNOSIS — I10 Essential (primary) hypertension: Secondary | ICD-10-CM | POA: Diagnosis not present

## 2021-05-08 DIAGNOSIS — G20 Parkinson's disease: Secondary | ICD-10-CM | POA: Diagnosis not present

## 2021-05-08 DIAGNOSIS — E538 Deficiency of other specified B group vitamins: Secondary | ICD-10-CM | POA: Diagnosis not present

## 2021-05-08 DIAGNOSIS — E43 Unspecified severe protein-calorie malnutrition: Secondary | ICD-10-CM | POA: Diagnosis not present

## 2021-05-08 DIAGNOSIS — E7849 Other hyperlipidemia: Secondary | ICD-10-CM | POA: Diagnosis not present

## 2021-05-08 DIAGNOSIS — Z8616 Personal history of COVID-19: Secondary | ICD-10-CM | POA: Diagnosis not present

## 2021-05-08 DIAGNOSIS — M6281 Muscle weakness (generalized): Secondary | ICD-10-CM | POA: Diagnosis not present

## 2021-05-09 DIAGNOSIS — E7849 Other hyperlipidemia: Secondary | ICD-10-CM | POA: Diagnosis not present

## 2021-05-09 DIAGNOSIS — G20 Parkinson's disease: Secondary | ICD-10-CM | POA: Diagnosis not present

## 2021-05-09 DIAGNOSIS — D649 Anemia, unspecified: Secondary | ICD-10-CM | POA: Diagnosis not present

## 2021-05-09 DIAGNOSIS — Z13228 Encounter for screening for other metabolic disorders: Secondary | ICD-10-CM | POA: Diagnosis not present

## 2021-05-09 DIAGNOSIS — E43 Unspecified severe protein-calorie malnutrition: Secondary | ICD-10-CM | POA: Diagnosis not present

## 2021-05-09 DIAGNOSIS — M6281 Muscle weakness (generalized): Secondary | ICD-10-CM | POA: Diagnosis not present

## 2021-05-09 DIAGNOSIS — I10 Essential (primary) hypertension: Secondary | ICD-10-CM | POA: Diagnosis not present

## 2021-05-09 DIAGNOSIS — Z8616 Personal history of COVID-19: Secondary | ICD-10-CM | POA: Diagnosis not present

## 2021-05-09 DIAGNOSIS — E538 Deficiency of other specified B group vitamins: Secondary | ICD-10-CM | POA: Diagnosis not present

## 2021-05-10 DIAGNOSIS — E7849 Other hyperlipidemia: Secondary | ICD-10-CM | POA: Diagnosis not present

## 2021-05-10 DIAGNOSIS — E538 Deficiency of other specified B group vitamins: Secondary | ICD-10-CM | POA: Diagnosis not present

## 2021-05-10 DIAGNOSIS — M6281 Muscle weakness (generalized): Secondary | ICD-10-CM | POA: Diagnosis not present

## 2021-05-10 DIAGNOSIS — I10 Essential (primary) hypertension: Secondary | ICD-10-CM | POA: Diagnosis not present

## 2021-05-10 DIAGNOSIS — G20 Parkinson's disease: Secondary | ICD-10-CM | POA: Diagnosis not present

## 2021-05-10 DIAGNOSIS — E43 Unspecified severe protein-calorie malnutrition: Secondary | ICD-10-CM | POA: Diagnosis not present

## 2021-05-10 DIAGNOSIS — Z8616 Personal history of COVID-19: Secondary | ICD-10-CM | POA: Diagnosis not present

## 2021-05-13 DIAGNOSIS — M6281 Muscle weakness (generalized): Secondary | ICD-10-CM | POA: Diagnosis not present

## 2021-05-13 DIAGNOSIS — E538 Deficiency of other specified B group vitamins: Secondary | ICD-10-CM | POA: Diagnosis not present

## 2021-05-13 DIAGNOSIS — Z8616 Personal history of COVID-19: Secondary | ICD-10-CM | POA: Diagnosis not present

## 2021-05-13 DIAGNOSIS — G20 Parkinson's disease: Secondary | ICD-10-CM | POA: Diagnosis not present

## 2021-05-13 DIAGNOSIS — E43 Unspecified severe protein-calorie malnutrition: Secondary | ICD-10-CM | POA: Diagnosis not present

## 2021-05-13 DIAGNOSIS — I10 Essential (primary) hypertension: Secondary | ICD-10-CM | POA: Diagnosis not present

## 2021-05-13 DIAGNOSIS — E7849 Other hyperlipidemia: Secondary | ICD-10-CM | POA: Diagnosis not present

## 2021-05-14 DIAGNOSIS — I10 Essential (primary) hypertension: Secondary | ICD-10-CM | POA: Diagnosis not present

## 2021-05-14 DIAGNOSIS — E7849 Other hyperlipidemia: Secondary | ICD-10-CM | POA: Diagnosis not present

## 2021-05-14 DIAGNOSIS — M6281 Muscle weakness (generalized): Secondary | ICD-10-CM | POA: Diagnosis not present

## 2021-05-14 DIAGNOSIS — E43 Unspecified severe protein-calorie malnutrition: Secondary | ICD-10-CM | POA: Diagnosis not present

## 2021-05-14 DIAGNOSIS — Z8616 Personal history of COVID-19: Secondary | ICD-10-CM | POA: Diagnosis not present

## 2021-05-14 DIAGNOSIS — E538 Deficiency of other specified B group vitamins: Secondary | ICD-10-CM | POA: Diagnosis not present

## 2021-05-14 DIAGNOSIS — G20 Parkinson's disease: Secondary | ICD-10-CM | POA: Diagnosis not present

## 2021-05-15 DIAGNOSIS — E43 Unspecified severe protein-calorie malnutrition: Secondary | ICD-10-CM | POA: Diagnosis not present

## 2021-05-15 DIAGNOSIS — I10 Essential (primary) hypertension: Secondary | ICD-10-CM | POA: Diagnosis not present

## 2021-05-15 DIAGNOSIS — E7849 Other hyperlipidemia: Secondary | ICD-10-CM | POA: Diagnosis not present

## 2021-05-15 DIAGNOSIS — G20 Parkinson's disease: Secondary | ICD-10-CM | POA: Diagnosis not present

## 2021-05-15 DIAGNOSIS — E538 Deficiency of other specified B group vitamins: Secondary | ICD-10-CM | POA: Diagnosis not present

## 2021-05-15 DIAGNOSIS — M6281 Muscle weakness (generalized): Secondary | ICD-10-CM | POA: Diagnosis not present

## 2021-05-15 DIAGNOSIS — Z8616 Personal history of COVID-19: Secondary | ICD-10-CM | POA: Diagnosis not present

## 2021-05-16 DIAGNOSIS — M6281 Muscle weakness (generalized): Secondary | ICD-10-CM | POA: Diagnosis not present

## 2021-05-16 DIAGNOSIS — I10 Essential (primary) hypertension: Secondary | ICD-10-CM | POA: Diagnosis not present

## 2021-05-16 DIAGNOSIS — E43 Unspecified severe protein-calorie malnutrition: Secondary | ICD-10-CM | POA: Diagnosis not present

## 2021-05-16 DIAGNOSIS — E538 Deficiency of other specified B group vitamins: Secondary | ICD-10-CM | POA: Diagnosis not present

## 2021-05-16 DIAGNOSIS — G20 Parkinson's disease: Secondary | ICD-10-CM | POA: Diagnosis not present

## 2021-05-16 DIAGNOSIS — E7849 Other hyperlipidemia: Secondary | ICD-10-CM | POA: Diagnosis not present

## 2021-05-16 DIAGNOSIS — Z8616 Personal history of COVID-19: Secondary | ICD-10-CM | POA: Diagnosis not present

## 2021-05-17 DIAGNOSIS — E43 Unspecified severe protein-calorie malnutrition: Secondary | ICD-10-CM | POA: Diagnosis not present

## 2021-05-17 DIAGNOSIS — E7849 Other hyperlipidemia: Secondary | ICD-10-CM | POA: Diagnosis not present

## 2021-05-17 DIAGNOSIS — R269 Unspecified abnormalities of gait and mobility: Secondary | ICD-10-CM | POA: Diagnosis not present

## 2021-05-17 DIAGNOSIS — I639 Cerebral infarction, unspecified: Secondary | ICD-10-CM | POA: Diagnosis not present

## 2021-05-17 DIAGNOSIS — Z8616 Personal history of COVID-19: Secondary | ICD-10-CM | POA: Diagnosis not present

## 2021-05-17 DIAGNOSIS — G20 Parkinson's disease: Secondary | ICD-10-CM | POA: Diagnosis not present

## 2021-05-17 DIAGNOSIS — M6282 Rhabdomyolysis: Secondary | ICD-10-CM | POA: Diagnosis not present

## 2021-05-17 DIAGNOSIS — M6281 Muscle weakness (generalized): Secondary | ICD-10-CM | POA: Diagnosis not present

## 2021-05-17 DIAGNOSIS — I10 Essential (primary) hypertension: Secondary | ICD-10-CM | POA: Diagnosis not present

## 2021-05-17 DIAGNOSIS — E538 Deficiency of other specified B group vitamins: Secondary | ICD-10-CM | POA: Diagnosis not present

## 2021-05-20 DIAGNOSIS — M6281 Muscle weakness (generalized): Secondary | ICD-10-CM | POA: Diagnosis not present

## 2021-05-20 DIAGNOSIS — E7849 Other hyperlipidemia: Secondary | ICD-10-CM | POA: Diagnosis not present

## 2021-05-20 DIAGNOSIS — Z8616 Personal history of COVID-19: Secondary | ICD-10-CM | POA: Diagnosis not present

## 2021-05-20 DIAGNOSIS — E538 Deficiency of other specified B group vitamins: Secondary | ICD-10-CM | POA: Diagnosis not present

## 2021-05-20 DIAGNOSIS — E43 Unspecified severe protein-calorie malnutrition: Secondary | ICD-10-CM | POA: Diagnosis not present

## 2021-05-20 DIAGNOSIS — G20 Parkinson's disease: Secondary | ICD-10-CM | POA: Diagnosis not present

## 2021-05-20 DIAGNOSIS — I10 Essential (primary) hypertension: Secondary | ICD-10-CM | POA: Diagnosis not present

## 2021-05-21 DIAGNOSIS — G20 Parkinson's disease: Secondary | ICD-10-CM | POA: Diagnosis not present

## 2021-05-22 ENCOUNTER — Emergency Department (HOSPITAL_COMMUNITY)
Admission: EM | Admit: 2021-05-22 | Discharge: 2021-05-23 | Disposition: A | Discharge status: Home / Self Care | Payer: Medicare Other | Attending: Emergency Medicine | Admitting: Emergency Medicine

## 2021-05-22 ENCOUNTER — Emergency Department (HOSPITAL_COMMUNITY): Payer: Medicare Other

## 2021-05-22 DIAGNOSIS — Z79899 Other long term (current) drug therapy: Secondary | ICD-10-CM | POA: Insufficient documentation

## 2021-05-22 DIAGNOSIS — Z043 Encounter for examination and observation following other accident: Secondary | ICD-10-CM | POA: Diagnosis not present

## 2021-05-22 DIAGNOSIS — Z87891 Personal history of nicotine dependence: Secondary | ICD-10-CM | POA: Insufficient documentation

## 2021-05-22 DIAGNOSIS — J80 Acute respiratory distress syndrome: Secondary | ICD-10-CM | POA: Diagnosis not present

## 2021-05-22 DIAGNOSIS — G20 Parkinson's disease: Secondary | ICD-10-CM | POA: Insufficient documentation

## 2021-05-22 DIAGNOSIS — R296 Repeated falls: Secondary | ICD-10-CM | POA: Diagnosis not present

## 2021-05-22 DIAGNOSIS — S0990XA Unspecified injury of head, initial encounter: Secondary | ICD-10-CM | POA: Diagnosis not present

## 2021-05-22 DIAGNOSIS — R600 Localized edema: Secondary | ICD-10-CM | POA: Insufficient documentation

## 2021-05-22 DIAGNOSIS — R0602 Shortness of breath: Secondary | ICD-10-CM | POA: Diagnosis not present

## 2021-05-22 DIAGNOSIS — Z743 Need for continuous supervision: Secondary | ICD-10-CM | POA: Diagnosis not present

## 2021-05-22 DIAGNOSIS — F0280 Dementia in other diseases classified elsewhere without behavioral disturbance: Secondary | ICD-10-CM | POA: Diagnosis not present

## 2021-05-22 DIAGNOSIS — E860 Dehydration: Secondary | ICD-10-CM

## 2021-05-22 DIAGNOSIS — I959 Hypotension, unspecified: Secondary | ICD-10-CM | POA: Insufficient documentation

## 2021-05-22 DIAGNOSIS — I213 ST elevation (STEMI) myocardial infarction of unspecified site: Secondary | ICD-10-CM | POA: Diagnosis not present

## 2021-05-22 DIAGNOSIS — I10 Essential (primary) hypertension: Secondary | ICD-10-CM | POA: Diagnosis not present

## 2021-05-22 DIAGNOSIS — R6889 Other general symptoms and signs: Secondary | ICD-10-CM | POA: Diagnosis not present

## 2021-05-22 DIAGNOSIS — R410 Disorientation, unspecified: Secondary | ICD-10-CM | POA: Diagnosis not present

## 2021-05-22 DIAGNOSIS — I499 Cardiac arrhythmia, unspecified: Secondary | ICD-10-CM | POA: Diagnosis not present

## 2021-05-22 LAB — CBC WITH DIFFERENTIAL/PLATELET
Abs Immature Granulocytes: 0.04 10*3/uL (ref 0.00–0.07)
Basophils Absolute: 0.1 10*3/uL (ref 0.0–0.1)
Basophils Relative: 1 %
Eosinophils Absolute: 0.1 10*3/uL (ref 0.0–0.5)
Eosinophils Relative: 1 %
HCT: 41.8 % (ref 39.0–52.0)
Hemoglobin: 13.3 g/dL (ref 13.0–17.0)
Immature Granulocytes: 0 %
Lymphocytes Relative: 10 %
Lymphs Abs: 0.9 10*3/uL (ref 0.7–4.0)
MCH: 30.7 pg (ref 26.0–34.0)
MCHC: 31.8 g/dL (ref 30.0–36.0)
MCV: 96.5 fL (ref 80.0–100.0)
Monocytes Absolute: 0.6 10*3/uL (ref 0.1–1.0)
Monocytes Relative: 7 %
Neutro Abs: 7.5 10*3/uL (ref 1.7–7.7)
Neutrophils Relative %: 81 %
Platelets: 166 10*3/uL (ref 150–400)
RBC: 4.33 MIL/uL (ref 4.22–5.81)
RDW: 15.6 % — ABNORMAL HIGH (ref 11.5–15.5)
WBC: 9.2 10*3/uL (ref 4.0–10.5)
nRBC: 0 % (ref 0.0–0.2)

## 2021-05-22 LAB — COMPREHENSIVE METABOLIC PANEL
ALT: <5 U/L (ref 0–44)
AST: 22 U/L (ref 15–41)
Albumin: 3.5 g/dL (ref 3.5–5.0)
Alkaline Phosphatase: 44 U/L (ref 38–126)
Anion gap: 6 (ref 5–15)
BUN: 24 mg/dL — ABNORMAL HIGH (ref 8–23)
CO2: 27 mmol/L (ref 22–32)
Calcium: 9 mg/dL (ref 8.9–10.3)
Chloride: 105 mmol/L (ref 98–111)
Creatinine, Ser: 0.86 mg/dL (ref 0.61–1.24)
GFR, Estimated: >60 mL/min (ref >60)
Glucose, Bld: 101 mg/dL — ABNORMAL HIGH (ref 70–99)
Potassium: 4 mmol/L (ref 3.5–5.1)
Sodium: 138 mmol/L (ref 135–145)
Total Bilirubin: 0.8 mg/dL (ref 0.3–1.2)
Total Protein: 6 g/dL — ABNORMAL LOW (ref 6.5–8.1)

## 2021-05-22 LAB — BRAIN NATRIURETIC PEPTIDE: B Natriuretic Peptide: 13.3 pg/mL (ref 0.0–100.0)

## 2021-05-22 LAB — TROPONIN I (HIGH SENSITIVITY): Troponin I (High Sensitivity): 7 ng/L (ref <18)

## 2021-05-22 LAB — LACTIC ACID, PLASMA: Lactic Acid, Venous: 2.2 mmol/L (ref 0.5–1.9)

## 2021-05-22 LAB — PROTIME-INR
INR: 1 (ref 0.8–1.2)
Prothrombin Time: 13.7 seconds (ref 11.4–15.2)

## 2021-05-22 MED ORDER — LORAZEPAM 2 MG/ML IJ SOLN
1.0000 mg | Freq: Once | INTRAMUSCULAR | Status: AC
  Administered 2021-05-22: 1 mg via INTRAMUSCULAR
  Filled 2021-05-22: qty 1

## 2021-05-22 MED ORDER — SODIUM CHLORIDE 0.9 % IV BOLUS
1000.0000 mL | Freq: Once | INTRAVENOUS | Status: AC
  Administered 2021-05-22: 1000 mL via INTRAVENOUS

## 2021-05-22 NOTE — ED Provider Notes (Signed)
  Physical Exam  BP (!) 147/89   Pulse (!) 198   Temp 97.8 F (36.6 C) (Axillary)   Resp 15   Ht 5\' 9"  (1.753 m)   Wt 58 kg   SpO2 95%   BMI 18.88 kg/m   Physical Exam  ED Course/Procedures     Procedures  MDM  Care assumed at 3 pm. Patient here with hypotension and given IVF by EMS.  Sign out pending labs and CT head.   5:49 PM Lactate 2.2 but WBC is nl. Afebrile.  CT head unremarkable.  Patient unfortunately pulled out his IV.  However his blood pressure remained stable in the ED.  Ordered second lactate patient was agitated and refused it.  At this point I do not think he is septic.  I think likely hypotension is from dehydration.  Since patient's blood pressure is normal now, I think he is stable to return back to facility.     , MD 05/22/21 [phone]

## 2021-05-22 NOTE — ED Notes (Signed)
Unable to do orthostatic vital signs. Pt was unable to stand or sit up on the edge of the bed.

## 2021-05-22 NOTE — ED Triage Notes (Signed)
BIB [facility] after staff at [facility] called to report that pt was slumped over in his wheelchair. According to staff, pt pale and "not himself". Staff took pt b/p and it was 80/40.  Hx: dementia, stroke
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
## 2021-05-22 NOTE — Discharge Instructions (Addendum)
Stay hydrated  Your labs and CT head are unremarkable.  See your doctor for follow-up  Return to ER if he has confusion, abdominal pain, passing out

## 2021-05-22 NOTE — ED Notes (Signed)
[facility] called, patient is 14th on the list

## 2021-05-23 DIAGNOSIS — G20 Parkinson's disease: Secondary | ICD-10-CM | POA: Diagnosis not present

## 2021-05-23 DIAGNOSIS — R404 Transient alteration of awareness: Secondary | ICD-10-CM | POA: Diagnosis not present

## 2021-05-23 DIAGNOSIS — Z743 Need for continuous supervision: Secondary | ICD-10-CM | POA: Diagnosis not present

## 2021-05-23 DIAGNOSIS — R296 Repeated falls: Secondary | ICD-10-CM | POA: Diagnosis not present

## 2021-05-23 DIAGNOSIS — R5381 Other malaise: Secondary | ICD-10-CM | POA: Diagnosis not present

## 2021-05-23 NOTE — ED Notes (Signed)
Pt transported via [facility] back to [facility]. This RN reviewed DC instructions with nursing home staff.

## 2021-05-26 DIAGNOSIS — G20 Parkinson's disease: Secondary | ICD-10-CM | POA: Diagnosis not present

## 2021-05-26 DIAGNOSIS — I10 Essential (primary) hypertension: Secondary | ICD-10-CM | POA: Diagnosis not present

## 2021-05-26 DIAGNOSIS — E43 Unspecified severe protein-calorie malnutrition: Secondary | ICD-10-CM | POA: Diagnosis not present

## 2021-05-31 DIAGNOSIS — R296 Repeated falls: Secondary | ICD-10-CM | POA: Diagnosis not present

## 2021-05-31 DIAGNOSIS — G20 Parkinson's disease: Secondary | ICD-10-CM | POA: Diagnosis not present

## 2021-06-11 DIAGNOSIS — G20 Parkinson's disease: Secondary | ICD-10-CM | POA: Diagnosis not present

## 2021-06-13 DIAGNOSIS — I10 Essential (primary) hypertension: Secondary | ICD-10-CM | POA: Diagnosis not present

## 2021-06-14 DIAGNOSIS — R269 Unspecified abnormalities of gait and mobility: Secondary | ICD-10-CM | POA: Diagnosis not present

## 2021-06-14 DIAGNOSIS — G20 Parkinson's disease: Secondary | ICD-10-CM | POA: Diagnosis not present

## 2021-06-14 DIAGNOSIS — M6282 Rhabdomyolysis: Secondary | ICD-10-CM | POA: Diagnosis not present

## 2021-06-14 DIAGNOSIS — E43 Unspecified severe protein-calorie malnutrition: Secondary | ICD-10-CM | POA: Diagnosis not present

## 2021-06-14 DIAGNOSIS — I639 Cerebral infarction, unspecified: Secondary | ICD-10-CM | POA: Diagnosis not present

## 2021-06-20 DIAGNOSIS — G20 Parkinson's disease: Secondary | ICD-10-CM | POA: Diagnosis not present

## 2021-06-20 DIAGNOSIS — I10 Essential (primary) hypertension: Secondary | ICD-10-CM | POA: Diagnosis not present

## 2021-07-07 DIAGNOSIS — K219 Gastro-esophageal reflux disease without esophagitis: Secondary | ICD-10-CM | POA: Diagnosis not present

## 2021-07-07 DIAGNOSIS — E43 Unspecified severe protein-calorie malnutrition: Secondary | ICD-10-CM | POA: Diagnosis not present

## 2021-07-07 DIAGNOSIS — I10 Essential (primary) hypertension: Secondary | ICD-10-CM | POA: Diagnosis not present

## 2021-07-07 DIAGNOSIS — G20 Parkinson's disease: Secondary | ICD-10-CM | POA: Diagnosis not present

## 2021-07-09 DIAGNOSIS — G20 Parkinson's disease: Secondary | ICD-10-CM | POA: Diagnosis not present

## 2021-07-12 DIAGNOSIS — M6282 Rhabdomyolysis: Secondary | ICD-10-CM | POA: Diagnosis not present

## 2021-07-12 DIAGNOSIS — I639 Cerebral infarction, unspecified: Secondary | ICD-10-CM | POA: Diagnosis not present

## 2021-07-12 DIAGNOSIS — G20 Parkinson's disease: Secondary | ICD-10-CM | POA: Diagnosis not present

## 2021-07-12 DIAGNOSIS — R269 Unspecified abnormalities of gait and mobility: Secondary | ICD-10-CM | POA: Diagnosis not present

## 2021-07-19 DIAGNOSIS — E43 Unspecified severe protein-calorie malnutrition: Secondary | ICD-10-CM | POA: Diagnosis not present

## 2021-07-19 DIAGNOSIS — I10 Essential (primary) hypertension: Secondary | ICD-10-CM | POA: Diagnosis not present

## 2021-07-19 DIAGNOSIS — G20 Parkinson's disease: Secondary | ICD-10-CM | POA: Diagnosis not present

## 2021-07-19 DIAGNOSIS — E538 Deficiency of other specified B group vitamins: Secondary | ICD-10-CM | POA: Diagnosis not present

## 2021-07-19 DIAGNOSIS — Z8616 Personal history of COVID-19: Secondary | ICD-10-CM | POA: Diagnosis not present

## 2021-07-19 DIAGNOSIS — M6281 Muscle weakness (generalized): Secondary | ICD-10-CM | POA: Diagnosis not present

## 2021-07-19 DIAGNOSIS — E7849 Other hyperlipidemia: Secondary | ICD-10-CM | POA: Diagnosis not present

## 2021-07-20 DIAGNOSIS — E7849 Other hyperlipidemia: Secondary | ICD-10-CM | POA: Diagnosis not present

## 2021-07-20 DIAGNOSIS — E538 Deficiency of other specified B group vitamins: Secondary | ICD-10-CM | POA: Diagnosis not present

## 2021-07-20 DIAGNOSIS — Z8616 Personal history of COVID-19: Secondary | ICD-10-CM | POA: Diagnosis not present

## 2021-07-20 DIAGNOSIS — M6281 Muscle weakness (generalized): Secondary | ICD-10-CM | POA: Diagnosis not present

## 2021-07-20 DIAGNOSIS — I10 Essential (primary) hypertension: Secondary | ICD-10-CM | POA: Diagnosis not present

## 2021-07-20 DIAGNOSIS — E43 Unspecified severe protein-calorie malnutrition: Secondary | ICD-10-CM | POA: Diagnosis not present

## 2021-07-20 DIAGNOSIS — G20 Parkinson's disease: Secondary | ICD-10-CM | POA: Diagnosis not present

## 2021-07-22 DIAGNOSIS — Z8616 Personal history of COVID-19: Secondary | ICD-10-CM | POA: Diagnosis not present

## 2021-07-22 DIAGNOSIS — E538 Deficiency of other specified B group vitamins: Secondary | ICD-10-CM | POA: Diagnosis not present

## 2021-07-22 DIAGNOSIS — G20 Parkinson's disease: Secondary | ICD-10-CM | POA: Diagnosis not present

## 2021-07-22 DIAGNOSIS — E7849 Other hyperlipidemia: Secondary | ICD-10-CM | POA: Diagnosis not present

## 2021-07-22 DIAGNOSIS — E43 Unspecified severe protein-calorie malnutrition: Secondary | ICD-10-CM | POA: Diagnosis not present

## 2021-07-22 DIAGNOSIS — I10 Essential (primary) hypertension: Secondary | ICD-10-CM | POA: Diagnosis not present

## 2021-07-22 DIAGNOSIS — M6281 Muscle weakness (generalized): Secondary | ICD-10-CM | POA: Diagnosis not present

## 2021-07-23 DIAGNOSIS — I10 Essential (primary) hypertension: Secondary | ICD-10-CM | POA: Diagnosis not present

## 2021-07-23 DIAGNOSIS — B351 Tinea unguium: Secondary | ICD-10-CM | POA: Diagnosis not present

## 2021-07-23 DIAGNOSIS — Z8616 Personal history of COVID-19: Secondary | ICD-10-CM | POA: Diagnosis not present

## 2021-07-23 DIAGNOSIS — E538 Deficiency of other specified B group vitamins: Secondary | ICD-10-CM | POA: Diagnosis not present

## 2021-07-23 DIAGNOSIS — G20 Parkinson's disease: Secondary | ICD-10-CM | POA: Diagnosis not present

## 2021-07-23 DIAGNOSIS — M6281 Muscle weakness (generalized): Secondary | ICD-10-CM | POA: Diagnosis not present

## 2021-07-23 DIAGNOSIS — I739 Peripheral vascular disease, unspecified: Secondary | ICD-10-CM | POA: Diagnosis not present

## 2021-07-23 DIAGNOSIS — E7849 Other hyperlipidemia: Secondary | ICD-10-CM | POA: Diagnosis not present

## 2021-07-23 DIAGNOSIS — E43 Unspecified severe protein-calorie malnutrition: Secondary | ICD-10-CM | POA: Diagnosis not present

## 2021-07-24 DIAGNOSIS — E7849 Other hyperlipidemia: Secondary | ICD-10-CM | POA: Diagnosis not present

## 2021-07-24 DIAGNOSIS — G20 Parkinson's disease: Secondary | ICD-10-CM | POA: Diagnosis not present

## 2021-07-24 DIAGNOSIS — E538 Deficiency of other specified B group vitamins: Secondary | ICD-10-CM | POA: Diagnosis not present

## 2021-07-24 DIAGNOSIS — Z8616 Personal history of COVID-19: Secondary | ICD-10-CM | POA: Diagnosis not present

## 2021-07-24 DIAGNOSIS — E43 Unspecified severe protein-calorie malnutrition: Secondary | ICD-10-CM | POA: Diagnosis not present

## 2021-07-24 DIAGNOSIS — I10 Essential (primary) hypertension: Secondary | ICD-10-CM | POA: Diagnosis not present

## 2021-07-24 DIAGNOSIS — M6281 Muscle weakness (generalized): Secondary | ICD-10-CM | POA: Diagnosis not present

## 2021-07-25 DIAGNOSIS — I10 Essential (primary) hypertension: Secondary | ICD-10-CM | POA: Diagnosis not present

## 2021-07-25 DIAGNOSIS — G20 Parkinson's disease: Secondary | ICD-10-CM | POA: Diagnosis not present

## 2021-07-25 DIAGNOSIS — E538 Deficiency of other specified B group vitamins: Secondary | ICD-10-CM | POA: Diagnosis not present

## 2021-07-25 DIAGNOSIS — Z8616 Personal history of COVID-19: Secondary | ICD-10-CM | POA: Diagnosis not present

## 2021-07-25 DIAGNOSIS — M6281 Muscle weakness (generalized): Secondary | ICD-10-CM | POA: Diagnosis not present

## 2021-07-25 DIAGNOSIS — E43 Unspecified severe protein-calorie malnutrition: Secondary | ICD-10-CM | POA: Diagnosis not present

## 2021-07-25 DIAGNOSIS — E7849 Other hyperlipidemia: Secondary | ICD-10-CM | POA: Diagnosis not present

## 2021-07-26 DIAGNOSIS — E43 Unspecified severe protein-calorie malnutrition: Secondary | ICD-10-CM | POA: Diagnosis not present

## 2021-07-26 DIAGNOSIS — E7849 Other hyperlipidemia: Secondary | ICD-10-CM | POA: Diagnosis not present

## 2021-07-26 DIAGNOSIS — M6281 Muscle weakness (generalized): Secondary | ICD-10-CM | POA: Diagnosis not present

## 2021-07-26 DIAGNOSIS — G20 Parkinson's disease: Secondary | ICD-10-CM | POA: Diagnosis not present

## 2021-07-26 DIAGNOSIS — I10 Essential (primary) hypertension: Secondary | ICD-10-CM | POA: Diagnosis not present

## 2021-07-26 DIAGNOSIS — E538 Deficiency of other specified B group vitamins: Secondary | ICD-10-CM | POA: Diagnosis not present

## 2021-07-26 DIAGNOSIS — Z8616 Personal history of COVID-19: Secondary | ICD-10-CM | POA: Diagnosis not present

## 2021-07-29 DIAGNOSIS — I10 Essential (primary) hypertension: Secondary | ICD-10-CM | POA: Diagnosis not present

## 2021-07-29 DIAGNOSIS — E7849 Other hyperlipidemia: Secondary | ICD-10-CM | POA: Diagnosis not present

## 2021-07-29 DIAGNOSIS — M6281 Muscle weakness (generalized): Secondary | ICD-10-CM | POA: Diagnosis not present

## 2021-07-29 DIAGNOSIS — G20 Parkinson's disease: Secondary | ICD-10-CM | POA: Diagnosis not present

## 2021-07-29 DIAGNOSIS — E538 Deficiency of other specified B group vitamins: Secondary | ICD-10-CM | POA: Diagnosis not present

## 2021-07-29 DIAGNOSIS — E43 Unspecified severe protein-calorie malnutrition: Secondary | ICD-10-CM | POA: Diagnosis not present

## 2021-07-29 DIAGNOSIS — Z8616 Personal history of COVID-19: Secondary | ICD-10-CM | POA: Diagnosis not present

## 2021-07-30 DIAGNOSIS — G20 Parkinson's disease: Secondary | ICD-10-CM | POA: Diagnosis not present

## 2021-07-30 DIAGNOSIS — E538 Deficiency of other specified B group vitamins: Secondary | ICD-10-CM | POA: Diagnosis not present

## 2021-07-30 DIAGNOSIS — E43 Unspecified severe protein-calorie malnutrition: Secondary | ICD-10-CM | POA: Diagnosis not present

## 2021-07-30 DIAGNOSIS — E7849 Other hyperlipidemia: Secondary | ICD-10-CM | POA: Diagnosis not present

## 2021-07-30 DIAGNOSIS — Z8616 Personal history of COVID-19: Secondary | ICD-10-CM | POA: Diagnosis not present

## 2021-07-30 DIAGNOSIS — I10 Essential (primary) hypertension: Secondary | ICD-10-CM | POA: Diagnosis not present

## 2021-07-30 DIAGNOSIS — M6281 Muscle weakness (generalized): Secondary | ICD-10-CM | POA: Diagnosis not present

## 2021-07-31 DIAGNOSIS — E43 Unspecified severe protein-calorie malnutrition: Secondary | ICD-10-CM | POA: Diagnosis not present

## 2021-07-31 DIAGNOSIS — Z8616 Personal history of COVID-19: Secondary | ICD-10-CM | POA: Diagnosis not present

## 2021-07-31 DIAGNOSIS — M6281 Muscle weakness (generalized): Secondary | ICD-10-CM | POA: Diagnosis not present

## 2021-07-31 DIAGNOSIS — E7849 Other hyperlipidemia: Secondary | ICD-10-CM | POA: Diagnosis not present

## 2021-07-31 DIAGNOSIS — G20 Parkinson's disease: Secondary | ICD-10-CM | POA: Diagnosis not present

## 2021-07-31 DIAGNOSIS — I10 Essential (primary) hypertension: Secondary | ICD-10-CM | POA: Diagnosis not present

## 2021-07-31 DIAGNOSIS — E538 Deficiency of other specified B group vitamins: Secondary | ICD-10-CM | POA: Diagnosis not present

## 2021-08-01 DIAGNOSIS — E538 Deficiency of other specified B group vitamins: Secondary | ICD-10-CM | POA: Diagnosis not present

## 2021-08-01 DIAGNOSIS — Z8616 Personal history of COVID-19: Secondary | ICD-10-CM | POA: Diagnosis not present

## 2021-08-01 DIAGNOSIS — E7849 Other hyperlipidemia: Secondary | ICD-10-CM | POA: Diagnosis not present

## 2021-08-01 DIAGNOSIS — G20 Parkinson's disease: Secondary | ICD-10-CM | POA: Diagnosis not present

## 2021-08-01 DIAGNOSIS — I10 Essential (primary) hypertension: Secondary | ICD-10-CM | POA: Diagnosis not present

## 2021-08-01 DIAGNOSIS — E43 Unspecified severe protein-calorie malnutrition: Secondary | ICD-10-CM | POA: Diagnosis not present

## 2021-08-01 DIAGNOSIS — M6281 Muscle weakness (generalized): Secondary | ICD-10-CM | POA: Diagnosis not present

## 2021-08-02 DIAGNOSIS — E43 Unspecified severe protein-calorie malnutrition: Secondary | ICD-10-CM | POA: Diagnosis not present

## 2021-08-02 DIAGNOSIS — E538 Deficiency of other specified B group vitamins: Secondary | ICD-10-CM | POA: Diagnosis not present

## 2021-08-02 DIAGNOSIS — I10 Essential (primary) hypertension: Secondary | ICD-10-CM | POA: Diagnosis not present

## 2021-08-02 DIAGNOSIS — E7849 Other hyperlipidemia: Secondary | ICD-10-CM | POA: Diagnosis not present

## 2021-08-02 DIAGNOSIS — Z8616 Personal history of COVID-19: Secondary | ICD-10-CM | POA: Diagnosis not present

## 2021-08-02 DIAGNOSIS — G20 Parkinson's disease: Secondary | ICD-10-CM | POA: Diagnosis not present

## 2021-08-02 DIAGNOSIS — M6281 Muscle weakness (generalized): Secondary | ICD-10-CM | POA: Diagnosis not present

## 2021-08-06 DIAGNOSIS — M6281 Muscle weakness (generalized): Secondary | ICD-10-CM | POA: Diagnosis not present

## 2021-08-06 DIAGNOSIS — G20 Parkinson's disease: Secondary | ICD-10-CM | POA: Diagnosis not present

## 2021-08-06 DIAGNOSIS — I10 Essential (primary) hypertension: Secondary | ICD-10-CM | POA: Diagnosis not present

## 2021-08-06 DIAGNOSIS — E7849 Other hyperlipidemia: Secondary | ICD-10-CM | POA: Diagnosis not present

## 2021-08-06 DIAGNOSIS — E538 Deficiency of other specified B group vitamins: Secondary | ICD-10-CM | POA: Diagnosis not present

## 2021-08-06 DIAGNOSIS — E43 Unspecified severe protein-calorie malnutrition: Secondary | ICD-10-CM | POA: Diagnosis not present

## 2021-08-06 DIAGNOSIS — Z8616 Personal history of COVID-19: Secondary | ICD-10-CM | POA: Diagnosis not present

## 2021-08-07 DIAGNOSIS — E43 Unspecified severe protein-calorie malnutrition: Secondary | ICD-10-CM | POA: Diagnosis not present

## 2021-08-07 DIAGNOSIS — E538 Deficiency of other specified B group vitamins: Secondary | ICD-10-CM | POA: Diagnosis not present

## 2021-08-07 DIAGNOSIS — Z8616 Personal history of COVID-19: Secondary | ICD-10-CM | POA: Diagnosis not present

## 2021-08-07 DIAGNOSIS — M6281 Muscle weakness (generalized): Secondary | ICD-10-CM | POA: Diagnosis not present

## 2021-08-07 DIAGNOSIS — G20 Parkinson's disease: Secondary | ICD-10-CM | POA: Diagnosis not present

## 2021-08-07 DIAGNOSIS — I10 Essential (primary) hypertension: Secondary | ICD-10-CM | POA: Diagnosis not present

## 2021-08-07 DIAGNOSIS — E7849 Other hyperlipidemia: Secondary | ICD-10-CM | POA: Diagnosis not present

## 2021-08-08 DIAGNOSIS — E43 Unspecified severe protein-calorie malnutrition: Secondary | ICD-10-CM | POA: Diagnosis not present

## 2021-08-08 DIAGNOSIS — Z8616 Personal history of COVID-19: Secondary | ICD-10-CM | POA: Diagnosis not present

## 2021-08-08 DIAGNOSIS — M6281 Muscle weakness (generalized): Secondary | ICD-10-CM | POA: Diagnosis not present

## 2021-08-08 DIAGNOSIS — E7849 Other hyperlipidemia: Secondary | ICD-10-CM | POA: Diagnosis not present

## 2021-08-08 DIAGNOSIS — E538 Deficiency of other specified B group vitamins: Secondary | ICD-10-CM | POA: Diagnosis not present

## 2021-08-08 DIAGNOSIS — G20 Parkinson's disease: Secondary | ICD-10-CM | POA: Diagnosis not present

## 2021-08-08 DIAGNOSIS — I10 Essential (primary) hypertension: Secondary | ICD-10-CM | POA: Diagnosis not present

## 2021-08-09 DIAGNOSIS — G20 Parkinson's disease: Secondary | ICD-10-CM | POA: Diagnosis not present

## 2021-08-09 DIAGNOSIS — M6281 Muscle weakness (generalized): Secondary | ICD-10-CM | POA: Diagnosis not present

## 2021-08-09 DIAGNOSIS — R269 Unspecified abnormalities of gait and mobility: Secondary | ICD-10-CM | POA: Diagnosis not present

## 2021-08-09 DIAGNOSIS — Z8616 Personal history of COVID-19: Secondary | ICD-10-CM | POA: Diagnosis not present

## 2021-08-09 DIAGNOSIS — M6282 Rhabdomyolysis: Secondary | ICD-10-CM | POA: Diagnosis not present

## 2021-08-09 DIAGNOSIS — E7849 Other hyperlipidemia: Secondary | ICD-10-CM | POA: Diagnosis not present

## 2021-08-09 DIAGNOSIS — I639 Cerebral infarction, unspecified: Secondary | ICD-10-CM | POA: Diagnosis not present

## 2021-08-09 DIAGNOSIS — E538 Deficiency of other specified B group vitamins: Secondary | ICD-10-CM | POA: Diagnosis not present

## 2021-08-09 DIAGNOSIS — E43 Unspecified severe protein-calorie malnutrition: Secondary | ICD-10-CM | POA: Diagnosis not present

## 2021-08-09 DIAGNOSIS — I10 Essential (primary) hypertension: Secondary | ICD-10-CM | POA: Diagnosis not present

## 2021-08-10 DIAGNOSIS — E43 Unspecified severe protein-calorie malnutrition: Secondary | ICD-10-CM | POA: Diagnosis not present

## 2021-08-10 DIAGNOSIS — I10 Essential (primary) hypertension: Secondary | ICD-10-CM | POA: Diagnosis not present

## 2021-08-10 DIAGNOSIS — M6281 Muscle weakness (generalized): Secondary | ICD-10-CM | POA: Diagnosis not present

## 2021-08-10 DIAGNOSIS — E538 Deficiency of other specified B group vitamins: Secondary | ICD-10-CM | POA: Diagnosis not present

## 2021-08-10 DIAGNOSIS — G20 Parkinson's disease: Secondary | ICD-10-CM | POA: Diagnosis not present

## 2021-08-10 DIAGNOSIS — E7849 Other hyperlipidemia: Secondary | ICD-10-CM | POA: Diagnosis not present

## 2021-08-10 DIAGNOSIS — Z8616 Personal history of COVID-19: Secondary | ICD-10-CM | POA: Diagnosis not present

## 2021-08-11 DIAGNOSIS — E43 Unspecified severe protein-calorie malnutrition: Secondary | ICD-10-CM | POA: Diagnosis not present

## 2021-08-11 DIAGNOSIS — E7849 Other hyperlipidemia: Secondary | ICD-10-CM | POA: Diagnosis not present

## 2021-08-11 DIAGNOSIS — M6281 Muscle weakness (generalized): Secondary | ICD-10-CM | POA: Diagnosis not present

## 2021-08-11 DIAGNOSIS — G20 Parkinson's disease: Secondary | ICD-10-CM | POA: Diagnosis not present

## 2021-08-11 DIAGNOSIS — Z8616 Personal history of COVID-19: Secondary | ICD-10-CM | POA: Diagnosis not present

## 2021-08-11 DIAGNOSIS — I10 Essential (primary) hypertension: Secondary | ICD-10-CM | POA: Diagnosis not present

## 2021-08-11 DIAGNOSIS — E538 Deficiency of other specified B group vitamins: Secondary | ICD-10-CM | POA: Diagnosis not present

## 2021-08-12 DIAGNOSIS — G20 Parkinson's disease: Secondary | ICD-10-CM | POA: Diagnosis not present

## 2021-08-12 DIAGNOSIS — R296 Repeated falls: Secondary | ICD-10-CM | POA: Diagnosis not present

## 2021-08-12 DIAGNOSIS — I10 Essential (primary) hypertension: Secondary | ICD-10-CM | POA: Diagnosis not present

## 2021-08-12 DIAGNOSIS — E7849 Other hyperlipidemia: Secondary | ICD-10-CM | POA: Diagnosis not present

## 2021-08-12 DIAGNOSIS — Z8616 Personal history of COVID-19: Secondary | ICD-10-CM | POA: Diagnosis not present

## 2021-08-12 DIAGNOSIS — E43 Unspecified severe protein-calorie malnutrition: Secondary | ICD-10-CM | POA: Diagnosis not present

## 2021-08-12 DIAGNOSIS — E538 Deficiency of other specified B group vitamins: Secondary | ICD-10-CM | POA: Diagnosis not present

## 2021-08-12 DIAGNOSIS — M6281 Muscle weakness (generalized): Secondary | ICD-10-CM | POA: Diagnosis not present

## 2021-08-13 DIAGNOSIS — E538 Deficiency of other specified B group vitamins: Secondary | ICD-10-CM | POA: Diagnosis not present

## 2021-08-13 DIAGNOSIS — M6281 Muscle weakness (generalized): Secondary | ICD-10-CM | POA: Diagnosis not present

## 2021-08-13 DIAGNOSIS — G20 Parkinson's disease: Secondary | ICD-10-CM | POA: Diagnosis not present

## 2021-08-13 DIAGNOSIS — Z8616 Personal history of COVID-19: Secondary | ICD-10-CM | POA: Diagnosis not present

## 2021-08-13 DIAGNOSIS — F02B18 Dementia in other diseases classified elsewhere, moderate, with other behavioral disturbance: Secondary | ICD-10-CM | POA: Diagnosis not present

## 2021-08-13 DIAGNOSIS — E43 Unspecified severe protein-calorie malnutrition: Secondary | ICD-10-CM | POA: Diagnosis not present

## 2021-08-13 DIAGNOSIS — I10 Essential (primary) hypertension: Secondary | ICD-10-CM | POA: Diagnosis not present

## 2021-08-13 DIAGNOSIS — E7849 Other hyperlipidemia: Secondary | ICD-10-CM | POA: Diagnosis not present

## 2021-08-14 DIAGNOSIS — G20 Parkinson's disease: Secondary | ICD-10-CM | POA: Diagnosis not present

## 2021-08-14 DIAGNOSIS — E43 Unspecified severe protein-calorie malnutrition: Secondary | ICD-10-CM | POA: Diagnosis not present

## 2021-08-14 DIAGNOSIS — E538 Deficiency of other specified B group vitamins: Secondary | ICD-10-CM | POA: Diagnosis not present

## 2021-08-14 DIAGNOSIS — M6281 Muscle weakness (generalized): Secondary | ICD-10-CM | POA: Diagnosis not present

## 2021-08-14 DIAGNOSIS — Z8616 Personal history of COVID-19: Secondary | ICD-10-CM | POA: Diagnosis not present

## 2021-08-14 DIAGNOSIS — I10 Essential (primary) hypertension: Secondary | ICD-10-CM | POA: Diagnosis not present

## 2021-08-14 DIAGNOSIS — E7849 Other hyperlipidemia: Secondary | ICD-10-CM | POA: Diagnosis not present

## 2021-08-15 DIAGNOSIS — G20 Parkinson's disease: Secondary | ICD-10-CM | POA: Diagnosis not present

## 2021-08-15 DIAGNOSIS — E7849 Other hyperlipidemia: Secondary | ICD-10-CM | POA: Diagnosis not present

## 2021-08-15 DIAGNOSIS — E43 Unspecified severe protein-calorie malnutrition: Secondary | ICD-10-CM | POA: Diagnosis not present

## 2021-08-15 DIAGNOSIS — E538 Deficiency of other specified B group vitamins: Secondary | ICD-10-CM | POA: Diagnosis not present

## 2021-08-15 DIAGNOSIS — M6281 Muscle weakness (generalized): Secondary | ICD-10-CM | POA: Diagnosis not present

## 2021-08-15 DIAGNOSIS — Z8616 Personal history of COVID-19: Secondary | ICD-10-CM | POA: Diagnosis not present

## 2021-08-15 DIAGNOSIS — I10 Essential (primary) hypertension: Secondary | ICD-10-CM | POA: Diagnosis not present

## 2021-08-16 DIAGNOSIS — Z8616 Personal history of COVID-19: Secondary | ICD-10-CM | POA: Diagnosis not present

## 2021-08-16 DIAGNOSIS — M6281 Muscle weakness (generalized): Secondary | ICD-10-CM | POA: Diagnosis not present

## 2021-08-16 DIAGNOSIS — E7849 Other hyperlipidemia: Secondary | ICD-10-CM | POA: Diagnosis not present

## 2021-08-16 DIAGNOSIS — I10 Essential (primary) hypertension: Secondary | ICD-10-CM | POA: Diagnosis not present

## 2021-08-16 DIAGNOSIS — G20 Parkinson's disease: Secondary | ICD-10-CM | POA: Diagnosis not present

## 2021-08-16 DIAGNOSIS — E43 Unspecified severe protein-calorie malnutrition: Secondary | ICD-10-CM | POA: Diagnosis not present

## 2021-08-16 DIAGNOSIS — E538 Deficiency of other specified B group vitamins: Secondary | ICD-10-CM | POA: Diagnosis not present

## 2021-08-17 DIAGNOSIS — E7849 Other hyperlipidemia: Secondary | ICD-10-CM | POA: Diagnosis not present

## 2021-08-17 DIAGNOSIS — E538 Deficiency of other specified B group vitamins: Secondary | ICD-10-CM | POA: Diagnosis not present

## 2021-08-17 DIAGNOSIS — Z8616 Personal history of COVID-19: Secondary | ICD-10-CM | POA: Diagnosis not present

## 2021-08-17 DIAGNOSIS — M6281 Muscle weakness (generalized): Secondary | ICD-10-CM | POA: Diagnosis not present

## 2021-08-17 DIAGNOSIS — I10 Essential (primary) hypertension: Secondary | ICD-10-CM | POA: Diagnosis not present

## 2021-08-17 DIAGNOSIS — E43 Unspecified severe protein-calorie malnutrition: Secondary | ICD-10-CM | POA: Diagnosis not present

## 2021-08-17 DIAGNOSIS — G20 Parkinson's disease: Secondary | ICD-10-CM | POA: Diagnosis not present

## 2021-08-18 DIAGNOSIS — E43 Unspecified severe protein-calorie malnutrition: Secondary | ICD-10-CM | POA: Diagnosis not present

## 2021-08-18 DIAGNOSIS — M6281 Muscle weakness (generalized): Secondary | ICD-10-CM | POA: Diagnosis not present

## 2021-08-18 DIAGNOSIS — E538 Deficiency of other specified B group vitamins: Secondary | ICD-10-CM | POA: Diagnosis not present

## 2021-08-18 DIAGNOSIS — G20 Parkinson's disease: Secondary | ICD-10-CM | POA: Diagnosis not present

## 2021-08-18 DIAGNOSIS — Z8616 Personal history of COVID-19: Secondary | ICD-10-CM | POA: Diagnosis not present

## 2021-08-18 DIAGNOSIS — I10 Essential (primary) hypertension: Secondary | ICD-10-CM | POA: Diagnosis not present

## 2021-08-18 DIAGNOSIS — E7849 Other hyperlipidemia: Secondary | ICD-10-CM | POA: Diagnosis not present

## 2021-08-19 DIAGNOSIS — I10 Essential (primary) hypertension: Secondary | ICD-10-CM | POA: Diagnosis not present

## 2021-08-19 DIAGNOSIS — G20 Parkinson's disease: Secondary | ICD-10-CM | POA: Diagnosis not present

## 2021-08-19 DIAGNOSIS — M6281 Muscle weakness (generalized): Secondary | ICD-10-CM | POA: Diagnosis not present

## 2021-08-19 DIAGNOSIS — Z8616 Personal history of COVID-19: Secondary | ICD-10-CM | POA: Diagnosis not present

## 2021-08-19 DIAGNOSIS — E7849 Other hyperlipidemia: Secondary | ICD-10-CM | POA: Diagnosis not present

## 2021-08-19 DIAGNOSIS — E43 Unspecified severe protein-calorie malnutrition: Secondary | ICD-10-CM | POA: Diagnosis not present

## 2021-08-19 DIAGNOSIS — E538 Deficiency of other specified B group vitamins: Secondary | ICD-10-CM | POA: Diagnosis not present

## 2021-08-20 DIAGNOSIS — M6281 Muscle weakness (generalized): Secondary | ICD-10-CM | POA: Diagnosis not present

## 2021-08-20 DIAGNOSIS — E7849 Other hyperlipidemia: Secondary | ICD-10-CM | POA: Diagnosis not present

## 2021-08-20 DIAGNOSIS — G20 Parkinson's disease: Secondary | ICD-10-CM | POA: Diagnosis not present

## 2021-08-20 DIAGNOSIS — Z8616 Personal history of COVID-19: Secondary | ICD-10-CM | POA: Diagnosis not present

## 2021-08-20 DIAGNOSIS — I10 Essential (primary) hypertension: Secondary | ICD-10-CM | POA: Diagnosis not present

## 2021-08-20 DIAGNOSIS — E538 Deficiency of other specified B group vitamins: Secondary | ICD-10-CM | POA: Diagnosis not present

## 2021-08-20 DIAGNOSIS — E43 Unspecified severe protein-calorie malnutrition: Secondary | ICD-10-CM | POA: Diagnosis not present

## 2021-08-21 DIAGNOSIS — Z8616 Personal history of COVID-19: Secondary | ICD-10-CM | POA: Diagnosis not present

## 2021-08-21 DIAGNOSIS — G20 Parkinson's disease: Secondary | ICD-10-CM | POA: Diagnosis not present

## 2021-08-21 DIAGNOSIS — E43 Unspecified severe protein-calorie malnutrition: Secondary | ICD-10-CM | POA: Diagnosis not present

## 2021-08-21 DIAGNOSIS — E538 Deficiency of other specified B group vitamins: Secondary | ICD-10-CM | POA: Diagnosis not present

## 2021-08-21 DIAGNOSIS — M6281 Muscle weakness (generalized): Secondary | ICD-10-CM | POA: Diagnosis not present

## 2021-08-21 DIAGNOSIS — E7849 Other hyperlipidemia: Secondary | ICD-10-CM | POA: Diagnosis not present

## 2021-08-21 DIAGNOSIS — I10 Essential (primary) hypertension: Secondary | ICD-10-CM | POA: Diagnosis not present

## 2021-08-22 DIAGNOSIS — E7849 Other hyperlipidemia: Secondary | ICD-10-CM | POA: Diagnosis not present

## 2021-08-22 DIAGNOSIS — I10 Essential (primary) hypertension: Secondary | ICD-10-CM | POA: Diagnosis not present

## 2021-08-22 DIAGNOSIS — M6281 Muscle weakness (generalized): Secondary | ICD-10-CM | POA: Diagnosis not present

## 2021-08-22 DIAGNOSIS — E538 Deficiency of other specified B group vitamins: Secondary | ICD-10-CM | POA: Diagnosis not present

## 2021-08-22 DIAGNOSIS — E43 Unspecified severe protein-calorie malnutrition: Secondary | ICD-10-CM | POA: Diagnosis not present

## 2021-08-22 DIAGNOSIS — Z8616 Personal history of COVID-19: Secondary | ICD-10-CM | POA: Diagnosis not present

## 2021-08-22 DIAGNOSIS — G20 Parkinson's disease: Secondary | ICD-10-CM | POA: Diagnosis not present

## 2021-08-23 DIAGNOSIS — G20 Parkinson's disease: Secondary | ICD-10-CM | POA: Diagnosis not present

## 2021-08-23 DIAGNOSIS — I10 Essential (primary) hypertension: Secondary | ICD-10-CM | POA: Diagnosis not present

## 2021-08-23 DIAGNOSIS — E538 Deficiency of other specified B group vitamins: Secondary | ICD-10-CM | POA: Diagnosis not present

## 2021-08-23 DIAGNOSIS — E7849 Other hyperlipidemia: Secondary | ICD-10-CM | POA: Diagnosis not present

## 2021-08-23 DIAGNOSIS — E43 Unspecified severe protein-calorie malnutrition: Secondary | ICD-10-CM | POA: Diagnosis not present

## 2021-08-23 DIAGNOSIS — Z8616 Personal history of COVID-19: Secondary | ICD-10-CM | POA: Diagnosis not present

## 2021-08-23 DIAGNOSIS — M6281 Muscle weakness (generalized): Secondary | ICD-10-CM | POA: Diagnosis not present

## 2021-08-24 DIAGNOSIS — E43 Unspecified severe protein-calorie malnutrition: Secondary | ICD-10-CM | POA: Diagnosis not present

## 2021-08-24 DIAGNOSIS — G20 Parkinson's disease: Secondary | ICD-10-CM | POA: Diagnosis not present

## 2021-08-24 DIAGNOSIS — K219 Gastro-esophageal reflux disease without esophagitis: Secondary | ICD-10-CM | POA: Diagnosis not present

## 2021-08-24 DIAGNOSIS — I10 Essential (primary) hypertension: Secondary | ICD-10-CM | POA: Diagnosis not present

## 2021-08-24 DIAGNOSIS — K5909 Other constipation: Secondary | ICD-10-CM | POA: Diagnosis not present

## 2021-08-24 DIAGNOSIS — R296 Repeated falls: Secondary | ICD-10-CM | POA: Diagnosis not present

## 2021-08-26 DIAGNOSIS — M6281 Muscle weakness (generalized): Secondary | ICD-10-CM | POA: Diagnosis not present

## 2021-08-26 DIAGNOSIS — E43 Unspecified severe protein-calorie malnutrition: Secondary | ICD-10-CM | POA: Diagnosis not present

## 2021-08-26 DIAGNOSIS — I10 Essential (primary) hypertension: Secondary | ICD-10-CM | POA: Diagnosis not present

## 2021-08-26 DIAGNOSIS — G20 Parkinson's disease: Secondary | ICD-10-CM | POA: Diagnosis not present

## 2021-08-26 DIAGNOSIS — E538 Deficiency of other specified B group vitamins: Secondary | ICD-10-CM | POA: Diagnosis not present

## 2021-08-26 DIAGNOSIS — Z8616 Personal history of COVID-19: Secondary | ICD-10-CM | POA: Diagnosis not present

## 2021-08-26 DIAGNOSIS — E7849 Other hyperlipidemia: Secondary | ICD-10-CM | POA: Diagnosis not present

## 2021-08-27 DIAGNOSIS — E43 Unspecified severe protein-calorie malnutrition: Secondary | ICD-10-CM | POA: Diagnosis not present

## 2021-08-27 DIAGNOSIS — E538 Deficiency of other specified B group vitamins: Secondary | ICD-10-CM | POA: Diagnosis not present

## 2021-08-27 DIAGNOSIS — M6281 Muscle weakness (generalized): Secondary | ICD-10-CM | POA: Diagnosis not present

## 2021-08-27 DIAGNOSIS — Z8616 Personal history of COVID-19: Secondary | ICD-10-CM | POA: Diagnosis not present

## 2021-08-27 DIAGNOSIS — I10 Essential (primary) hypertension: Secondary | ICD-10-CM | POA: Diagnosis not present

## 2021-08-27 DIAGNOSIS — G20 Parkinson's disease: Secondary | ICD-10-CM | POA: Diagnosis not present

## 2021-08-27 DIAGNOSIS — E7849 Other hyperlipidemia: Secondary | ICD-10-CM | POA: Diagnosis not present

## 2021-08-28 DIAGNOSIS — E43 Unspecified severe protein-calorie malnutrition: Secondary | ICD-10-CM | POA: Diagnosis not present

## 2021-08-28 DIAGNOSIS — E538 Deficiency of other specified B group vitamins: Secondary | ICD-10-CM | POA: Diagnosis not present

## 2021-08-28 DIAGNOSIS — M6281 Muscle weakness (generalized): Secondary | ICD-10-CM | POA: Diagnosis not present

## 2021-08-28 DIAGNOSIS — E7849 Other hyperlipidemia: Secondary | ICD-10-CM | POA: Diagnosis not present

## 2021-08-28 DIAGNOSIS — Z8616 Personal history of COVID-19: Secondary | ICD-10-CM | POA: Diagnosis not present

## 2021-08-28 DIAGNOSIS — I10 Essential (primary) hypertension: Secondary | ICD-10-CM | POA: Diagnosis not present

## 2021-08-28 DIAGNOSIS — G20 Parkinson's disease: Secondary | ICD-10-CM | POA: Diagnosis not present

## 2021-08-29 DIAGNOSIS — Z8616 Personal history of COVID-19: Secondary | ICD-10-CM | POA: Diagnosis not present

## 2021-08-29 DIAGNOSIS — E538 Deficiency of other specified B group vitamins: Secondary | ICD-10-CM | POA: Diagnosis not present

## 2021-08-29 DIAGNOSIS — E43 Unspecified severe protein-calorie malnutrition: Secondary | ICD-10-CM | POA: Diagnosis not present

## 2021-08-29 DIAGNOSIS — I10 Essential (primary) hypertension: Secondary | ICD-10-CM | POA: Diagnosis not present

## 2021-08-29 DIAGNOSIS — E7849 Other hyperlipidemia: Secondary | ICD-10-CM | POA: Diagnosis not present

## 2021-08-29 DIAGNOSIS — G20 Parkinson's disease: Secondary | ICD-10-CM | POA: Diagnosis not present

## 2021-08-29 DIAGNOSIS — M6281 Muscle weakness (generalized): Secondary | ICD-10-CM | POA: Diagnosis not present

## 2021-08-30 DIAGNOSIS — M6281 Muscle weakness (generalized): Secondary | ICD-10-CM | POA: Diagnosis not present

## 2021-08-30 DIAGNOSIS — E538 Deficiency of other specified B group vitamins: Secondary | ICD-10-CM | POA: Diagnosis not present

## 2021-08-30 DIAGNOSIS — E7849 Other hyperlipidemia: Secondary | ICD-10-CM | POA: Diagnosis not present

## 2021-08-30 DIAGNOSIS — G20 Parkinson's disease: Secondary | ICD-10-CM | POA: Diagnosis not present

## 2021-08-30 DIAGNOSIS — E43 Unspecified severe protein-calorie malnutrition: Secondary | ICD-10-CM | POA: Diagnosis not present

## 2021-08-30 DIAGNOSIS — Z8616 Personal history of COVID-19: Secondary | ICD-10-CM | POA: Diagnosis not present

## 2021-08-30 DIAGNOSIS — I10 Essential (primary) hypertension: Secondary | ICD-10-CM | POA: Diagnosis not present

## 2021-09-02 DIAGNOSIS — Z8616 Personal history of COVID-19: Secondary | ICD-10-CM | POA: Diagnosis not present

## 2021-09-02 DIAGNOSIS — E7849 Other hyperlipidemia: Secondary | ICD-10-CM | POA: Diagnosis not present

## 2021-09-02 DIAGNOSIS — M6281 Muscle weakness (generalized): Secondary | ICD-10-CM | POA: Diagnosis not present

## 2021-09-02 DIAGNOSIS — E538 Deficiency of other specified B group vitamins: Secondary | ICD-10-CM | POA: Diagnosis not present

## 2021-09-02 DIAGNOSIS — G20 Parkinson's disease: Secondary | ICD-10-CM | POA: Diagnosis not present

## 2021-09-02 DIAGNOSIS — E43 Unspecified severe protein-calorie malnutrition: Secondary | ICD-10-CM | POA: Diagnosis not present

## 2021-09-02 DIAGNOSIS — I10 Essential (primary) hypertension: Secondary | ICD-10-CM | POA: Diagnosis not present

## 2021-09-03 DIAGNOSIS — M6281 Muscle weakness (generalized): Secondary | ICD-10-CM | POA: Diagnosis not present

## 2021-09-03 DIAGNOSIS — Z8616 Personal history of COVID-19: Secondary | ICD-10-CM | POA: Diagnosis not present

## 2021-09-03 DIAGNOSIS — E43 Unspecified severe protein-calorie malnutrition: Secondary | ICD-10-CM | POA: Diagnosis not present

## 2021-09-03 DIAGNOSIS — G20 Parkinson's disease: Secondary | ICD-10-CM | POA: Diagnosis not present

## 2021-09-03 DIAGNOSIS — E7849 Other hyperlipidemia: Secondary | ICD-10-CM | POA: Diagnosis not present

## 2021-09-03 DIAGNOSIS — I10 Essential (primary) hypertension: Secondary | ICD-10-CM | POA: Diagnosis not present

## 2021-09-03 DIAGNOSIS — E538 Deficiency of other specified B group vitamins: Secondary | ICD-10-CM | POA: Diagnosis not present

## 2021-09-03 DIAGNOSIS — F02B18 Dementia in other diseases classified elsewhere, moderate, with other behavioral disturbance: Secondary | ICD-10-CM | POA: Diagnosis not present

## 2021-09-04 DIAGNOSIS — Z8616 Personal history of COVID-19: Secondary | ICD-10-CM | POA: Diagnosis not present

## 2021-09-04 DIAGNOSIS — E7849 Other hyperlipidemia: Secondary | ICD-10-CM | POA: Diagnosis not present

## 2021-09-04 DIAGNOSIS — M6281 Muscle weakness (generalized): Secondary | ICD-10-CM | POA: Diagnosis not present

## 2021-09-04 DIAGNOSIS — I10 Essential (primary) hypertension: Secondary | ICD-10-CM | POA: Diagnosis not present

## 2021-09-04 DIAGNOSIS — E43 Unspecified severe protein-calorie malnutrition: Secondary | ICD-10-CM | POA: Diagnosis not present

## 2021-09-04 DIAGNOSIS — G20 Parkinson's disease: Secondary | ICD-10-CM | POA: Diagnosis not present

## 2021-09-04 DIAGNOSIS — E538 Deficiency of other specified B group vitamins: Secondary | ICD-10-CM | POA: Diagnosis not present

## 2021-09-05 DIAGNOSIS — M6281 Muscle weakness (generalized): Secondary | ICD-10-CM | POA: Diagnosis not present

## 2021-09-05 DIAGNOSIS — E7849 Other hyperlipidemia: Secondary | ICD-10-CM | POA: Diagnosis not present

## 2021-09-05 DIAGNOSIS — I10 Essential (primary) hypertension: Secondary | ICD-10-CM | POA: Diagnosis not present

## 2021-09-05 DIAGNOSIS — Z8616 Personal history of COVID-19: Secondary | ICD-10-CM | POA: Diagnosis not present

## 2021-09-05 DIAGNOSIS — G20 Parkinson's disease: Secondary | ICD-10-CM | POA: Diagnosis not present

## 2021-09-05 DIAGNOSIS — E538 Deficiency of other specified B group vitamins: Secondary | ICD-10-CM | POA: Diagnosis not present

## 2021-09-05 DIAGNOSIS — E43 Unspecified severe protein-calorie malnutrition: Secondary | ICD-10-CM | POA: Diagnosis not present

## 2021-09-06 DIAGNOSIS — E538 Deficiency of other specified B group vitamins: Secondary | ICD-10-CM | POA: Diagnosis not present

## 2021-09-06 DIAGNOSIS — I10 Essential (primary) hypertension: Secondary | ICD-10-CM | POA: Diagnosis not present

## 2021-09-06 DIAGNOSIS — E43 Unspecified severe protein-calorie malnutrition: Secondary | ICD-10-CM | POA: Diagnosis not present

## 2021-09-06 DIAGNOSIS — M6281 Muscle weakness (generalized): Secondary | ICD-10-CM | POA: Diagnosis not present

## 2021-09-06 DIAGNOSIS — Z8616 Personal history of COVID-19: Secondary | ICD-10-CM | POA: Diagnosis not present

## 2021-09-06 DIAGNOSIS — G20 Parkinson's disease: Secondary | ICD-10-CM | POA: Diagnosis not present

## 2021-09-06 DIAGNOSIS — E7849 Other hyperlipidemia: Secondary | ICD-10-CM | POA: Diagnosis not present

## 2021-09-09 DIAGNOSIS — E538 Deficiency of other specified B group vitamins: Secondary | ICD-10-CM | POA: Diagnosis not present

## 2021-09-09 DIAGNOSIS — E7849 Other hyperlipidemia: Secondary | ICD-10-CM | POA: Diagnosis not present

## 2021-09-09 DIAGNOSIS — M6281 Muscle weakness (generalized): Secondary | ICD-10-CM | POA: Diagnosis not present

## 2021-09-09 DIAGNOSIS — I10 Essential (primary) hypertension: Secondary | ICD-10-CM | POA: Diagnosis not present

## 2021-09-09 DIAGNOSIS — G20 Parkinson's disease: Secondary | ICD-10-CM | POA: Diagnosis not present

## 2021-09-09 DIAGNOSIS — Z8616 Personal history of COVID-19: Secondary | ICD-10-CM | POA: Diagnosis not present

## 2021-09-09 DIAGNOSIS — E43 Unspecified severe protein-calorie malnutrition: Secondary | ICD-10-CM | POA: Diagnosis not present

## 2021-09-10 DIAGNOSIS — I10 Essential (primary) hypertension: Secondary | ICD-10-CM | POA: Diagnosis not present

## 2021-09-10 DIAGNOSIS — E538 Deficiency of other specified B group vitamins: Secondary | ICD-10-CM | POA: Diagnosis not present

## 2021-09-10 DIAGNOSIS — E43 Unspecified severe protein-calorie malnutrition: Secondary | ICD-10-CM | POA: Diagnosis not present

## 2021-09-10 DIAGNOSIS — Z8616 Personal history of COVID-19: Secondary | ICD-10-CM | POA: Diagnosis not present

## 2021-09-10 DIAGNOSIS — G20 Parkinson's disease: Secondary | ICD-10-CM | POA: Diagnosis not present

## 2021-09-10 DIAGNOSIS — E7849 Other hyperlipidemia: Secondary | ICD-10-CM | POA: Diagnosis not present

## 2021-09-10 DIAGNOSIS — M6281 Muscle weakness (generalized): Secondary | ICD-10-CM | POA: Diagnosis not present

## 2021-09-11 DIAGNOSIS — I10 Essential (primary) hypertension: Secondary | ICD-10-CM | POA: Diagnosis not present

## 2021-09-11 DIAGNOSIS — M6281 Muscle weakness (generalized): Secondary | ICD-10-CM | POA: Diagnosis not present

## 2021-09-11 DIAGNOSIS — E538 Deficiency of other specified B group vitamins: Secondary | ICD-10-CM | POA: Diagnosis not present

## 2021-09-11 DIAGNOSIS — E43 Unspecified severe protein-calorie malnutrition: Secondary | ICD-10-CM | POA: Diagnosis not present

## 2021-09-11 DIAGNOSIS — M545 Low back pain, unspecified: Secondary | ICD-10-CM | POA: Diagnosis not present

## 2021-09-11 DIAGNOSIS — Z8616 Personal history of COVID-19: Secondary | ICD-10-CM | POA: Diagnosis not present

## 2021-09-11 DIAGNOSIS — G20 Parkinson's disease: Secondary | ICD-10-CM | POA: Diagnosis not present

## 2021-09-11 DIAGNOSIS — E7849 Other hyperlipidemia: Secondary | ICD-10-CM | POA: Diagnosis not present

## 2021-09-12 DIAGNOSIS — E7849 Other hyperlipidemia: Secondary | ICD-10-CM | POA: Diagnosis not present

## 2021-09-12 DIAGNOSIS — Z8616 Personal history of COVID-19: Secondary | ICD-10-CM | POA: Diagnosis not present

## 2021-09-12 DIAGNOSIS — E538 Deficiency of other specified B group vitamins: Secondary | ICD-10-CM | POA: Diagnosis not present

## 2021-09-12 DIAGNOSIS — E43 Unspecified severe protein-calorie malnutrition: Secondary | ICD-10-CM | POA: Diagnosis not present

## 2021-09-12 DIAGNOSIS — M6281 Muscle weakness (generalized): Secondary | ICD-10-CM | POA: Diagnosis not present

## 2021-09-12 DIAGNOSIS — G20 Parkinson's disease: Secondary | ICD-10-CM | POA: Diagnosis not present

## 2021-09-12 DIAGNOSIS — I10 Essential (primary) hypertension: Secondary | ICD-10-CM | POA: Diagnosis not present

## 2021-09-13 DIAGNOSIS — E538 Deficiency of other specified B group vitamins: Secondary | ICD-10-CM | POA: Diagnosis not present

## 2021-09-13 DIAGNOSIS — I639 Cerebral infarction, unspecified: Secondary | ICD-10-CM | POA: Diagnosis not present

## 2021-09-13 DIAGNOSIS — E43 Unspecified severe protein-calorie malnutrition: Secondary | ICD-10-CM | POA: Diagnosis not present

## 2021-09-13 DIAGNOSIS — R269 Unspecified abnormalities of gait and mobility: Secondary | ICD-10-CM | POA: Diagnosis not present

## 2021-09-13 DIAGNOSIS — M6282 Rhabdomyolysis: Secondary | ICD-10-CM | POA: Diagnosis not present

## 2021-09-13 DIAGNOSIS — M6281 Muscle weakness (generalized): Secondary | ICD-10-CM | POA: Diagnosis not present

## 2021-09-13 DIAGNOSIS — Z8616 Personal history of COVID-19: Secondary | ICD-10-CM | POA: Diagnosis not present

## 2021-09-13 DIAGNOSIS — I10 Essential (primary) hypertension: Secondary | ICD-10-CM | POA: Diagnosis not present

## 2021-09-13 DIAGNOSIS — G20 Parkinson's disease: Secondary | ICD-10-CM | POA: Diagnosis not present

## 2021-09-13 DIAGNOSIS — E7849 Other hyperlipidemia: Secondary | ICD-10-CM | POA: Diagnosis not present

## 2021-09-15 DIAGNOSIS — E7849 Other hyperlipidemia: Secondary | ICD-10-CM | POA: Diagnosis not present

## 2021-09-15 DIAGNOSIS — E43 Unspecified severe protein-calorie malnutrition: Secondary | ICD-10-CM | POA: Diagnosis not present

## 2021-09-15 DIAGNOSIS — E538 Deficiency of other specified B group vitamins: Secondary | ICD-10-CM | POA: Diagnosis not present

## 2021-09-15 DIAGNOSIS — I10 Essential (primary) hypertension: Secondary | ICD-10-CM | POA: Diagnosis not present

## 2021-09-15 DIAGNOSIS — M6281 Muscle weakness (generalized): Secondary | ICD-10-CM | POA: Diagnosis not present

## 2021-09-15 DIAGNOSIS — Z8616 Personal history of COVID-19: Secondary | ICD-10-CM | POA: Diagnosis not present

## 2021-09-15 DIAGNOSIS — G20 Parkinson's disease: Secondary | ICD-10-CM | POA: Diagnosis not present

## 2021-09-16 DIAGNOSIS — M6281 Muscle weakness (generalized): Secondary | ICD-10-CM | POA: Diagnosis not present

## 2021-09-16 DIAGNOSIS — G20 Parkinson's disease: Secondary | ICD-10-CM | POA: Diagnosis not present

## 2021-09-16 DIAGNOSIS — E538 Deficiency of other specified B group vitamins: Secondary | ICD-10-CM | POA: Diagnosis not present

## 2021-09-16 DIAGNOSIS — E7849 Other hyperlipidemia: Secondary | ICD-10-CM | POA: Diagnosis not present

## 2021-09-16 DIAGNOSIS — Z8616 Personal history of COVID-19: Secondary | ICD-10-CM | POA: Diagnosis not present

## 2021-09-16 DIAGNOSIS — E43 Unspecified severe protein-calorie malnutrition: Secondary | ICD-10-CM | POA: Diagnosis not present

## 2021-09-16 DIAGNOSIS — I10 Essential (primary) hypertension: Secondary | ICD-10-CM | POA: Diagnosis not present

## 2021-09-17 DIAGNOSIS — E43 Unspecified severe protein-calorie malnutrition: Secondary | ICD-10-CM | POA: Diagnosis not present

## 2021-09-17 DIAGNOSIS — M6281 Muscle weakness (generalized): Secondary | ICD-10-CM | POA: Diagnosis not present

## 2021-09-17 DIAGNOSIS — G20 Parkinson's disease: Secondary | ICD-10-CM | POA: Diagnosis not present

## 2021-09-17 DIAGNOSIS — E7849 Other hyperlipidemia: Secondary | ICD-10-CM | POA: Diagnosis not present

## 2021-09-17 DIAGNOSIS — I10 Essential (primary) hypertension: Secondary | ICD-10-CM | POA: Diagnosis not present

## 2021-09-17 DIAGNOSIS — E538 Deficiency of other specified B group vitamins: Secondary | ICD-10-CM | POA: Diagnosis not present

## 2021-09-17 DIAGNOSIS — Z8616 Personal history of COVID-19: Secondary | ICD-10-CM | POA: Diagnosis not present

## 2021-09-18 DIAGNOSIS — E43 Unspecified severe protein-calorie malnutrition: Secondary | ICD-10-CM | POA: Diagnosis not present

## 2021-09-18 DIAGNOSIS — Z8616 Personal history of COVID-19: Secondary | ICD-10-CM | POA: Diagnosis not present

## 2021-09-18 DIAGNOSIS — E7849 Other hyperlipidemia: Secondary | ICD-10-CM | POA: Diagnosis not present

## 2021-09-18 DIAGNOSIS — G20 Parkinson's disease: Secondary | ICD-10-CM | POA: Diagnosis not present

## 2021-09-18 DIAGNOSIS — E538 Deficiency of other specified B group vitamins: Secondary | ICD-10-CM | POA: Diagnosis not present

## 2021-09-18 DIAGNOSIS — I10 Essential (primary) hypertension: Secondary | ICD-10-CM | POA: Diagnosis not present

## 2021-09-18 DIAGNOSIS — M6281 Muscle weakness (generalized): Secondary | ICD-10-CM | POA: Diagnosis not present

## 2021-09-19 DIAGNOSIS — G20 Parkinson's disease: Secondary | ICD-10-CM | POA: Diagnosis not present

## 2021-09-19 DIAGNOSIS — E43 Unspecified severe protein-calorie malnutrition: Secondary | ICD-10-CM | POA: Diagnosis not present

## 2021-09-19 DIAGNOSIS — I10 Essential (primary) hypertension: Secondary | ICD-10-CM | POA: Diagnosis not present

## 2021-09-19 DIAGNOSIS — E7849 Other hyperlipidemia: Secondary | ICD-10-CM | POA: Diagnosis not present

## 2021-09-19 DIAGNOSIS — E538 Deficiency of other specified B group vitamins: Secondary | ICD-10-CM | POA: Diagnosis not present

## 2021-09-19 DIAGNOSIS — Z8616 Personal history of COVID-19: Secondary | ICD-10-CM | POA: Diagnosis not present

## 2021-09-19 DIAGNOSIS — M6281 Muscle weakness (generalized): Secondary | ICD-10-CM | POA: Diagnosis not present

## 2021-09-20 DIAGNOSIS — Z8616 Personal history of COVID-19: Secondary | ICD-10-CM | POA: Diagnosis not present

## 2021-09-20 DIAGNOSIS — E43 Unspecified severe protein-calorie malnutrition: Secondary | ICD-10-CM | POA: Diagnosis not present

## 2021-09-20 DIAGNOSIS — I10 Essential (primary) hypertension: Secondary | ICD-10-CM | POA: Diagnosis not present

## 2021-09-20 DIAGNOSIS — E7849 Other hyperlipidemia: Secondary | ICD-10-CM | POA: Diagnosis not present

## 2021-09-20 DIAGNOSIS — M6281 Muscle weakness (generalized): Secondary | ICD-10-CM | POA: Diagnosis not present

## 2021-09-20 DIAGNOSIS — E538 Deficiency of other specified B group vitamins: Secondary | ICD-10-CM | POA: Diagnosis not present

## 2021-09-20 DIAGNOSIS — G20 Parkinson's disease: Secondary | ICD-10-CM | POA: Diagnosis not present

## 2021-09-23 DIAGNOSIS — I10 Essential (primary) hypertension: Secondary | ICD-10-CM | POA: Diagnosis not present

## 2021-09-23 DIAGNOSIS — E538 Deficiency of other specified B group vitamins: Secondary | ICD-10-CM | POA: Diagnosis not present

## 2021-09-23 DIAGNOSIS — G20 Parkinson's disease: Secondary | ICD-10-CM | POA: Diagnosis not present

## 2021-09-23 DIAGNOSIS — E43 Unspecified severe protein-calorie malnutrition: Secondary | ICD-10-CM | POA: Diagnosis not present

## 2021-09-23 DIAGNOSIS — M6281 Muscle weakness (generalized): Secondary | ICD-10-CM | POA: Diagnosis not present

## 2021-09-23 DIAGNOSIS — Z8616 Personal history of COVID-19: Secondary | ICD-10-CM | POA: Diagnosis not present

## 2021-09-23 DIAGNOSIS — E7849 Other hyperlipidemia: Secondary | ICD-10-CM | POA: Diagnosis not present

## 2021-09-24 DIAGNOSIS — E7849 Other hyperlipidemia: Secondary | ICD-10-CM | POA: Diagnosis not present

## 2021-09-24 DIAGNOSIS — G20 Parkinson's disease: Secondary | ICD-10-CM | POA: Diagnosis not present

## 2021-09-24 DIAGNOSIS — I10 Essential (primary) hypertension: Secondary | ICD-10-CM | POA: Diagnosis not present

## 2021-09-24 DIAGNOSIS — M6281 Muscle weakness (generalized): Secondary | ICD-10-CM | POA: Diagnosis not present

## 2021-09-24 DIAGNOSIS — Z8616 Personal history of COVID-19: Secondary | ICD-10-CM | POA: Diagnosis not present

## 2021-09-24 DIAGNOSIS — E538 Deficiency of other specified B group vitamins: Secondary | ICD-10-CM | POA: Diagnosis not present

## 2021-09-24 DIAGNOSIS — E43 Unspecified severe protein-calorie malnutrition: Secondary | ICD-10-CM | POA: Diagnosis not present

## 2021-09-25 DIAGNOSIS — E538 Deficiency of other specified B group vitamins: Secondary | ICD-10-CM | POA: Diagnosis not present

## 2021-09-25 DIAGNOSIS — G20 Parkinson's disease: Secondary | ICD-10-CM | POA: Diagnosis not present

## 2021-09-25 DIAGNOSIS — I10 Essential (primary) hypertension: Secondary | ICD-10-CM | POA: Diagnosis not present

## 2021-09-25 DIAGNOSIS — M6281 Muscle weakness (generalized): Secondary | ICD-10-CM | POA: Diagnosis not present

## 2021-09-25 DIAGNOSIS — Z8616 Personal history of COVID-19: Secondary | ICD-10-CM | POA: Diagnosis not present

## 2021-09-25 DIAGNOSIS — E7849 Other hyperlipidemia: Secondary | ICD-10-CM | POA: Diagnosis not present

## 2021-09-25 DIAGNOSIS — E43 Unspecified severe protein-calorie malnutrition: Secondary | ICD-10-CM | POA: Diagnosis not present

## 2021-09-26 DIAGNOSIS — K219 Gastro-esophageal reflux disease without esophagitis: Secondary | ICD-10-CM | POA: Diagnosis not present

## 2021-09-26 DIAGNOSIS — E43 Unspecified severe protein-calorie malnutrition: Secondary | ICD-10-CM | POA: Diagnosis not present

## 2021-09-26 DIAGNOSIS — Z8616 Personal history of COVID-19: Secondary | ICD-10-CM | POA: Diagnosis not present

## 2021-09-26 DIAGNOSIS — E7849 Other hyperlipidemia: Secondary | ICD-10-CM | POA: Diagnosis not present

## 2021-09-26 DIAGNOSIS — E538 Deficiency of other specified B group vitamins: Secondary | ICD-10-CM | POA: Diagnosis not present

## 2021-09-26 DIAGNOSIS — I10 Essential (primary) hypertension: Secondary | ICD-10-CM | POA: Diagnosis not present

## 2021-09-26 DIAGNOSIS — G20 Parkinson's disease: Secondary | ICD-10-CM | POA: Diagnosis not present

## 2021-09-26 DIAGNOSIS — M6281 Muscle weakness (generalized): Secondary | ICD-10-CM | POA: Diagnosis not present

## 2021-09-27 DIAGNOSIS — E43 Unspecified severe protein-calorie malnutrition: Secondary | ICD-10-CM | POA: Diagnosis not present

## 2021-09-27 DIAGNOSIS — M6281 Muscle weakness (generalized): Secondary | ICD-10-CM | POA: Diagnosis not present

## 2021-09-27 DIAGNOSIS — G20 Parkinson's disease: Secondary | ICD-10-CM | POA: Diagnosis not present

## 2021-09-27 DIAGNOSIS — I10 Essential (primary) hypertension: Secondary | ICD-10-CM | POA: Diagnosis not present

## 2021-09-27 DIAGNOSIS — E538 Deficiency of other specified B group vitamins: Secondary | ICD-10-CM | POA: Diagnosis not present

## 2021-09-27 DIAGNOSIS — E7849 Other hyperlipidemia: Secondary | ICD-10-CM | POA: Diagnosis not present

## 2021-09-27 DIAGNOSIS — Z8616 Personal history of COVID-19: Secondary | ICD-10-CM | POA: Diagnosis not present

## 2021-09-29 DIAGNOSIS — G20 Parkinson's disease: Secondary | ICD-10-CM | POA: Diagnosis not present

## 2021-09-29 DIAGNOSIS — K219 Gastro-esophageal reflux disease without esophagitis: Secondary | ICD-10-CM | POA: Diagnosis not present

## 2021-09-29 DIAGNOSIS — E43 Unspecified severe protein-calorie malnutrition: Secondary | ICD-10-CM | POA: Diagnosis not present

## 2021-09-29 DIAGNOSIS — I10 Essential (primary) hypertension: Secondary | ICD-10-CM | POA: Diagnosis not present

## 2021-09-30 DIAGNOSIS — M6281 Muscle weakness (generalized): Secondary | ICD-10-CM | POA: Diagnosis not present

## 2021-09-30 DIAGNOSIS — E7849 Other hyperlipidemia: Secondary | ICD-10-CM | POA: Diagnosis not present

## 2021-09-30 DIAGNOSIS — E43 Unspecified severe protein-calorie malnutrition: Secondary | ICD-10-CM | POA: Diagnosis not present

## 2021-09-30 DIAGNOSIS — Z8616 Personal history of COVID-19: Secondary | ICD-10-CM | POA: Diagnosis not present

## 2021-09-30 DIAGNOSIS — I10 Essential (primary) hypertension: Secondary | ICD-10-CM | POA: Diagnosis not present

## 2021-09-30 DIAGNOSIS — E538 Deficiency of other specified B group vitamins: Secondary | ICD-10-CM | POA: Diagnosis not present

## 2021-09-30 DIAGNOSIS — G20 Parkinson's disease: Secondary | ICD-10-CM | POA: Diagnosis not present

## 2021-10-01 DIAGNOSIS — I10 Essential (primary) hypertension: Secondary | ICD-10-CM | POA: Diagnosis not present

## 2021-10-01 DIAGNOSIS — Z8616 Personal history of COVID-19: Secondary | ICD-10-CM | POA: Diagnosis not present

## 2021-10-01 DIAGNOSIS — M6281 Muscle weakness (generalized): Secondary | ICD-10-CM | POA: Diagnosis not present

## 2021-10-01 DIAGNOSIS — G20 Parkinson's disease: Secondary | ICD-10-CM | POA: Diagnosis not present

## 2021-10-01 DIAGNOSIS — E7849 Other hyperlipidemia: Secondary | ICD-10-CM | POA: Diagnosis not present

## 2021-10-01 DIAGNOSIS — E43 Unspecified severe protein-calorie malnutrition: Secondary | ICD-10-CM | POA: Diagnosis not present

## 2021-10-01 DIAGNOSIS — E538 Deficiency of other specified B group vitamins: Secondary | ICD-10-CM | POA: Diagnosis not present

## 2021-10-02 DIAGNOSIS — M6281 Muscle weakness (generalized): Secondary | ICD-10-CM | POA: Diagnosis not present

## 2021-10-02 DIAGNOSIS — E538 Deficiency of other specified B group vitamins: Secondary | ICD-10-CM | POA: Diagnosis not present

## 2021-10-02 DIAGNOSIS — I10 Essential (primary) hypertension: Secondary | ICD-10-CM | POA: Diagnosis not present

## 2021-10-02 DIAGNOSIS — E43 Unspecified severe protein-calorie malnutrition: Secondary | ICD-10-CM | POA: Diagnosis not present

## 2021-10-02 DIAGNOSIS — G20 Parkinson's disease: Secondary | ICD-10-CM | POA: Diagnosis not present

## 2021-10-02 DIAGNOSIS — E7849 Other hyperlipidemia: Secondary | ICD-10-CM | POA: Diagnosis not present

## 2021-10-02 DIAGNOSIS — Z8616 Personal history of COVID-19: Secondary | ICD-10-CM | POA: Diagnosis not present

## 2021-10-03 DIAGNOSIS — G20 Parkinson's disease: Secondary | ICD-10-CM | POA: Diagnosis not present

## 2021-10-03 DIAGNOSIS — E7849 Other hyperlipidemia: Secondary | ICD-10-CM | POA: Diagnosis not present

## 2021-10-03 DIAGNOSIS — M6281 Muscle weakness (generalized): Secondary | ICD-10-CM | POA: Diagnosis not present

## 2021-10-03 DIAGNOSIS — E43 Unspecified severe protein-calorie malnutrition: Secondary | ICD-10-CM | POA: Diagnosis not present

## 2021-10-03 DIAGNOSIS — Z8616 Personal history of COVID-19: Secondary | ICD-10-CM | POA: Diagnosis not present

## 2021-10-03 DIAGNOSIS — I10 Essential (primary) hypertension: Secondary | ICD-10-CM | POA: Diagnosis not present

## 2021-10-03 DIAGNOSIS — E538 Deficiency of other specified B group vitamins: Secondary | ICD-10-CM | POA: Diagnosis not present

## 2021-10-04 DIAGNOSIS — G20 Parkinson's disease: Secondary | ICD-10-CM | POA: Diagnosis not present

## 2021-10-04 DIAGNOSIS — I639 Cerebral infarction, unspecified: Secondary | ICD-10-CM | POA: Diagnosis not present

## 2021-10-04 DIAGNOSIS — M6281 Muscle weakness (generalized): Secondary | ICD-10-CM | POA: Diagnosis not present

## 2021-10-04 DIAGNOSIS — E43 Unspecified severe protein-calorie malnutrition: Secondary | ICD-10-CM | POA: Diagnosis not present

## 2021-10-04 DIAGNOSIS — R269 Unspecified abnormalities of gait and mobility: Secondary | ICD-10-CM | POA: Diagnosis not present

## 2021-10-04 DIAGNOSIS — M6282 Rhabdomyolysis: Secondary | ICD-10-CM | POA: Diagnosis not present

## 2021-10-04 DIAGNOSIS — I5042 Chronic combined systolic (congestive) and diastolic (congestive) heart failure: Secondary | ICD-10-CM | POA: Diagnosis not present

## 2021-10-04 DIAGNOSIS — K219 Gastro-esophageal reflux disease without esophagitis: Secondary | ICD-10-CM | POA: Diagnosis not present

## 2021-10-04 DIAGNOSIS — E7849 Other hyperlipidemia: Secondary | ICD-10-CM | POA: Diagnosis not present

## 2021-10-04 DIAGNOSIS — I10 Essential (primary) hypertension: Secondary | ICD-10-CM | POA: Diagnosis not present

## 2021-10-04 DIAGNOSIS — E119 Type 2 diabetes mellitus without complications: Secondary | ICD-10-CM | POA: Diagnosis not present

## 2021-10-04 DIAGNOSIS — Z8616 Personal history of COVID-19: Secondary | ICD-10-CM | POA: Diagnosis not present

## 2021-10-04 DIAGNOSIS — E538 Deficiency of other specified B group vitamins: Secondary | ICD-10-CM | POA: Diagnosis not present

## 2021-10-04 DIAGNOSIS — E559 Vitamin D deficiency, unspecified: Secondary | ICD-10-CM | POA: Diagnosis not present

## 2021-10-05 DIAGNOSIS — Z8616 Personal history of COVID-19: Secondary | ICD-10-CM | POA: Diagnosis not present

## 2021-10-05 DIAGNOSIS — M6281 Muscle weakness (generalized): Secondary | ICD-10-CM | POA: Diagnosis not present

## 2021-10-05 DIAGNOSIS — E7849 Other hyperlipidemia: Secondary | ICD-10-CM | POA: Diagnosis not present

## 2021-10-05 DIAGNOSIS — G20 Parkinson's disease: Secondary | ICD-10-CM | POA: Diagnosis not present

## 2021-10-05 DIAGNOSIS — I10 Essential (primary) hypertension: Secondary | ICD-10-CM | POA: Diagnosis not present

## 2021-10-05 DIAGNOSIS — E43 Unspecified severe protein-calorie malnutrition: Secondary | ICD-10-CM | POA: Diagnosis not present

## 2021-10-05 DIAGNOSIS — E538 Deficiency of other specified B group vitamins: Secondary | ICD-10-CM | POA: Diagnosis not present

## 2021-10-07 DIAGNOSIS — M6281 Muscle weakness (generalized): Secondary | ICD-10-CM | POA: Diagnosis not present

## 2021-10-07 DIAGNOSIS — G20 Parkinson's disease: Secondary | ICD-10-CM | POA: Diagnosis not present

## 2021-10-07 DIAGNOSIS — I10 Essential (primary) hypertension: Secondary | ICD-10-CM | POA: Diagnosis not present

## 2021-10-07 DIAGNOSIS — E43 Unspecified severe protein-calorie malnutrition: Secondary | ICD-10-CM | POA: Diagnosis not present

## 2021-10-07 DIAGNOSIS — E7849 Other hyperlipidemia: Secondary | ICD-10-CM | POA: Diagnosis not present

## 2021-10-07 DIAGNOSIS — Z8616 Personal history of COVID-19: Secondary | ICD-10-CM | POA: Diagnosis not present

## 2021-10-07 DIAGNOSIS — E538 Deficiency of other specified B group vitamins: Secondary | ICD-10-CM | POA: Diagnosis not present

## 2021-10-08 ENCOUNTER — Ambulatory Visit: Payer: Medicare Other | Admitting: Family Medicine

## 2021-10-08 DIAGNOSIS — G20 Parkinson's disease: Secondary | ICD-10-CM | POA: Diagnosis not present

## 2021-10-08 DIAGNOSIS — E538 Deficiency of other specified B group vitamins: Secondary | ICD-10-CM | POA: Diagnosis not present

## 2021-10-08 DIAGNOSIS — Z8616 Personal history of COVID-19: Secondary | ICD-10-CM | POA: Diagnosis not present

## 2021-10-08 DIAGNOSIS — M6281 Muscle weakness (generalized): Secondary | ICD-10-CM | POA: Diagnosis not present

## 2021-10-08 DIAGNOSIS — F02B18 Dementia in other diseases classified elsewhere, moderate, with other behavioral disturbance: Secondary | ICD-10-CM | POA: Diagnosis not present

## 2021-10-08 DIAGNOSIS — E43 Unspecified severe protein-calorie malnutrition: Secondary | ICD-10-CM | POA: Diagnosis not present

## 2021-10-08 DIAGNOSIS — I10 Essential (primary) hypertension: Secondary | ICD-10-CM | POA: Diagnosis not present

## 2021-10-08 DIAGNOSIS — E7849 Other hyperlipidemia: Secondary | ICD-10-CM | POA: Diagnosis not present

## 2021-10-08 NOTE — Patient Instructions (Incomplete)
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
## 2021-10-09 DIAGNOSIS — I10 Essential (primary) hypertension: Secondary | ICD-10-CM | POA: Diagnosis not present

## 2021-10-09 DIAGNOSIS — G20 Parkinson's disease: Secondary | ICD-10-CM | POA: Diagnosis not present

## 2021-10-09 DIAGNOSIS — E538 Deficiency of other specified B group vitamins: Secondary | ICD-10-CM | POA: Diagnosis not present

## 2021-10-09 DIAGNOSIS — E43 Unspecified severe protein-calorie malnutrition: Secondary | ICD-10-CM | POA: Diagnosis not present

## 2021-10-09 DIAGNOSIS — Z8616 Personal history of COVID-19: Secondary | ICD-10-CM | POA: Diagnosis not present

## 2021-10-09 DIAGNOSIS — E039 Hypothyroidism, unspecified: Secondary | ICD-10-CM | POA: Diagnosis not present

## 2021-10-09 DIAGNOSIS — E7849 Other hyperlipidemia: Secondary | ICD-10-CM | POA: Diagnosis not present

## 2021-10-09 DIAGNOSIS — E559 Vitamin D deficiency, unspecified: Secondary | ICD-10-CM | POA: Diagnosis not present

## 2021-10-09 DIAGNOSIS — M6281 Muscle weakness (generalized): Secondary | ICD-10-CM | POA: Diagnosis not present

## 2021-10-10 DIAGNOSIS — E7849 Other hyperlipidemia: Secondary | ICD-10-CM | POA: Diagnosis not present

## 2021-10-10 DIAGNOSIS — G20 Parkinson's disease: Secondary | ICD-10-CM | POA: Diagnosis not present

## 2021-10-10 DIAGNOSIS — E43 Unspecified severe protein-calorie malnutrition: Secondary | ICD-10-CM | POA: Diagnosis not present

## 2021-10-10 DIAGNOSIS — Z8616 Personal history of COVID-19: Secondary | ICD-10-CM | POA: Diagnosis not present

## 2021-10-10 DIAGNOSIS — E538 Deficiency of other specified B group vitamins: Secondary | ICD-10-CM | POA: Diagnosis not present

## 2021-10-10 DIAGNOSIS — I10 Essential (primary) hypertension: Secondary | ICD-10-CM | POA: Diagnosis not present

## 2021-10-10 DIAGNOSIS — M6281 Muscle weakness (generalized): Secondary | ICD-10-CM | POA: Diagnosis not present

## 2021-10-11 DIAGNOSIS — E7849 Other hyperlipidemia: Secondary | ICD-10-CM | POA: Diagnosis not present

## 2021-10-11 DIAGNOSIS — I10 Essential (primary) hypertension: Secondary | ICD-10-CM | POA: Diagnosis not present

## 2021-10-11 DIAGNOSIS — E43 Unspecified severe protein-calorie malnutrition: Secondary | ICD-10-CM | POA: Diagnosis not present

## 2021-10-11 DIAGNOSIS — Z8616 Personal history of COVID-19: Secondary | ICD-10-CM | POA: Diagnosis not present

## 2021-10-11 DIAGNOSIS — G20 Parkinson's disease: Secondary | ICD-10-CM | POA: Diagnosis not present

## 2021-10-11 DIAGNOSIS — M6281 Muscle weakness (generalized): Secondary | ICD-10-CM | POA: Diagnosis not present

## 2021-10-11 DIAGNOSIS — E538 Deficiency of other specified B group vitamins: Secondary | ICD-10-CM | POA: Diagnosis not present

## 2021-10-14 DIAGNOSIS — I10 Essential (primary) hypertension: Secondary | ICD-10-CM | POA: Diagnosis not present

## 2021-10-14 DIAGNOSIS — E43 Unspecified severe protein-calorie malnutrition: Secondary | ICD-10-CM | POA: Diagnosis not present

## 2021-10-14 DIAGNOSIS — E538 Deficiency of other specified B group vitamins: Secondary | ICD-10-CM | POA: Diagnosis not present

## 2021-10-14 DIAGNOSIS — G20 Parkinson's disease: Secondary | ICD-10-CM | POA: Diagnosis not present

## 2021-10-14 DIAGNOSIS — E7849 Other hyperlipidemia: Secondary | ICD-10-CM | POA: Diagnosis not present

## 2021-10-14 DIAGNOSIS — M6281 Muscle weakness (generalized): Secondary | ICD-10-CM | POA: Diagnosis not present

## 2021-10-14 DIAGNOSIS — Z8616 Personal history of COVID-19: Secondary | ICD-10-CM | POA: Diagnosis not present

## 2021-10-15 DIAGNOSIS — E039 Hypothyroidism, unspecified: Secondary | ICD-10-CM | POA: Diagnosis not present

## 2021-10-15 DIAGNOSIS — E559 Vitamin D deficiency, unspecified: Secondary | ICD-10-CM | POA: Diagnosis not present

## 2021-10-16 DIAGNOSIS — E538 Deficiency of other specified B group vitamins: Secondary | ICD-10-CM | POA: Diagnosis not present

## 2021-10-16 DIAGNOSIS — M6281 Muscle weakness (generalized): Secondary | ICD-10-CM | POA: Diagnosis not present

## 2021-10-16 DIAGNOSIS — E7849 Other hyperlipidemia: Secondary | ICD-10-CM | POA: Diagnosis not present

## 2021-10-16 DIAGNOSIS — I10 Essential (primary) hypertension: Secondary | ICD-10-CM | POA: Diagnosis not present

## 2021-10-16 DIAGNOSIS — G20 Parkinson's disease: Secondary | ICD-10-CM | POA: Diagnosis not present

## 2021-10-16 DIAGNOSIS — E43 Unspecified severe protein-calorie malnutrition: Secondary | ICD-10-CM | POA: Diagnosis not present

## 2021-10-16 DIAGNOSIS — Z8616 Personal history of COVID-19: Secondary | ICD-10-CM | POA: Diagnosis not present

## 2021-10-17 DIAGNOSIS — G20 Parkinson's disease: Secondary | ICD-10-CM | POA: Diagnosis not present

## 2021-10-17 DIAGNOSIS — E7849 Other hyperlipidemia: Secondary | ICD-10-CM | POA: Diagnosis not present

## 2021-10-17 DIAGNOSIS — I10 Essential (primary) hypertension: Secondary | ICD-10-CM | POA: Diagnosis not present

## 2021-10-17 DIAGNOSIS — M6281 Muscle weakness (generalized): Secondary | ICD-10-CM | POA: Diagnosis not present

## 2021-10-17 DIAGNOSIS — Z8616 Personal history of COVID-19: Secondary | ICD-10-CM | POA: Diagnosis not present

## 2021-10-17 DIAGNOSIS — E538 Deficiency of other specified B group vitamins: Secondary | ICD-10-CM | POA: Diagnosis not present

## 2021-10-17 DIAGNOSIS — E43 Unspecified severe protein-calorie malnutrition: Secondary | ICD-10-CM | POA: Diagnosis not present

## 2021-10-18 DIAGNOSIS — M6281 Muscle weakness (generalized): Secondary | ICD-10-CM | POA: Diagnosis not present

## 2021-10-18 DIAGNOSIS — E7849 Other hyperlipidemia: Secondary | ICD-10-CM | POA: Diagnosis not present

## 2021-10-18 DIAGNOSIS — Z8616 Personal history of COVID-19: Secondary | ICD-10-CM | POA: Diagnosis not present

## 2021-10-18 DIAGNOSIS — E43 Unspecified severe protein-calorie malnutrition: Secondary | ICD-10-CM | POA: Diagnosis not present

## 2021-10-18 DIAGNOSIS — G20 Parkinson's disease: Secondary | ICD-10-CM | POA: Diagnosis not present

## 2021-10-18 DIAGNOSIS — I10 Essential (primary) hypertension: Secondary | ICD-10-CM | POA: Diagnosis not present

## 2021-10-18 DIAGNOSIS — E538 Deficiency of other specified B group vitamins: Secondary | ICD-10-CM | POA: Diagnosis not present

## 2021-10-21 DIAGNOSIS — E43 Unspecified severe protein-calorie malnutrition: Secondary | ICD-10-CM | POA: Diagnosis not present

## 2021-10-21 DIAGNOSIS — M6281 Muscle weakness (generalized): Secondary | ICD-10-CM | POA: Diagnosis not present

## 2021-10-21 DIAGNOSIS — I10 Essential (primary) hypertension: Secondary | ICD-10-CM | POA: Diagnosis not present

## 2021-10-21 DIAGNOSIS — E538 Deficiency of other specified B group vitamins: Secondary | ICD-10-CM | POA: Diagnosis not present

## 2021-10-21 DIAGNOSIS — Z8616 Personal history of COVID-19: Secondary | ICD-10-CM | POA: Diagnosis not present

## 2021-10-21 DIAGNOSIS — E7849 Other hyperlipidemia: Secondary | ICD-10-CM | POA: Diagnosis not present

## 2021-10-21 DIAGNOSIS — G20 Parkinson's disease: Secondary | ICD-10-CM | POA: Diagnosis not present

## 2021-10-22 DIAGNOSIS — K219 Gastro-esophageal reflux disease without esophagitis: Secondary | ICD-10-CM | POA: Diagnosis not present

## 2021-10-22 DIAGNOSIS — R296 Repeated falls: Secondary | ICD-10-CM | POA: Diagnosis not present

## 2021-10-22 DIAGNOSIS — M6281 Muscle weakness (generalized): Secondary | ICD-10-CM | POA: Diagnosis not present

## 2021-10-22 DIAGNOSIS — E43 Unspecified severe protein-calorie malnutrition: Secondary | ICD-10-CM | POA: Diagnosis not present

## 2021-10-22 DIAGNOSIS — Z8616 Personal history of COVID-19: Secondary | ICD-10-CM | POA: Diagnosis not present

## 2021-10-22 DIAGNOSIS — G20 Parkinson's disease: Secondary | ICD-10-CM | POA: Diagnosis not present

## 2021-10-22 DIAGNOSIS — I10 Essential (primary) hypertension: Secondary | ICD-10-CM | POA: Diagnosis not present

## 2021-10-22 DIAGNOSIS — E538 Deficiency of other specified B group vitamins: Secondary | ICD-10-CM | POA: Diagnosis not present

## 2021-10-22 DIAGNOSIS — E7849 Other hyperlipidemia: Secondary | ICD-10-CM | POA: Diagnosis not present

## 2021-10-23 DIAGNOSIS — E43 Unspecified severe protein-calorie malnutrition: Secondary | ICD-10-CM | POA: Diagnosis not present

## 2021-10-23 DIAGNOSIS — G20 Parkinson's disease: Secondary | ICD-10-CM | POA: Diagnosis not present

## 2021-10-23 DIAGNOSIS — Z8616 Personal history of COVID-19: Secondary | ICD-10-CM | POA: Diagnosis not present

## 2021-10-23 DIAGNOSIS — M79642 Pain in left hand: Secondary | ICD-10-CM | POA: Diagnosis not present

## 2021-10-23 DIAGNOSIS — I10 Essential (primary) hypertension: Secondary | ICD-10-CM | POA: Diagnosis not present

## 2021-10-23 DIAGNOSIS — M6281 Muscle weakness (generalized): Secondary | ICD-10-CM | POA: Diagnosis not present

## 2021-10-23 DIAGNOSIS — E538 Deficiency of other specified B group vitamins: Secondary | ICD-10-CM | POA: Diagnosis not present

## 2021-10-23 DIAGNOSIS — E7849 Other hyperlipidemia: Secondary | ICD-10-CM | POA: Diagnosis not present

## 2021-10-24 DIAGNOSIS — E43 Unspecified severe protein-calorie malnutrition: Secondary | ICD-10-CM | POA: Diagnosis not present

## 2021-10-24 DIAGNOSIS — E538 Deficiency of other specified B group vitamins: Secondary | ICD-10-CM | POA: Diagnosis not present

## 2021-10-24 DIAGNOSIS — R451 Restlessness and agitation: Secondary | ICD-10-CM | POA: Diagnosis not present

## 2021-10-24 DIAGNOSIS — E7849 Other hyperlipidemia: Secondary | ICD-10-CM | POA: Diagnosis not present

## 2021-10-24 DIAGNOSIS — I10 Essential (primary) hypertension: Secondary | ICD-10-CM | POA: Diagnosis not present

## 2021-10-24 DIAGNOSIS — M6281 Muscle weakness (generalized): Secondary | ICD-10-CM | POA: Diagnosis not present

## 2021-10-24 DIAGNOSIS — G20 Parkinson's disease: Secondary | ICD-10-CM | POA: Diagnosis not present

## 2021-10-24 DIAGNOSIS — Z8616 Personal history of COVID-19: Secondary | ICD-10-CM | POA: Diagnosis not present

## 2021-10-25 DIAGNOSIS — I10 Essential (primary) hypertension: Secondary | ICD-10-CM | POA: Diagnosis not present

## 2021-10-25 DIAGNOSIS — Z8616 Personal history of COVID-19: Secondary | ICD-10-CM | POA: Diagnosis not present

## 2021-10-25 DIAGNOSIS — G20 Parkinson's disease: Secondary | ICD-10-CM | POA: Diagnosis not present

## 2021-10-25 DIAGNOSIS — K219 Gastro-esophageal reflux disease without esophagitis: Secondary | ICD-10-CM | POA: Diagnosis not present

## 2021-10-25 DIAGNOSIS — M6281 Muscle weakness (generalized): Secondary | ICD-10-CM | POA: Diagnosis not present

## 2021-10-25 DIAGNOSIS — R21 Rash and other nonspecific skin eruption: Secondary | ICD-10-CM | POA: Diagnosis not present

## 2021-10-25 DIAGNOSIS — E43 Unspecified severe protein-calorie malnutrition: Secondary | ICD-10-CM | POA: Diagnosis not present

## 2021-10-25 DIAGNOSIS — E7849 Other hyperlipidemia: Secondary | ICD-10-CM | POA: Diagnosis not present

## 2021-10-25 DIAGNOSIS — E538 Deficiency of other specified B group vitamins: Secondary | ICD-10-CM | POA: Diagnosis not present

## 2021-10-28 DIAGNOSIS — M6281 Muscle weakness (generalized): Secondary | ICD-10-CM | POA: Diagnosis not present

## 2021-10-28 DIAGNOSIS — G20 Parkinson's disease: Secondary | ICD-10-CM | POA: Diagnosis not present

## 2021-10-28 DIAGNOSIS — E7849 Other hyperlipidemia: Secondary | ICD-10-CM | POA: Diagnosis not present

## 2021-10-28 DIAGNOSIS — Z8616 Personal history of COVID-19: Secondary | ICD-10-CM | POA: Diagnosis not present

## 2021-10-28 DIAGNOSIS — E538 Deficiency of other specified B group vitamins: Secondary | ICD-10-CM | POA: Diagnosis not present

## 2021-10-28 DIAGNOSIS — E43 Unspecified severe protein-calorie malnutrition: Secondary | ICD-10-CM | POA: Diagnosis not present

## 2021-10-28 DIAGNOSIS — I10 Essential (primary) hypertension: Secondary | ICD-10-CM | POA: Diagnosis not present

## 2021-10-29 DIAGNOSIS — M6281 Muscle weakness (generalized): Secondary | ICD-10-CM | POA: Diagnosis not present

## 2021-10-29 DIAGNOSIS — E43 Unspecified severe protein-calorie malnutrition: Secondary | ICD-10-CM | POA: Diagnosis not present

## 2021-10-29 DIAGNOSIS — E7849 Other hyperlipidemia: Secondary | ICD-10-CM | POA: Diagnosis not present

## 2021-10-29 DIAGNOSIS — I10 Essential (primary) hypertension: Secondary | ICD-10-CM | POA: Diagnosis not present

## 2021-10-29 DIAGNOSIS — E538 Deficiency of other specified B group vitamins: Secondary | ICD-10-CM | POA: Diagnosis not present

## 2021-10-29 DIAGNOSIS — G20 Parkinson's disease: Secondary | ICD-10-CM | POA: Diagnosis not present

## 2021-10-29 DIAGNOSIS — Z8616 Personal history of COVID-19: Secondary | ICD-10-CM | POA: Diagnosis not present

## 2021-10-30 DIAGNOSIS — E538 Deficiency of other specified B group vitamins: Secondary | ICD-10-CM | POA: Diagnosis not present

## 2021-10-30 DIAGNOSIS — G20 Parkinson's disease: Secondary | ICD-10-CM | POA: Diagnosis not present

## 2021-10-30 DIAGNOSIS — E43 Unspecified severe protein-calorie malnutrition: Secondary | ICD-10-CM | POA: Diagnosis not present

## 2021-10-30 DIAGNOSIS — Z8616 Personal history of COVID-19: Secondary | ICD-10-CM | POA: Diagnosis not present

## 2021-10-30 DIAGNOSIS — E7849 Other hyperlipidemia: Secondary | ICD-10-CM | POA: Diagnosis not present

## 2021-10-30 DIAGNOSIS — K219 Gastro-esophageal reflux disease without esophagitis: Secondary | ICD-10-CM | POA: Diagnosis not present

## 2021-10-30 DIAGNOSIS — M6281 Muscle weakness (generalized): Secondary | ICD-10-CM | POA: Diagnosis not present

## 2021-10-30 DIAGNOSIS — I10 Essential (primary) hypertension: Secondary | ICD-10-CM | POA: Diagnosis not present

## 2021-10-31 DIAGNOSIS — E7849 Other hyperlipidemia: Secondary | ICD-10-CM | POA: Diagnosis not present

## 2021-10-31 DIAGNOSIS — I10 Essential (primary) hypertension: Secondary | ICD-10-CM | POA: Diagnosis not present

## 2021-10-31 DIAGNOSIS — E538 Deficiency of other specified B group vitamins: Secondary | ICD-10-CM | POA: Diagnosis not present

## 2021-10-31 DIAGNOSIS — E43 Unspecified severe protein-calorie malnutrition: Secondary | ICD-10-CM | POA: Diagnosis not present

## 2021-10-31 DIAGNOSIS — Z8616 Personal history of COVID-19: Secondary | ICD-10-CM | POA: Diagnosis not present

## 2021-10-31 DIAGNOSIS — G20 Parkinson's disease: Secondary | ICD-10-CM | POA: Diagnosis not present

## 2021-10-31 DIAGNOSIS — M6281 Muscle weakness (generalized): Secondary | ICD-10-CM | POA: Diagnosis not present

## 2021-11-03 DIAGNOSIS — G20 Parkinson's disease: Secondary | ICD-10-CM | POA: Diagnosis not present

## 2021-11-03 DIAGNOSIS — M6281 Muscle weakness (generalized): Secondary | ICD-10-CM | POA: Diagnosis not present

## 2021-11-03 DIAGNOSIS — E43 Unspecified severe protein-calorie malnutrition: Secondary | ICD-10-CM | POA: Diagnosis not present

## 2021-11-03 DIAGNOSIS — Z8616 Personal history of COVID-19: Secondary | ICD-10-CM | POA: Diagnosis not present

## 2021-11-03 DIAGNOSIS — E7849 Other hyperlipidemia: Secondary | ICD-10-CM | POA: Diagnosis not present

## 2021-11-03 DIAGNOSIS — E538 Deficiency of other specified B group vitamins: Secondary | ICD-10-CM | POA: Diagnosis not present

## 2021-11-03 DIAGNOSIS — I10 Essential (primary) hypertension: Secondary | ICD-10-CM | POA: Diagnosis not present

## 2021-11-04 DIAGNOSIS — E538 Deficiency of other specified B group vitamins: Secondary | ICD-10-CM | POA: Diagnosis not present

## 2021-11-04 DIAGNOSIS — E43 Unspecified severe protein-calorie malnutrition: Secondary | ICD-10-CM | POA: Diagnosis not present

## 2021-11-04 DIAGNOSIS — M6281 Muscle weakness (generalized): Secondary | ICD-10-CM | POA: Diagnosis not present

## 2021-11-04 DIAGNOSIS — I10 Essential (primary) hypertension: Secondary | ICD-10-CM | POA: Diagnosis not present

## 2021-11-04 DIAGNOSIS — E7849 Other hyperlipidemia: Secondary | ICD-10-CM | POA: Diagnosis not present

## 2021-11-04 DIAGNOSIS — Z8616 Personal history of COVID-19: Secondary | ICD-10-CM | POA: Diagnosis not present

## 2021-11-04 DIAGNOSIS — G20 Parkinson's disease: Secondary | ICD-10-CM | POA: Diagnosis not present

## 2021-11-05 DIAGNOSIS — E538 Deficiency of other specified B group vitamins: Secondary | ICD-10-CM | POA: Diagnosis not present

## 2021-11-05 DIAGNOSIS — I10 Essential (primary) hypertension: Secondary | ICD-10-CM | POA: Diagnosis not present

## 2021-11-05 DIAGNOSIS — E7849 Other hyperlipidemia: Secondary | ICD-10-CM | POA: Diagnosis not present

## 2021-11-05 DIAGNOSIS — F02B18 Dementia in other diseases classified elsewhere, moderate, with other behavioral disturbance: Secondary | ICD-10-CM | POA: Diagnosis not present

## 2021-11-05 DIAGNOSIS — Z8616 Personal history of COVID-19: Secondary | ICD-10-CM | POA: Diagnosis not present

## 2021-11-05 DIAGNOSIS — G20 Parkinson's disease: Secondary | ICD-10-CM | POA: Diagnosis not present

## 2021-11-05 DIAGNOSIS — M6281 Muscle weakness (generalized): Secondary | ICD-10-CM | POA: Diagnosis not present

## 2021-11-05 DIAGNOSIS — E43 Unspecified severe protein-calorie malnutrition: Secondary | ICD-10-CM | POA: Diagnosis not present

## 2021-11-06 DIAGNOSIS — Z8616 Personal history of COVID-19: Secondary | ICD-10-CM | POA: Diagnosis not present

## 2021-11-06 DIAGNOSIS — E538 Deficiency of other specified B group vitamins: Secondary | ICD-10-CM | POA: Diagnosis not present

## 2021-11-06 DIAGNOSIS — I10 Essential (primary) hypertension: Secondary | ICD-10-CM | POA: Diagnosis not present

## 2021-11-06 DIAGNOSIS — E7849 Other hyperlipidemia: Secondary | ICD-10-CM | POA: Diagnosis not present

## 2021-11-06 DIAGNOSIS — E43 Unspecified severe protein-calorie malnutrition: Secondary | ICD-10-CM | POA: Diagnosis not present

## 2021-11-06 DIAGNOSIS — M6281 Muscle weakness (generalized): Secondary | ICD-10-CM | POA: Diagnosis not present

## 2021-11-06 DIAGNOSIS — G20 Parkinson's disease: Secondary | ICD-10-CM | POA: Diagnosis not present

## 2021-11-07 DIAGNOSIS — Z8616 Personal history of COVID-19: Secondary | ICD-10-CM | POA: Diagnosis not present

## 2021-11-07 DIAGNOSIS — E538 Deficiency of other specified B group vitamins: Secondary | ICD-10-CM | POA: Diagnosis not present

## 2021-11-07 DIAGNOSIS — E43 Unspecified severe protein-calorie malnutrition: Secondary | ICD-10-CM | POA: Diagnosis not present

## 2021-11-07 DIAGNOSIS — E7849 Other hyperlipidemia: Secondary | ICD-10-CM | POA: Diagnosis not present

## 2021-11-07 DIAGNOSIS — M6281 Muscle weakness (generalized): Secondary | ICD-10-CM | POA: Diagnosis not present

## 2021-11-07 DIAGNOSIS — I10 Essential (primary) hypertension: Secondary | ICD-10-CM | POA: Diagnosis not present

## 2021-11-07 DIAGNOSIS — G20 Parkinson's disease: Secondary | ICD-10-CM | POA: Diagnosis not present

## 2021-11-08 DIAGNOSIS — M6281 Muscle weakness (generalized): Secondary | ICD-10-CM | POA: Diagnosis not present

## 2021-11-08 DIAGNOSIS — E538 Deficiency of other specified B group vitamins: Secondary | ICD-10-CM | POA: Diagnosis not present

## 2021-11-08 DIAGNOSIS — I10 Essential (primary) hypertension: Secondary | ICD-10-CM | POA: Diagnosis not present

## 2021-11-08 DIAGNOSIS — E43 Unspecified severe protein-calorie malnutrition: Secondary | ICD-10-CM | POA: Diagnosis not present

## 2021-11-08 DIAGNOSIS — G20 Parkinson's disease: Secondary | ICD-10-CM | POA: Diagnosis not present

## 2021-11-08 DIAGNOSIS — E7849 Other hyperlipidemia: Secondary | ICD-10-CM | POA: Diagnosis not present

## 2021-11-08 DIAGNOSIS — Z8616 Personal history of COVID-19: Secondary | ICD-10-CM | POA: Diagnosis not present

## 2021-11-10 DIAGNOSIS — K219 Gastro-esophageal reflux disease without esophagitis: Secondary | ICD-10-CM | POA: Diagnosis not present

## 2021-11-10 DIAGNOSIS — G20 Parkinson's disease: Secondary | ICD-10-CM | POA: Diagnosis not present

## 2021-11-10 DIAGNOSIS — M6281 Muscle weakness (generalized): Secondary | ICD-10-CM | POA: Diagnosis not present

## 2021-11-10 DIAGNOSIS — K5909 Other constipation: Secondary | ICD-10-CM | POA: Diagnosis not present

## 2021-11-10 DIAGNOSIS — E43 Unspecified severe protein-calorie malnutrition: Secondary | ICD-10-CM | POA: Diagnosis not present

## 2021-11-10 DIAGNOSIS — I10 Essential (primary) hypertension: Secondary | ICD-10-CM | POA: Diagnosis not present

## 2021-11-10 DIAGNOSIS — E538 Deficiency of other specified B group vitamins: Secondary | ICD-10-CM | POA: Diagnosis not present

## 2021-11-10 DIAGNOSIS — Z8616 Personal history of COVID-19: Secondary | ICD-10-CM | POA: Diagnosis not present

## 2021-11-10 DIAGNOSIS — E7849 Other hyperlipidemia: Secondary | ICD-10-CM | POA: Diagnosis not present

## 2021-11-11 DIAGNOSIS — G20 Parkinson's disease: Secondary | ICD-10-CM | POA: Diagnosis not present

## 2021-11-11 DIAGNOSIS — Z8616 Personal history of COVID-19: Secondary | ICD-10-CM | POA: Diagnosis not present

## 2021-11-11 DIAGNOSIS — E538 Deficiency of other specified B group vitamins: Secondary | ICD-10-CM | POA: Diagnosis not present

## 2021-11-11 DIAGNOSIS — E43 Unspecified severe protein-calorie malnutrition: Secondary | ICD-10-CM | POA: Diagnosis not present

## 2021-11-11 DIAGNOSIS — E7849 Other hyperlipidemia: Secondary | ICD-10-CM | POA: Diagnosis not present

## 2021-11-11 DIAGNOSIS — M6281 Muscle weakness (generalized): Secondary | ICD-10-CM | POA: Diagnosis not present

## 2021-11-11 DIAGNOSIS — I10 Essential (primary) hypertension: Secondary | ICD-10-CM | POA: Diagnosis not present

## 2021-11-12 DIAGNOSIS — M6281 Muscle weakness (generalized): Secondary | ICD-10-CM | POA: Diagnosis not present

## 2021-11-12 DIAGNOSIS — E7849 Other hyperlipidemia: Secondary | ICD-10-CM | POA: Diagnosis not present

## 2021-11-12 DIAGNOSIS — G20 Parkinson's disease: Secondary | ICD-10-CM | POA: Diagnosis not present

## 2021-11-12 DIAGNOSIS — E538 Deficiency of other specified B group vitamins: Secondary | ICD-10-CM | POA: Diagnosis not present

## 2021-11-12 DIAGNOSIS — E43 Unspecified severe protein-calorie malnutrition: Secondary | ICD-10-CM | POA: Diagnosis not present

## 2021-11-12 DIAGNOSIS — I10 Essential (primary) hypertension: Secondary | ICD-10-CM | POA: Diagnosis not present

## 2021-11-12 DIAGNOSIS — Z8616 Personal history of COVID-19: Secondary | ICD-10-CM | POA: Diagnosis not present

## 2021-11-13 DIAGNOSIS — E7849 Other hyperlipidemia: Secondary | ICD-10-CM | POA: Diagnosis not present

## 2021-11-13 DIAGNOSIS — M6281 Muscle weakness (generalized): Secondary | ICD-10-CM | POA: Diagnosis not present

## 2021-11-13 DIAGNOSIS — G20 Parkinson's disease: Secondary | ICD-10-CM | POA: Diagnosis not present

## 2021-11-13 DIAGNOSIS — E43 Unspecified severe protein-calorie malnutrition: Secondary | ICD-10-CM | POA: Diagnosis not present

## 2021-11-13 DIAGNOSIS — Z8616 Personal history of COVID-19: Secondary | ICD-10-CM | POA: Diagnosis not present

## 2021-11-13 DIAGNOSIS — I10 Essential (primary) hypertension: Secondary | ICD-10-CM | POA: Diagnosis not present

## 2021-11-13 DIAGNOSIS — E538 Deficiency of other specified B group vitamins: Secondary | ICD-10-CM | POA: Diagnosis not present

## 2021-11-14 DIAGNOSIS — E538 Deficiency of other specified B group vitamins: Secondary | ICD-10-CM | POA: Diagnosis not present

## 2021-11-14 DIAGNOSIS — Z8616 Personal history of COVID-19: Secondary | ICD-10-CM | POA: Diagnosis not present

## 2021-11-14 DIAGNOSIS — I10 Essential (primary) hypertension: Secondary | ICD-10-CM | POA: Diagnosis not present

## 2021-11-14 DIAGNOSIS — E43 Unspecified severe protein-calorie malnutrition: Secondary | ICD-10-CM | POA: Diagnosis not present

## 2021-11-14 DIAGNOSIS — M6281 Muscle weakness (generalized): Secondary | ICD-10-CM | POA: Diagnosis not present

## 2021-11-14 DIAGNOSIS — E7849 Other hyperlipidemia: Secondary | ICD-10-CM | POA: Diagnosis not present

## 2021-11-14 DIAGNOSIS — G20 Parkinson's disease: Secondary | ICD-10-CM | POA: Diagnosis not present

## 2021-11-15 DIAGNOSIS — G20 Parkinson's disease: Secondary | ICD-10-CM | POA: Diagnosis not present

## 2021-11-15 DIAGNOSIS — Z8616 Personal history of COVID-19: Secondary | ICD-10-CM | POA: Diagnosis not present

## 2021-11-15 DIAGNOSIS — E7849 Other hyperlipidemia: Secondary | ICD-10-CM | POA: Diagnosis not present

## 2021-11-15 DIAGNOSIS — I10 Essential (primary) hypertension: Secondary | ICD-10-CM | POA: Diagnosis not present

## 2021-11-15 DIAGNOSIS — E538 Deficiency of other specified B group vitamins: Secondary | ICD-10-CM | POA: Diagnosis not present

## 2021-11-15 DIAGNOSIS — M6281 Muscle weakness (generalized): Secondary | ICD-10-CM | POA: Diagnosis not present

## 2021-11-15 DIAGNOSIS — E43 Unspecified severe protein-calorie malnutrition: Secondary | ICD-10-CM | POA: Diagnosis not present

## 2021-11-16 DIAGNOSIS — K219 Gastro-esophageal reflux disease without esophagitis: Secondary | ICD-10-CM | POA: Diagnosis not present

## 2021-11-16 DIAGNOSIS — I10 Essential (primary) hypertension: Secondary | ICD-10-CM | POA: Diagnosis not present

## 2021-11-16 DIAGNOSIS — G20 Parkinson's disease: Secondary | ICD-10-CM | POA: Diagnosis not present

## 2021-11-18 DIAGNOSIS — E538 Deficiency of other specified B group vitamins: Secondary | ICD-10-CM | POA: Diagnosis not present

## 2021-11-18 DIAGNOSIS — Z8616 Personal history of COVID-19: Secondary | ICD-10-CM | POA: Diagnosis not present

## 2021-11-18 DIAGNOSIS — M6281 Muscle weakness (generalized): Secondary | ICD-10-CM | POA: Diagnosis not present

## 2021-11-18 DIAGNOSIS — E7849 Other hyperlipidemia: Secondary | ICD-10-CM | POA: Diagnosis not present

## 2021-11-18 DIAGNOSIS — G20 Parkinson's disease: Secondary | ICD-10-CM | POA: Diagnosis not present

## 2021-11-18 DIAGNOSIS — I10 Essential (primary) hypertension: Secondary | ICD-10-CM | POA: Diagnosis not present

## 2021-11-18 DIAGNOSIS — E43 Unspecified severe protein-calorie malnutrition: Secondary | ICD-10-CM | POA: Diagnosis not present

## 2021-11-19 DIAGNOSIS — E538 Deficiency of other specified B group vitamins: Secondary | ICD-10-CM | POA: Diagnosis not present

## 2021-11-19 DIAGNOSIS — E7849 Other hyperlipidemia: Secondary | ICD-10-CM | POA: Diagnosis not present

## 2021-11-19 DIAGNOSIS — Z8616 Personal history of COVID-19: Secondary | ICD-10-CM | POA: Diagnosis not present

## 2021-11-19 DIAGNOSIS — G20 Parkinson's disease: Secondary | ICD-10-CM | POA: Diagnosis not present

## 2021-11-19 DIAGNOSIS — E43 Unspecified severe protein-calorie malnutrition: Secondary | ICD-10-CM | POA: Diagnosis not present

## 2021-11-19 DIAGNOSIS — I10 Essential (primary) hypertension: Secondary | ICD-10-CM | POA: Diagnosis not present

## 2021-11-19 DIAGNOSIS — M6281 Muscle weakness (generalized): Secondary | ICD-10-CM | POA: Diagnosis not present

## 2021-11-20 DIAGNOSIS — G20 Parkinson's disease: Secondary | ICD-10-CM | POA: Diagnosis not present

## 2021-11-20 DIAGNOSIS — I10 Essential (primary) hypertension: Secondary | ICD-10-CM | POA: Diagnosis not present

## 2021-11-20 DIAGNOSIS — E538 Deficiency of other specified B group vitamins: Secondary | ICD-10-CM | POA: Diagnosis not present

## 2021-11-20 DIAGNOSIS — E43 Unspecified severe protein-calorie malnutrition: Secondary | ICD-10-CM | POA: Diagnosis not present

## 2021-11-20 DIAGNOSIS — Z8616 Personal history of COVID-19: Secondary | ICD-10-CM | POA: Diagnosis not present

## 2021-11-20 DIAGNOSIS — M6281 Muscle weakness (generalized): Secondary | ICD-10-CM | POA: Diagnosis not present

## 2021-11-20 DIAGNOSIS — E7849 Other hyperlipidemia: Secondary | ICD-10-CM | POA: Diagnosis not present

## 2021-11-21 DIAGNOSIS — I10 Essential (primary) hypertension: Secondary | ICD-10-CM | POA: Diagnosis not present

## 2021-11-21 DIAGNOSIS — M6281 Muscle weakness (generalized): Secondary | ICD-10-CM | POA: Diagnosis not present

## 2021-11-21 DIAGNOSIS — Z8616 Personal history of COVID-19: Secondary | ICD-10-CM | POA: Diagnosis not present

## 2021-11-21 DIAGNOSIS — E43 Unspecified severe protein-calorie malnutrition: Secondary | ICD-10-CM | POA: Diagnosis not present

## 2021-11-21 DIAGNOSIS — E7849 Other hyperlipidemia: Secondary | ICD-10-CM | POA: Diagnosis not present

## 2021-11-21 DIAGNOSIS — E538 Deficiency of other specified B group vitamins: Secondary | ICD-10-CM | POA: Diagnosis not present

## 2021-11-21 DIAGNOSIS — G20 Parkinson's disease: Secondary | ICD-10-CM | POA: Diagnosis not present

## 2021-11-22 DIAGNOSIS — I10 Essential (primary) hypertension: Secondary | ICD-10-CM | POA: Diagnosis not present

## 2021-11-22 DIAGNOSIS — R269 Unspecified abnormalities of gait and mobility: Secondary | ICD-10-CM | POA: Diagnosis not present

## 2021-11-22 DIAGNOSIS — M6282 Rhabdomyolysis: Secondary | ICD-10-CM | POA: Diagnosis not present

## 2021-11-22 DIAGNOSIS — Z8616 Personal history of COVID-19: Secondary | ICD-10-CM | POA: Diagnosis not present

## 2021-11-22 DIAGNOSIS — E7849 Other hyperlipidemia: Secondary | ICD-10-CM | POA: Diagnosis not present

## 2021-11-22 DIAGNOSIS — E538 Deficiency of other specified B group vitamins: Secondary | ICD-10-CM | POA: Diagnosis not present

## 2021-11-22 DIAGNOSIS — E43 Unspecified severe protein-calorie malnutrition: Secondary | ICD-10-CM | POA: Diagnosis not present

## 2021-11-22 DIAGNOSIS — M6281 Muscle weakness (generalized): Secondary | ICD-10-CM | POA: Diagnosis not present

## 2021-11-22 DIAGNOSIS — G20 Parkinson's disease: Secondary | ICD-10-CM | POA: Diagnosis not present

## 2021-11-22 DIAGNOSIS — I639 Cerebral infarction, unspecified: Secondary | ICD-10-CM | POA: Diagnosis not present

## 2021-11-23 DIAGNOSIS — I10 Essential (primary) hypertension: Secondary | ICD-10-CM | POA: Diagnosis not present

## 2021-11-23 DIAGNOSIS — Z8616 Personal history of COVID-19: Secondary | ICD-10-CM | POA: Diagnosis not present

## 2021-11-23 DIAGNOSIS — M6281 Muscle weakness (generalized): Secondary | ICD-10-CM | POA: Diagnosis not present

## 2021-11-23 DIAGNOSIS — E538 Deficiency of other specified B group vitamins: Secondary | ICD-10-CM | POA: Diagnosis not present

## 2021-11-23 DIAGNOSIS — E43 Unspecified severe protein-calorie malnutrition: Secondary | ICD-10-CM | POA: Diagnosis not present

## 2021-11-23 DIAGNOSIS — E7849 Other hyperlipidemia: Secondary | ICD-10-CM | POA: Diagnosis not present

## 2021-11-23 DIAGNOSIS — G20 Parkinson's disease: Secondary | ICD-10-CM | POA: Diagnosis not present

## 2021-11-24 DIAGNOSIS — E7849 Other hyperlipidemia: Secondary | ICD-10-CM | POA: Diagnosis not present

## 2021-11-24 DIAGNOSIS — M6281 Muscle weakness (generalized): Secondary | ICD-10-CM | POA: Diagnosis not present

## 2021-11-24 DIAGNOSIS — G20 Parkinson's disease: Secondary | ICD-10-CM | POA: Diagnosis not present

## 2021-11-24 DIAGNOSIS — E43 Unspecified severe protein-calorie malnutrition: Secondary | ICD-10-CM | POA: Diagnosis not present

## 2021-11-24 DIAGNOSIS — Z8616 Personal history of COVID-19: Secondary | ICD-10-CM | POA: Diagnosis not present

## 2021-11-24 DIAGNOSIS — I10 Essential (primary) hypertension: Secondary | ICD-10-CM | POA: Diagnosis not present

## 2021-11-24 DIAGNOSIS — E538 Deficiency of other specified B group vitamins: Secondary | ICD-10-CM | POA: Diagnosis not present

## 2021-11-25 DIAGNOSIS — G20 Parkinson's disease: Secondary | ICD-10-CM | POA: Diagnosis not present

## 2021-11-25 DIAGNOSIS — E7849 Other hyperlipidemia: Secondary | ICD-10-CM | POA: Diagnosis not present

## 2021-11-25 DIAGNOSIS — Z8616 Personal history of COVID-19: Secondary | ICD-10-CM | POA: Diagnosis not present

## 2021-11-25 DIAGNOSIS — E43 Unspecified severe protein-calorie malnutrition: Secondary | ICD-10-CM | POA: Diagnosis not present

## 2021-11-25 DIAGNOSIS — M6281 Muscle weakness (generalized): Secondary | ICD-10-CM | POA: Diagnosis not present

## 2021-11-25 DIAGNOSIS — I10 Essential (primary) hypertension: Secondary | ICD-10-CM | POA: Diagnosis not present

## 2021-11-25 DIAGNOSIS — E538 Deficiency of other specified B group vitamins: Secondary | ICD-10-CM | POA: Diagnosis not present

## 2021-11-26 DIAGNOSIS — E43 Unspecified severe protein-calorie malnutrition: Secondary | ICD-10-CM | POA: Diagnosis not present

## 2021-11-26 DIAGNOSIS — Z8616 Personal history of COVID-19: Secondary | ICD-10-CM | POA: Diagnosis not present

## 2021-11-26 DIAGNOSIS — M6281 Muscle weakness (generalized): Secondary | ICD-10-CM | POA: Diagnosis not present

## 2021-11-26 DIAGNOSIS — K219 Gastro-esophageal reflux disease without esophagitis: Secondary | ICD-10-CM | POA: Diagnosis not present

## 2021-11-26 DIAGNOSIS — I10 Essential (primary) hypertension: Secondary | ICD-10-CM | POA: Diagnosis not present

## 2021-11-26 DIAGNOSIS — G20 Parkinson's disease: Secondary | ICD-10-CM | POA: Diagnosis not present

## 2021-11-26 DIAGNOSIS — E7849 Other hyperlipidemia: Secondary | ICD-10-CM | POA: Diagnosis not present

## 2021-11-26 DIAGNOSIS — E538 Deficiency of other specified B group vitamins: Secondary | ICD-10-CM | POA: Diagnosis not present

## 2021-11-27 DIAGNOSIS — G20 Parkinson's disease: Secondary | ICD-10-CM | POA: Diagnosis not present

## 2021-11-27 DIAGNOSIS — E43 Unspecified severe protein-calorie malnutrition: Secondary | ICD-10-CM | POA: Diagnosis not present

## 2021-11-27 DIAGNOSIS — M6281 Muscle weakness (generalized): Secondary | ICD-10-CM | POA: Diagnosis not present

## 2021-11-27 DIAGNOSIS — E7849 Other hyperlipidemia: Secondary | ICD-10-CM | POA: Diagnosis not present

## 2021-11-27 DIAGNOSIS — I10 Essential (primary) hypertension: Secondary | ICD-10-CM | POA: Diagnosis not present

## 2021-11-27 DIAGNOSIS — Z8616 Personal history of COVID-19: Secondary | ICD-10-CM | POA: Diagnosis not present

## 2021-11-27 DIAGNOSIS — E538 Deficiency of other specified B group vitamins: Secondary | ICD-10-CM | POA: Diagnosis not present

## 2021-11-28 DIAGNOSIS — I10 Essential (primary) hypertension: Secondary | ICD-10-CM | POA: Diagnosis not present

## 2021-11-28 DIAGNOSIS — E538 Deficiency of other specified B group vitamins: Secondary | ICD-10-CM | POA: Diagnosis not present

## 2021-11-28 DIAGNOSIS — G20 Parkinson's disease: Secondary | ICD-10-CM | POA: Diagnosis not present

## 2021-11-28 DIAGNOSIS — M6281 Muscle weakness (generalized): Secondary | ICD-10-CM | POA: Diagnosis not present

## 2021-11-28 DIAGNOSIS — E7849 Other hyperlipidemia: Secondary | ICD-10-CM | POA: Diagnosis not present

## 2021-11-28 DIAGNOSIS — Z8616 Personal history of COVID-19: Secondary | ICD-10-CM | POA: Diagnosis not present

## 2021-11-28 DIAGNOSIS — E43 Unspecified severe protein-calorie malnutrition: Secondary | ICD-10-CM | POA: Diagnosis not present

## 2021-11-29 DIAGNOSIS — E7849 Other hyperlipidemia: Secondary | ICD-10-CM | POA: Diagnosis not present

## 2021-11-29 DIAGNOSIS — E538 Deficiency of other specified B group vitamins: Secondary | ICD-10-CM | POA: Diagnosis not present

## 2021-11-29 DIAGNOSIS — I10 Essential (primary) hypertension: Secondary | ICD-10-CM | POA: Diagnosis not present

## 2021-11-29 DIAGNOSIS — E43 Unspecified severe protein-calorie malnutrition: Secondary | ICD-10-CM | POA: Diagnosis not present

## 2021-11-29 DIAGNOSIS — M6281 Muscle weakness (generalized): Secondary | ICD-10-CM | POA: Diagnosis not present

## 2021-11-29 DIAGNOSIS — G20 Parkinson's disease: Secondary | ICD-10-CM | POA: Diagnosis not present

## 2021-11-29 DIAGNOSIS — Z8616 Personal history of COVID-19: Secondary | ICD-10-CM | POA: Diagnosis not present

## 2021-11-29 DIAGNOSIS — K219 Gastro-esophageal reflux disease without esophagitis: Secondary | ICD-10-CM | POA: Diagnosis not present

## 2021-12-01 DIAGNOSIS — G20 Parkinson's disease: Secondary | ICD-10-CM | POA: Diagnosis not present

## 2021-12-01 DIAGNOSIS — E7849 Other hyperlipidemia: Secondary | ICD-10-CM | POA: Diagnosis not present

## 2021-12-01 DIAGNOSIS — M6281 Muscle weakness (generalized): Secondary | ICD-10-CM | POA: Diagnosis not present

## 2021-12-01 DIAGNOSIS — Z8616 Personal history of COVID-19: Secondary | ICD-10-CM | POA: Diagnosis not present

## 2021-12-01 DIAGNOSIS — E43 Unspecified severe protein-calorie malnutrition: Secondary | ICD-10-CM | POA: Diagnosis not present

## 2021-12-01 DIAGNOSIS — E538 Deficiency of other specified B group vitamins: Secondary | ICD-10-CM | POA: Diagnosis not present

## 2021-12-01 DIAGNOSIS — I10 Essential (primary) hypertension: Secondary | ICD-10-CM | POA: Diagnosis not present

## 2021-12-02 DIAGNOSIS — M6281 Muscle weakness (generalized): Secondary | ICD-10-CM | POA: Diagnosis not present

## 2021-12-02 DIAGNOSIS — E538 Deficiency of other specified B group vitamins: Secondary | ICD-10-CM | POA: Diagnosis not present

## 2021-12-02 DIAGNOSIS — E7849 Other hyperlipidemia: Secondary | ICD-10-CM | POA: Diagnosis not present

## 2021-12-02 DIAGNOSIS — Z8616 Personal history of COVID-19: Secondary | ICD-10-CM | POA: Diagnosis not present

## 2021-12-02 DIAGNOSIS — I10 Essential (primary) hypertension: Secondary | ICD-10-CM | POA: Diagnosis not present

## 2021-12-02 DIAGNOSIS — E43 Unspecified severe protein-calorie malnutrition: Secondary | ICD-10-CM | POA: Diagnosis not present

## 2021-12-02 DIAGNOSIS — G20 Parkinson's disease: Secondary | ICD-10-CM | POA: Diagnosis not present

## 2021-12-03 DIAGNOSIS — E43 Unspecified severe protein-calorie malnutrition: Secondary | ICD-10-CM | POA: Diagnosis not present

## 2021-12-03 DIAGNOSIS — E538 Deficiency of other specified B group vitamins: Secondary | ICD-10-CM | POA: Diagnosis not present

## 2021-12-03 DIAGNOSIS — G20 Parkinson's disease: Secondary | ICD-10-CM | POA: Diagnosis not present

## 2021-12-03 DIAGNOSIS — F02B18 Dementia in other diseases classified elsewhere, moderate, with other behavioral disturbance: Secondary | ICD-10-CM | POA: Diagnosis not present

## 2021-12-03 DIAGNOSIS — M6281 Muscle weakness (generalized): Secondary | ICD-10-CM | POA: Diagnosis not present

## 2021-12-03 DIAGNOSIS — I10 Essential (primary) hypertension: Secondary | ICD-10-CM | POA: Diagnosis not present

## 2021-12-03 DIAGNOSIS — Z8616 Personal history of COVID-19: Secondary | ICD-10-CM | POA: Diagnosis not present

## 2021-12-03 DIAGNOSIS — E7849 Other hyperlipidemia: Secondary | ICD-10-CM | POA: Diagnosis not present

## 2021-12-04 DIAGNOSIS — M6281 Muscle weakness (generalized): Secondary | ICD-10-CM | POA: Diagnosis not present

## 2021-12-04 DIAGNOSIS — E538 Deficiency of other specified B group vitamins: Secondary | ICD-10-CM | POA: Diagnosis not present

## 2021-12-04 DIAGNOSIS — I10 Essential (primary) hypertension: Secondary | ICD-10-CM | POA: Diagnosis not present

## 2021-12-04 DIAGNOSIS — E43 Unspecified severe protein-calorie malnutrition: Secondary | ICD-10-CM | POA: Diagnosis not present

## 2021-12-04 DIAGNOSIS — E7849 Other hyperlipidemia: Secondary | ICD-10-CM | POA: Diagnosis not present

## 2021-12-04 DIAGNOSIS — Z8616 Personal history of COVID-19: Secondary | ICD-10-CM | POA: Diagnosis not present

## 2021-12-04 DIAGNOSIS — G20 Parkinson's disease: Secondary | ICD-10-CM | POA: Diagnosis not present

## 2021-12-05 DIAGNOSIS — I10 Essential (primary) hypertension: Secondary | ICD-10-CM | POA: Diagnosis not present

## 2021-12-05 DIAGNOSIS — Z8616 Personal history of COVID-19: Secondary | ICD-10-CM | POA: Diagnosis not present

## 2021-12-05 DIAGNOSIS — E43 Unspecified severe protein-calorie malnutrition: Secondary | ICD-10-CM | POA: Diagnosis not present

## 2021-12-05 DIAGNOSIS — M6281 Muscle weakness (generalized): Secondary | ICD-10-CM | POA: Diagnosis not present

## 2021-12-05 DIAGNOSIS — E538 Deficiency of other specified B group vitamins: Secondary | ICD-10-CM | POA: Diagnosis not present

## 2021-12-05 DIAGNOSIS — G20 Parkinson's disease: Secondary | ICD-10-CM | POA: Diagnosis not present

## 2021-12-05 DIAGNOSIS — E7849 Other hyperlipidemia: Secondary | ICD-10-CM | POA: Diagnosis not present

## 2021-12-07 DIAGNOSIS — G20 Parkinson's disease: Secondary | ICD-10-CM | POA: Diagnosis not present

## 2021-12-07 DIAGNOSIS — K5909 Other constipation: Secondary | ICD-10-CM | POA: Diagnosis not present

## 2021-12-07 DIAGNOSIS — E43 Unspecified severe protein-calorie malnutrition: Secondary | ICD-10-CM | POA: Diagnosis not present

## 2021-12-07 DIAGNOSIS — Z8616 Personal history of COVID-19: Secondary | ICD-10-CM | POA: Diagnosis not present

## 2021-12-07 DIAGNOSIS — I10 Essential (primary) hypertension: Secondary | ICD-10-CM | POA: Diagnosis not present

## 2021-12-07 DIAGNOSIS — E7849 Other hyperlipidemia: Secondary | ICD-10-CM | POA: Diagnosis not present

## 2021-12-07 DIAGNOSIS — K219 Gastro-esophageal reflux disease without esophagitis: Secondary | ICD-10-CM | POA: Diagnosis not present

## 2021-12-07 DIAGNOSIS — E538 Deficiency of other specified B group vitamins: Secondary | ICD-10-CM | POA: Diagnosis not present

## 2021-12-07 DIAGNOSIS — M6281 Muscle weakness (generalized): Secondary | ICD-10-CM | POA: Diagnosis not present

## 2021-12-09 DIAGNOSIS — E538 Deficiency of other specified B group vitamins: Secondary | ICD-10-CM | POA: Diagnosis not present

## 2021-12-09 DIAGNOSIS — E43 Unspecified severe protein-calorie malnutrition: Secondary | ICD-10-CM | POA: Diagnosis not present

## 2021-12-09 DIAGNOSIS — I10 Essential (primary) hypertension: Secondary | ICD-10-CM | POA: Diagnosis not present

## 2021-12-09 DIAGNOSIS — G20 Parkinson's disease: Secondary | ICD-10-CM | POA: Diagnosis not present

## 2021-12-09 DIAGNOSIS — E7849 Other hyperlipidemia: Secondary | ICD-10-CM | POA: Diagnosis not present

## 2021-12-09 DIAGNOSIS — Z8616 Personal history of COVID-19: Secondary | ICD-10-CM | POA: Diagnosis not present

## 2021-12-09 DIAGNOSIS — M6281 Muscle weakness (generalized): Secondary | ICD-10-CM | POA: Diagnosis not present

## 2021-12-10 DIAGNOSIS — E7849 Other hyperlipidemia: Secondary | ICD-10-CM | POA: Diagnosis not present

## 2021-12-10 DIAGNOSIS — E538 Deficiency of other specified B group vitamins: Secondary | ICD-10-CM | POA: Diagnosis not present

## 2021-12-10 DIAGNOSIS — I10 Essential (primary) hypertension: Secondary | ICD-10-CM | POA: Diagnosis not present

## 2021-12-10 DIAGNOSIS — Z8616 Personal history of COVID-19: Secondary | ICD-10-CM | POA: Diagnosis not present

## 2021-12-10 DIAGNOSIS — G20 Parkinson's disease: Secondary | ICD-10-CM | POA: Diagnosis not present

## 2021-12-10 DIAGNOSIS — M6281 Muscle weakness (generalized): Secondary | ICD-10-CM | POA: Diagnosis not present

## 2021-12-10 DIAGNOSIS — E43 Unspecified severe protein-calorie malnutrition: Secondary | ICD-10-CM | POA: Diagnosis not present

## 2021-12-11 DIAGNOSIS — K219 Gastro-esophageal reflux disease without esophagitis: Secondary | ICD-10-CM | POA: Diagnosis not present

## 2021-12-11 DIAGNOSIS — I10 Essential (primary) hypertension: Secondary | ICD-10-CM | POA: Diagnosis not present

## 2021-12-11 DIAGNOSIS — M6281 Muscle weakness (generalized): Secondary | ICD-10-CM | POA: Diagnosis not present

## 2021-12-11 DIAGNOSIS — Z8616 Personal history of COVID-19: Secondary | ICD-10-CM | POA: Diagnosis not present

## 2021-12-11 DIAGNOSIS — G20 Parkinson's disease: Secondary | ICD-10-CM | POA: Diagnosis not present

## 2021-12-11 DIAGNOSIS — E43 Unspecified severe protein-calorie malnutrition: Secondary | ICD-10-CM | POA: Diagnosis not present

## 2021-12-11 DIAGNOSIS — E7849 Other hyperlipidemia: Secondary | ICD-10-CM | POA: Diagnosis not present

## 2021-12-11 DIAGNOSIS — E538 Deficiency of other specified B group vitamins: Secondary | ICD-10-CM | POA: Diagnosis not present

## 2021-12-13 DIAGNOSIS — E43 Unspecified severe protein-calorie malnutrition: Secondary | ICD-10-CM | POA: Diagnosis not present

## 2021-12-13 DIAGNOSIS — I10 Essential (primary) hypertension: Secondary | ICD-10-CM | POA: Diagnosis not present

## 2021-12-13 DIAGNOSIS — R269 Unspecified abnormalities of gait and mobility: Secondary | ICD-10-CM | POA: Diagnosis not present

## 2021-12-13 DIAGNOSIS — I639 Cerebral infarction, unspecified: Secondary | ICD-10-CM | POA: Diagnosis not present

## 2021-12-13 DIAGNOSIS — G20 Parkinson's disease: Secondary | ICD-10-CM | POA: Diagnosis not present

## 2021-12-13 DIAGNOSIS — M6281 Muscle weakness (generalized): Secondary | ICD-10-CM | POA: Diagnosis not present

## 2021-12-13 DIAGNOSIS — E538 Deficiency of other specified B group vitamins: Secondary | ICD-10-CM | POA: Diagnosis not present

## 2021-12-13 DIAGNOSIS — E7849 Other hyperlipidemia: Secondary | ICD-10-CM | POA: Diagnosis not present

## 2021-12-13 DIAGNOSIS — M6282 Rhabdomyolysis: Secondary | ICD-10-CM | POA: Diagnosis not present

## 2021-12-13 DIAGNOSIS — Z8616 Personal history of COVID-19: Secondary | ICD-10-CM | POA: Diagnosis not present

## 2021-12-17 DIAGNOSIS — Z79899 Other long term (current) drug therapy: Secondary | ICD-10-CM | POA: Diagnosis not present

## 2021-12-17 DIAGNOSIS — I10 Essential (primary) hypertension: Secondary | ICD-10-CM | POA: Diagnosis not present

## 2021-12-17 DIAGNOSIS — G20 Parkinson's disease: Secondary | ICD-10-CM | POA: Diagnosis not present

## 2021-12-17 DIAGNOSIS — M6281 Muscle weakness (generalized): Secondary | ICD-10-CM | POA: Diagnosis not present

## 2021-12-17 DIAGNOSIS — E43 Unspecified severe protein-calorie malnutrition: Secondary | ICD-10-CM | POA: Diagnosis not present

## 2021-12-17 DIAGNOSIS — E7849 Other hyperlipidemia: Secondary | ICD-10-CM | POA: Diagnosis not present

## 2021-12-17 DIAGNOSIS — Z8616 Personal history of COVID-19: Secondary | ICD-10-CM | POA: Diagnosis not present

## 2021-12-17 DIAGNOSIS — E538 Deficiency of other specified B group vitamins: Secondary | ICD-10-CM | POA: Diagnosis not present

## 2021-12-18 DIAGNOSIS — E538 Deficiency of other specified B group vitamins: Secondary | ICD-10-CM | POA: Diagnosis not present

## 2021-12-18 DIAGNOSIS — I10 Essential (primary) hypertension: Secondary | ICD-10-CM | POA: Diagnosis not present

## 2021-12-18 DIAGNOSIS — M6281 Muscle weakness (generalized): Secondary | ICD-10-CM | POA: Diagnosis not present

## 2021-12-18 DIAGNOSIS — Z8616 Personal history of COVID-19: Secondary | ICD-10-CM | POA: Diagnosis not present

## 2021-12-18 DIAGNOSIS — E7849 Other hyperlipidemia: Secondary | ICD-10-CM | POA: Diagnosis not present

## 2021-12-18 DIAGNOSIS — G20 Parkinson's disease: Secondary | ICD-10-CM | POA: Diagnosis not present

## 2021-12-18 DIAGNOSIS — E43 Unspecified severe protein-calorie malnutrition: Secondary | ICD-10-CM | POA: Diagnosis not present

## 2021-12-19 DIAGNOSIS — B351 Tinea unguium: Secondary | ICD-10-CM | POA: Diagnosis not present

## 2021-12-19 DIAGNOSIS — I739 Peripheral vascular disease, unspecified: Secondary | ICD-10-CM | POA: Diagnosis not present

## 2021-12-21 DIAGNOSIS — Z8616 Personal history of COVID-19: Secondary | ICD-10-CM | POA: Diagnosis not present

## 2021-12-21 DIAGNOSIS — E538 Deficiency of other specified B group vitamins: Secondary | ICD-10-CM | POA: Diagnosis not present

## 2021-12-21 DIAGNOSIS — E7849 Other hyperlipidemia: Secondary | ICD-10-CM | POA: Diagnosis not present

## 2021-12-21 DIAGNOSIS — E43 Unspecified severe protein-calorie malnutrition: Secondary | ICD-10-CM | POA: Diagnosis not present

## 2021-12-21 DIAGNOSIS — G20 Parkinson's disease: Secondary | ICD-10-CM | POA: Diagnosis not present

## 2021-12-21 DIAGNOSIS — M6281 Muscle weakness (generalized): Secondary | ICD-10-CM | POA: Diagnosis not present

## 2021-12-21 DIAGNOSIS — I10 Essential (primary) hypertension: Secondary | ICD-10-CM | POA: Diagnosis not present

## 2021-12-23 DIAGNOSIS — E7849 Other hyperlipidemia: Secondary | ICD-10-CM | POA: Diagnosis not present

## 2021-12-23 DIAGNOSIS — G20 Parkinson's disease: Secondary | ICD-10-CM | POA: Diagnosis not present

## 2021-12-23 DIAGNOSIS — Z8616 Personal history of COVID-19: Secondary | ICD-10-CM | POA: Diagnosis not present

## 2021-12-23 DIAGNOSIS — I10 Essential (primary) hypertension: Secondary | ICD-10-CM | POA: Diagnosis not present

## 2021-12-23 DIAGNOSIS — E43 Unspecified severe protein-calorie malnutrition: Secondary | ICD-10-CM | POA: Diagnosis not present

## 2021-12-23 DIAGNOSIS — M6281 Muscle weakness (generalized): Secondary | ICD-10-CM | POA: Diagnosis not present

## 2021-12-23 DIAGNOSIS — E538 Deficiency of other specified B group vitamins: Secondary | ICD-10-CM | POA: Diagnosis not present

## 2021-12-24 DIAGNOSIS — M6281 Muscle weakness (generalized): Secondary | ICD-10-CM | POA: Diagnosis not present

## 2021-12-24 DIAGNOSIS — G20 Parkinson's disease: Secondary | ICD-10-CM | POA: Diagnosis not present

## 2021-12-24 DIAGNOSIS — I10 Essential (primary) hypertension: Secondary | ICD-10-CM | POA: Diagnosis not present

## 2021-12-24 DIAGNOSIS — E7849 Other hyperlipidemia: Secondary | ICD-10-CM | POA: Diagnosis not present

## 2021-12-24 DIAGNOSIS — E538 Deficiency of other specified B group vitamins: Secondary | ICD-10-CM | POA: Diagnosis not present

## 2021-12-24 DIAGNOSIS — E43 Unspecified severe protein-calorie malnutrition: Secondary | ICD-10-CM | POA: Diagnosis not present

## 2021-12-24 DIAGNOSIS — Z8616 Personal history of COVID-19: Secondary | ICD-10-CM | POA: Diagnosis not present

## 2021-12-25 DIAGNOSIS — E538 Deficiency of other specified B group vitamins: Secondary | ICD-10-CM | POA: Diagnosis not present

## 2021-12-25 DIAGNOSIS — I10 Essential (primary) hypertension: Secondary | ICD-10-CM | POA: Diagnosis not present

## 2021-12-25 DIAGNOSIS — M6281 Muscle weakness (generalized): Secondary | ICD-10-CM | POA: Diagnosis not present

## 2021-12-25 DIAGNOSIS — G20 Parkinson's disease: Secondary | ICD-10-CM | POA: Diagnosis not present

## 2021-12-25 DIAGNOSIS — Z8616 Personal history of COVID-19: Secondary | ICD-10-CM | POA: Diagnosis not present

## 2021-12-25 DIAGNOSIS — E43 Unspecified severe protein-calorie malnutrition: Secondary | ICD-10-CM | POA: Diagnosis not present

## 2021-12-25 DIAGNOSIS — E7849 Other hyperlipidemia: Secondary | ICD-10-CM | POA: Diagnosis not present

## 2021-12-25 DIAGNOSIS — K219 Gastro-esophageal reflux disease without esophagitis: Secondary | ICD-10-CM | POA: Diagnosis not present

## 2021-12-27 DIAGNOSIS — E43 Unspecified severe protein-calorie malnutrition: Secondary | ICD-10-CM | POA: Diagnosis not present

## 2021-12-27 DIAGNOSIS — M6281 Muscle weakness (generalized): Secondary | ICD-10-CM | POA: Diagnosis not present

## 2021-12-27 DIAGNOSIS — G20 Parkinson's disease: Secondary | ICD-10-CM | POA: Diagnosis not present

## 2021-12-27 DIAGNOSIS — E7849 Other hyperlipidemia: Secondary | ICD-10-CM | POA: Diagnosis not present

## 2021-12-27 DIAGNOSIS — Z8616 Personal history of COVID-19: Secondary | ICD-10-CM | POA: Diagnosis not present

## 2021-12-27 DIAGNOSIS — E538 Deficiency of other specified B group vitamins: Secondary | ICD-10-CM | POA: Diagnosis not present

## 2021-12-27 DIAGNOSIS — I10 Essential (primary) hypertension: Secondary | ICD-10-CM | POA: Diagnosis not present

## 2021-12-30 ENCOUNTER — Encounter (HOSPITAL_BASED_OUTPATIENT_CLINIC_OR_DEPARTMENT_OTHER): Payer: Self-pay

## 2021-12-30 ENCOUNTER — Emergency Department (HOSPITAL_BASED_OUTPATIENT_CLINIC_OR_DEPARTMENT_OTHER)
Admission: EM | Admit: 2021-12-30 | Discharge: 2021-12-31 | Disposition: A | Discharge status: Home / Self Care | Payer: Medicare Other | Attending: Emergency Medicine | Admitting: Emergency Medicine

## 2021-12-30 ENCOUNTER — Emergency Department (HOSPITAL_BASED_OUTPATIENT_CLINIC_OR_DEPARTMENT_OTHER): Payer: Medicare Other

## 2021-12-30 ENCOUNTER — Other Ambulatory Visit: Payer: Self-pay

## 2021-12-30 DIAGNOSIS — S32038A Other fracture of third lumbar vertebra, initial encounter for closed fracture: Secondary | ICD-10-CM | POA: Diagnosis not present

## 2021-12-30 DIAGNOSIS — M545 Low back pain, unspecified: Secondary | ICD-10-CM | POA: Diagnosis not present

## 2021-12-30 DIAGNOSIS — R519 Headache, unspecified: Secondary | ICD-10-CM | POA: Diagnosis not present

## 2021-12-30 DIAGNOSIS — W01198A Fall on same level from slipping, tripping and stumbling with subsequent striking against other object, initial encounter: Secondary | ICD-10-CM | POA: Diagnosis not present

## 2021-12-30 DIAGNOSIS — Z8616 Personal history of COVID-19: Secondary | ICD-10-CM | POA: Diagnosis not present

## 2021-12-30 DIAGNOSIS — S32010A Wedge compression fracture of first lumbar vertebra, initial encounter for closed fracture: Secondary | ICD-10-CM | POA: Diagnosis not present

## 2021-12-30 DIAGNOSIS — S34109A Unspecified injury to unspecified level of lumbar spinal cord, initial encounter: Secondary | ICD-10-CM | POA: Diagnosis present

## 2021-12-30 DIAGNOSIS — E7849 Other hyperlipidemia: Secondary | ICD-10-CM | POA: Diagnosis not present

## 2021-12-30 DIAGNOSIS — S0990XA Unspecified injury of head, initial encounter: Secondary | ICD-10-CM | POA: Diagnosis not present

## 2021-12-30 DIAGNOSIS — E538 Deficiency of other specified B group vitamins: Secondary | ICD-10-CM | POA: Diagnosis not present

## 2021-12-30 DIAGNOSIS — I10 Essential (primary) hypertension: Secondary | ICD-10-CM | POA: Diagnosis not present

## 2021-12-30 DIAGNOSIS — E43 Unspecified severe protein-calorie malnutrition: Secondary | ICD-10-CM | POA: Diagnosis not present

## 2021-12-30 DIAGNOSIS — S32009A Unspecified fracture of unspecified lumbar vertebra, initial encounter for closed fracture: Secondary | ICD-10-CM

## 2021-12-30 DIAGNOSIS — W19XXXA Unspecified fall, initial encounter: Secondary | ICD-10-CM | POA: Diagnosis not present

## 2021-12-30 DIAGNOSIS — M6281 Muscle weakness (generalized): Secondary | ICD-10-CM | POA: Diagnosis not present

## 2021-12-30 DIAGNOSIS — G20 Parkinson's disease: Secondary | ICD-10-CM | POA: Diagnosis not present

## 2021-12-30 DIAGNOSIS — R55 Syncope and collapse: Secondary | ICD-10-CM | POA: Diagnosis not present

## 2021-12-30 DIAGNOSIS — Z743 Need for continuous supervision: Secondary | ICD-10-CM | POA: Diagnosis not present

## 2021-12-30 NOTE — ED Triage Notes (Signed)
Patient fell into door and struck the back of his head.  Complains with headache.  Awake and alert with some confusion at baseline.  No loss of consciousness and not on blood thinners. ?

## 2021-12-30 NOTE — Discharge Instructions (Addendum)
Your CT of the lumbar spine revealed a subacute transverse process fracture which requires no current intervention. It can be follow-up up in clinic for a reassessment.  ?

## 2021-12-30 NOTE — ED Provider Notes (Signed)
?[facility] EMERGENCY DEPT ?Provider Note ? ? ?CSN: [id number] ?Arrival date & time: 12/30/21  2008 ? ?  ? ?History ? ?Chief Complaint  ?Patient presents with  ? Fall  ? ? ?Kevin Gould is a 76 y.o. male who presents to the ED complaining of fall onset prior to arrival.  Patient notes that he had a mechanical fall where he struck the back of his head on the door. Hasn't tried any medications for his symptoms.  Patient notes that he had a headache earlier however his headache is resolved at this time.  Denies headache, vision changes, dizziness, lightheadedness, LOC, vomiting, neck pain, back pain.  Denies anticoagulant use. ? ?The history is provided by the patient. No language interpreter was used.  ? ?  ? ?Home Medications ?Prior to Admission medications   ?Medication Sig Start Date End Date Taking? Authorizing Provider  ?acetaminophen (TYLENOL) 500 MG tablet Take 500-1,000 mg by mouth See admin instructions. Take 1,000 mg by mouth two times a day and 500 mg every six hours as needed for pain    [provider]  ?carbidopa-levodopa (PARCOPA) 25-100 MG disintegrating tablet Take 2 tablets by mouth See admin instructions. Take 3 tablets by mouth twice daily for Parkinson at 9am and 1pm, then take 2 tablets by mouth at bedtime for parkinson    [provider]  ?Cholecalciferol (VITAMIN D3) 50 MCG (2000 UT) TABS Take 2,000 Units by mouth daily.    [provider]  ?escitalopram (LEXAPRO) 5 MG tablet Take 5 mg by mouth daily.    [provider]  ?famotidine (PEPCID) 20 MG tablet Take 20 mg by mouth 2 (two) times daily.    [provider]  ?feeding supplement, ENSURE ENLIVE, (ENSURE ENLIVE) LIQD Take 237 mLs by mouth 2 (two) times daily between meals. ?Patient not taking: Reported on 05/22/2021 08/22/19   Pennie Banter, DO  ?Infant Care Products [facility]) OINT Apply 1 application topically See admin instructions. Apply to buttocks for redness     [provider]  ?lidocaine (LIDODERM) 5 % Place 1 patch onto the skin See admin instructions. Apply 1 patch to lower back once a day and remove & discard patch within 12 hours or as directed by MD    [provider]  ?Multiple Vitamin (MULTIVITAMIN WITH MINERALS) TABS tablet Take 1 tablet by mouth daily. 08/23/19   Pennie Banter, DO  ?senna (SENOKOT) 8.6 MG tablet Take 1 tablet by mouth daily.    [provider]  ?   ? ?Allergies    ?Patient has no known allergies.   ? ?Review of Systems   ?Review of Systems  ?Eyes:  Negative for visual disturbance.  ?Gastrointestinal:  Negative for nausea and vomiting.  ?Musculoskeletal:  Negative for back pain and neck pain.  ?Neurological:  Positive for headaches (Resolved). Negative for dizziness, syncope and light-headedness.  ?All other systems reviewed and are negative. ? ?Physical Exam ?Updated Vital Signs ?BP 140/88   Pulse 64   Temp 98.4 ?F (36.9 ?C) (Oral)   Resp 18   Ht 5\' 9"  (1.753 m)   Wt 57.6 kg   SpO2 96%   BMI 18.75 kg/m?  ?Physical Exam ?Vitals and nursing note reviewed.  ?Constitutional:   ?   General: He is not in acute distress. ?   Appearance: He is not diaphoretic.  ?HENT:  ?   Head: Normocephalic and atraumatic.  ?   Right Ear: Tympanic membrane, ear canal and external  ear normal.  ?   Left Ear: Tympanic membrane, ear canal and external ear normal.  ?   Mouth/Throat:  ?   Mouth: Mucous membranes are moist.  ?   Pharynx: Oropharynx is clear. Uvula midline. No pharyngeal swelling, oropharyngeal exudate, posterior oropharyngeal erythema or uvula swelling.  ?Eyes:  ?   General: No scleral icterus. ?   Extraocular Movements: Extraocular movements intact.  ?   Conjunctiva/sclera: Conjunctivae normal.  ?   Pupils: Pupils are equal, round, and reactive to light.  ?Cardiovascular:  ?   Rate and Rhythm: Normal rate and regular rhythm.  ?   Pulses: Normal pulses.  ?   Heart sounds: Normal heart sounds.  ?Pulmonary:  ?   Effort:  Pulmonary effort is normal. No respiratory distress.  ?   Breath sounds: Normal breath sounds. No wheezing.  ?Abdominal:  ?   General: Bowel sounds are normal.  ?   Palpations: Abdomen is soft. There is no mass.  ?   Tenderness: There is no abdominal tenderness. There is no guarding or rebound.  ?Musculoskeletal:     ?   General: Normal range of motion.  ?   Cervical back: Full passive range of motion without pain, normal range of motion and neck supple.  ?   Comments: TTP noted to lumbar spine.  No overlying skin changes.  No tenderness to palpation noted to cervical, thoracic spine.  No tenderness to palpation noted to musculature of back.  ?Skin: ?   General: Skin is warm and dry.  ?Neurological:  ?   General: No focal deficit present.  ?   Mental Status: He is alert.  ?   Cranial Nerves: Cranial nerves 2-12 are intact.  ?   Sensory: Sensation is intact.  ?   Motor: No pronator drift.  ?Psychiatric:     ?   Behavior: Behavior normal.  ? ? ?ED Results / Procedures / Treatments   ?Labs ?(all labs ordered are listed, but only abnormal results are displayed) ?Labs Reviewed - No data to display ? ?EKG ?None ? ?Radiology ?No results found. ? ?Procedures ?Procedures  ? ? ?Medications Ordered in ED ?Medications - No data to display ? ?ED Course/ Medical Decision Making/ A&P ?  ? ?                        ?Medical Decision Making ?Amount and/or Complexity of Data Reviewed ?Radiology: ordered. ? ? ?Pt with fall occurring PTA. Denies hitting their head, LOC, headache, vision changes. Vital signs stable, patient afebrile, not tachycardic or hypoxic. On exam, patient with mild tenderness to palpation noted to lumbar spine without overlying skin changes.  No tenderness to palpation noted to cervical or thoracic spine.  No tenderness to palpation noted to musculature of the back.  Patient was not full active range of motion of neck.  No focal neurological deficits. No acute cardiovascular, respiratory, or abdominal exam  findings. Differential diagnosis includes intracranial abnormality, fracture, dislocation, herniation, strain. ? ? ?Imaging: ?I ordered imaging studies including CT lumbar spine, CT cervical spine, CT lumbar spine ordered with results pending at time of sign out. ? ? ?Patient case discussed with Dr. Karene Fry, at sign-out. Plan at sign-out is pending CT scans and pain control. Patient care transferred at sign out.  ? ?This chart was dictated using voice recognition software, Dragon. Despite the best efforts of this provider to proofread and correct errors, errors may still occur which can change  documentation meaning. ? ?Final Clinical Impression(s) / ED Diagnoses ?Final diagnoses:  ?Fall, initial encounter  ? ? ?Rx / DC Orders ?ED Discharge Orders   ? ? None  ? ?  ? ? ?  ?Nahum Sherrer A, PA-C ?12/30/21 2349 ? ?  ?Ernie Avena, MD ?12/31/21 1238 ? ?

## 2021-12-31 DIAGNOSIS — E538 Deficiency of other specified B group vitamins: Secondary | ICD-10-CM | POA: Diagnosis not present

## 2021-12-31 DIAGNOSIS — Z8616 Personal history of COVID-19: Secondary | ICD-10-CM | POA: Diagnosis not present

## 2021-12-31 DIAGNOSIS — E7849 Other hyperlipidemia: Secondary | ICD-10-CM | POA: Diagnosis not present

## 2021-12-31 DIAGNOSIS — M6281 Muscle weakness (generalized): Secondary | ICD-10-CM | POA: Diagnosis not present

## 2021-12-31 DIAGNOSIS — R404 Transient alteration of awareness: Secondary | ICD-10-CM | POA: Diagnosis not present

## 2021-12-31 DIAGNOSIS — I10 Essential (primary) hypertension: Secondary | ICD-10-CM | POA: Diagnosis not present

## 2021-12-31 DIAGNOSIS — G20 Parkinson's disease: Secondary | ICD-10-CM | POA: Diagnosis not present

## 2021-12-31 DIAGNOSIS — E43 Unspecified severe protein-calorie malnutrition: Secondary | ICD-10-CM | POA: Diagnosis not present

## 2021-12-31 DIAGNOSIS — Z7401 Bed confinement status: Secondary | ICD-10-CM | POA: Diagnosis not present

## 2021-12-31 NOTE — ED Notes (Signed)
Called Non Emergency for [facility] transportation at 1211am ?

## 2021-12-31 NOTE — ED Provider Notes (Signed)
?  Physical Exam  ?BP (!) 150/98   Pulse (!) 57   Temp 98.4 ?F (36.9 ?C) (Oral)   Resp 18   Ht 5\' 9"  (1.753 m)   Wt 57.6 kg   SpO2 94%   BMI 18.75 kg/m?  ? ? ? ?Procedures  ?Procedures ? ?ED Course / MDM  ?  ?Medical Decision Making ?Amount and/or Complexity of Data Reviewed ?Radiology: ordered. ? ? ?76 year old male presenting after a mechanical fall during which time he struck the back of his head on a door.  Had some tenderness palpation of the lumbar spine. ? ?CT head: ?IMPRESSION:  ?1. No acute intracranial abnormality. No skull fracture.  ?2. Stable chronic changes.  ?   ? ? ?CT cervical spine: ? ?IMPRESSION:  ?1. No acute fracture or traumatic subluxation of the cervical spine.  ?2. Levo scoliotic curvature with multilevel degenerative disc  ?disease and facet hypertrophy.  ? ? ?CT lumbar spine: ? ?IMPRESSION:  ?1. No definite acute fracture is seen.  ?2. Subacute appearing right transverse process fracture at L3.  ?Chronic compression fracture at L1  ?3. Multilevel degenerative changes without high-grade canal stenosis  ?4. Multiple vertebral hemangioma.  ? ? ?Single subacute right TP fracture noted at L3.  No acute fractures or intracranial abnormality within the head, cervical spine or lumbar spine.  Overall stable for discharge.  Referral placed for outpatient follow-up with spine surgery in clinic for repeat assessment. ?  ?67, MD ?12/31/21 1237 ? ?

## 2022-01-01 DIAGNOSIS — G20 Parkinson's disease: Secondary | ICD-10-CM | POA: Diagnosis not present

## 2022-01-01 DIAGNOSIS — M6281 Muscle weakness (generalized): Secondary | ICD-10-CM | POA: Diagnosis not present

## 2022-01-01 DIAGNOSIS — E7849 Other hyperlipidemia: Secondary | ICD-10-CM | POA: Diagnosis not present

## 2022-01-01 DIAGNOSIS — I10 Essential (primary) hypertension: Secondary | ICD-10-CM | POA: Diagnosis not present

## 2022-01-01 DIAGNOSIS — R296 Repeated falls: Secondary | ICD-10-CM | POA: Diagnosis not present

## 2022-01-01 DIAGNOSIS — E538 Deficiency of other specified B group vitamins: Secondary | ICD-10-CM | POA: Diagnosis not present

## 2022-01-01 DIAGNOSIS — S329XXD Fracture of unspecified parts of lumbosacral spine and pelvis, subsequent encounter for fracture with routine healing: Secondary | ICD-10-CM | POA: Diagnosis not present

## 2022-01-01 DIAGNOSIS — Z8616 Personal history of COVID-19: Secondary | ICD-10-CM | POA: Diagnosis not present

## 2022-01-01 DIAGNOSIS — E43 Unspecified severe protein-calorie malnutrition: Secondary | ICD-10-CM | POA: Diagnosis not present

## 2022-01-02 DIAGNOSIS — E43 Unspecified severe protein-calorie malnutrition: Secondary | ICD-10-CM | POA: Diagnosis not present

## 2022-01-02 DIAGNOSIS — G20 Parkinson's disease: Secondary | ICD-10-CM | POA: Diagnosis not present

## 2022-01-02 DIAGNOSIS — E7849 Other hyperlipidemia: Secondary | ICD-10-CM | POA: Diagnosis not present

## 2022-01-02 DIAGNOSIS — M6281 Muscle weakness (generalized): Secondary | ICD-10-CM | POA: Diagnosis not present

## 2022-01-02 DIAGNOSIS — Z8616 Personal history of COVID-19: Secondary | ICD-10-CM | POA: Diagnosis not present

## 2022-01-02 DIAGNOSIS — I10 Essential (primary) hypertension: Secondary | ICD-10-CM | POA: Diagnosis not present

## 2022-01-02 DIAGNOSIS — E538 Deficiency of other specified B group vitamins: Secondary | ICD-10-CM | POA: Diagnosis not present

## 2022-01-03 DIAGNOSIS — I10 Essential (primary) hypertension: Secondary | ICD-10-CM | POA: Diagnosis not present

## 2022-01-03 DIAGNOSIS — G20 Parkinson's disease: Secondary | ICD-10-CM | POA: Diagnosis not present

## 2022-01-03 DIAGNOSIS — E7849 Other hyperlipidemia: Secondary | ICD-10-CM | POA: Diagnosis not present

## 2022-01-03 DIAGNOSIS — E538 Deficiency of other specified B group vitamins: Secondary | ICD-10-CM | POA: Diagnosis not present

## 2022-01-03 DIAGNOSIS — E43 Unspecified severe protein-calorie malnutrition: Secondary | ICD-10-CM | POA: Diagnosis not present

## 2022-01-03 DIAGNOSIS — Z8616 Personal history of COVID-19: Secondary | ICD-10-CM | POA: Diagnosis not present

## 2022-01-03 DIAGNOSIS — M6281 Muscle weakness (generalized): Secondary | ICD-10-CM | POA: Diagnosis not present

## 2022-01-06 DIAGNOSIS — E7849 Other hyperlipidemia: Secondary | ICD-10-CM | POA: Diagnosis not present

## 2022-01-06 DIAGNOSIS — E538 Deficiency of other specified B group vitamins: Secondary | ICD-10-CM | POA: Diagnosis not present

## 2022-01-06 DIAGNOSIS — Z8616 Personal history of COVID-19: Secondary | ICD-10-CM | POA: Diagnosis not present

## 2022-01-06 DIAGNOSIS — M6281 Muscle weakness (generalized): Secondary | ICD-10-CM | POA: Diagnosis not present

## 2022-01-06 DIAGNOSIS — E43 Unspecified severe protein-calorie malnutrition: Secondary | ICD-10-CM | POA: Diagnosis not present

## 2022-01-06 DIAGNOSIS — I10 Essential (primary) hypertension: Secondary | ICD-10-CM | POA: Diagnosis not present

## 2022-01-06 DIAGNOSIS — G20 Parkinson's disease: Secondary | ICD-10-CM | POA: Diagnosis not present

## 2022-01-07 DIAGNOSIS — M6281 Muscle weakness (generalized): Secondary | ICD-10-CM | POA: Diagnosis not present

## 2022-01-07 DIAGNOSIS — F02B18 Dementia in other diseases classified elsewhere, moderate, with other behavioral disturbance: Secondary | ICD-10-CM | POA: Diagnosis not present

## 2022-01-07 DIAGNOSIS — Z8616 Personal history of COVID-19: Secondary | ICD-10-CM | POA: Diagnosis not present

## 2022-01-07 DIAGNOSIS — G20 Parkinson's disease: Secondary | ICD-10-CM | POA: Diagnosis not present

## 2022-01-07 DIAGNOSIS — E43 Unspecified severe protein-calorie malnutrition: Secondary | ICD-10-CM | POA: Diagnosis not present

## 2022-01-07 DIAGNOSIS — I10 Essential (primary) hypertension: Secondary | ICD-10-CM | POA: Diagnosis not present

## 2022-01-07 DIAGNOSIS — E7849 Other hyperlipidemia: Secondary | ICD-10-CM | POA: Diagnosis not present

## 2022-01-07 DIAGNOSIS — E538 Deficiency of other specified B group vitamins: Secondary | ICD-10-CM | POA: Diagnosis not present

## 2022-01-08 DIAGNOSIS — G20 Parkinson's disease: Secondary | ICD-10-CM | POA: Diagnosis not present

## 2022-01-08 DIAGNOSIS — E538 Deficiency of other specified B group vitamins: Secondary | ICD-10-CM | POA: Diagnosis not present

## 2022-01-08 DIAGNOSIS — M6281 Muscle weakness (generalized): Secondary | ICD-10-CM | POA: Diagnosis not present

## 2022-01-08 DIAGNOSIS — E43 Unspecified severe protein-calorie malnutrition: Secondary | ICD-10-CM | POA: Diagnosis not present

## 2022-01-08 DIAGNOSIS — I10 Essential (primary) hypertension: Secondary | ICD-10-CM | POA: Diagnosis not present

## 2022-01-08 DIAGNOSIS — Z8616 Personal history of COVID-19: Secondary | ICD-10-CM | POA: Diagnosis not present

## 2022-01-08 DIAGNOSIS — E7849 Other hyperlipidemia: Secondary | ICD-10-CM | POA: Diagnosis not present

## 2022-01-09 DIAGNOSIS — E43 Unspecified severe protein-calorie malnutrition: Secondary | ICD-10-CM | POA: Diagnosis not present

## 2022-01-09 DIAGNOSIS — I10 Essential (primary) hypertension: Secondary | ICD-10-CM | POA: Diagnosis not present

## 2022-01-09 DIAGNOSIS — G20 Parkinson's disease: Secondary | ICD-10-CM | POA: Diagnosis not present

## 2022-01-09 DIAGNOSIS — E538 Deficiency of other specified B group vitamins: Secondary | ICD-10-CM | POA: Diagnosis not present

## 2022-01-09 DIAGNOSIS — Z8616 Personal history of COVID-19: Secondary | ICD-10-CM | POA: Diagnosis not present

## 2022-01-09 DIAGNOSIS — M6281 Muscle weakness (generalized): Secondary | ICD-10-CM | POA: Diagnosis not present

## 2022-01-09 DIAGNOSIS — E7849 Other hyperlipidemia: Secondary | ICD-10-CM | POA: Diagnosis not present

## 2022-01-10 DIAGNOSIS — E43 Unspecified severe protein-calorie malnutrition: Secondary | ICD-10-CM | POA: Diagnosis not present

## 2022-01-10 DIAGNOSIS — I10 Essential (primary) hypertension: Secondary | ICD-10-CM | POA: Diagnosis not present

## 2022-01-10 DIAGNOSIS — G20 Parkinson's disease: Secondary | ICD-10-CM | POA: Diagnosis not present

## 2022-01-10 DIAGNOSIS — E538 Deficiency of other specified B group vitamins: Secondary | ICD-10-CM | POA: Diagnosis not present

## 2022-01-10 DIAGNOSIS — Z8616 Personal history of COVID-19: Secondary | ICD-10-CM | POA: Diagnosis not present

## 2022-01-10 DIAGNOSIS — E7849 Other hyperlipidemia: Secondary | ICD-10-CM | POA: Diagnosis not present

## 2022-01-10 DIAGNOSIS — M6281 Muscle weakness (generalized): Secondary | ICD-10-CM | POA: Diagnosis not present

## 2022-01-12 DIAGNOSIS — G20 Parkinson's disease: Secondary | ICD-10-CM | POA: Diagnosis not present

## 2022-01-12 DIAGNOSIS — I10 Essential (primary) hypertension: Secondary | ICD-10-CM | POA: Diagnosis not present

## 2022-01-12 DIAGNOSIS — S329XXD Fracture of unspecified parts of lumbosacral spine and pelvis, subsequent encounter for fracture with routine healing: Secondary | ICD-10-CM | POA: Diagnosis not present

## 2022-01-12 DIAGNOSIS — E43 Unspecified severe protein-calorie malnutrition: Secondary | ICD-10-CM | POA: Diagnosis not present

## 2022-01-13 DIAGNOSIS — G20 Parkinson's disease: Secondary | ICD-10-CM | POA: Diagnosis not present

## 2022-01-13 DIAGNOSIS — E7849 Other hyperlipidemia: Secondary | ICD-10-CM | POA: Diagnosis not present

## 2022-01-13 DIAGNOSIS — Z8616 Personal history of COVID-19: Secondary | ICD-10-CM | POA: Diagnosis not present

## 2022-01-13 DIAGNOSIS — E43 Unspecified severe protein-calorie malnutrition: Secondary | ICD-10-CM | POA: Diagnosis not present

## 2022-01-13 DIAGNOSIS — E538 Deficiency of other specified B group vitamins: Secondary | ICD-10-CM | POA: Diagnosis not present

## 2022-01-13 DIAGNOSIS — M6281 Muscle weakness (generalized): Secondary | ICD-10-CM | POA: Diagnosis not present

## 2022-01-13 DIAGNOSIS — I10 Essential (primary) hypertension: Secondary | ICD-10-CM | POA: Diagnosis not present

## 2022-01-14 DIAGNOSIS — Z8616 Personal history of COVID-19: Secondary | ICD-10-CM | POA: Diagnosis not present

## 2022-01-14 DIAGNOSIS — M6281 Muscle weakness (generalized): Secondary | ICD-10-CM | POA: Diagnosis not present

## 2022-01-14 DIAGNOSIS — E43 Unspecified severe protein-calorie malnutrition: Secondary | ICD-10-CM | POA: Diagnosis not present

## 2022-01-14 DIAGNOSIS — E538 Deficiency of other specified B group vitamins: Secondary | ICD-10-CM | POA: Diagnosis not present

## 2022-01-14 DIAGNOSIS — I10 Essential (primary) hypertension: Secondary | ICD-10-CM | POA: Diagnosis not present

## 2022-01-14 DIAGNOSIS — G20 Parkinson's disease: Secondary | ICD-10-CM | POA: Diagnosis not present

## 2022-01-14 DIAGNOSIS — E7849 Other hyperlipidemia: Secondary | ICD-10-CM | POA: Diagnosis not present

## 2022-01-15 DIAGNOSIS — E538 Deficiency of other specified B group vitamins: Secondary | ICD-10-CM | POA: Diagnosis not present

## 2022-01-15 DIAGNOSIS — G20 Parkinson's disease: Secondary | ICD-10-CM | POA: Diagnosis not present

## 2022-01-15 DIAGNOSIS — M6281 Muscle weakness (generalized): Secondary | ICD-10-CM | POA: Diagnosis not present

## 2022-01-15 DIAGNOSIS — Z8616 Personal history of COVID-19: Secondary | ICD-10-CM | POA: Diagnosis not present

## 2022-01-15 DIAGNOSIS — I10 Essential (primary) hypertension: Secondary | ICD-10-CM | POA: Diagnosis not present

## 2022-01-15 DIAGNOSIS — E43 Unspecified severe protein-calorie malnutrition: Secondary | ICD-10-CM | POA: Diagnosis not present

## 2022-01-15 DIAGNOSIS — E7849 Other hyperlipidemia: Secondary | ICD-10-CM | POA: Diagnosis not present

## 2022-01-16 DIAGNOSIS — E7849 Other hyperlipidemia: Secondary | ICD-10-CM | POA: Diagnosis not present

## 2022-01-16 DIAGNOSIS — G20 Parkinson's disease: Secondary | ICD-10-CM | POA: Diagnosis not present

## 2022-01-16 DIAGNOSIS — I10 Essential (primary) hypertension: Secondary | ICD-10-CM | POA: Diagnosis not present

## 2022-01-16 DIAGNOSIS — M6281 Muscle weakness (generalized): Secondary | ICD-10-CM | POA: Diagnosis not present

## 2022-01-16 DIAGNOSIS — E43 Unspecified severe protein-calorie malnutrition: Secondary | ICD-10-CM | POA: Diagnosis not present

## 2022-01-16 DIAGNOSIS — E538 Deficiency of other specified B group vitamins: Secondary | ICD-10-CM | POA: Diagnosis not present

## 2022-01-16 DIAGNOSIS — Z8616 Personal history of COVID-19: Secondary | ICD-10-CM | POA: Diagnosis not present

## 2022-01-17 DIAGNOSIS — E7849 Other hyperlipidemia: Secondary | ICD-10-CM | POA: Diagnosis not present

## 2022-01-17 DIAGNOSIS — Z8616 Personal history of COVID-19: Secondary | ICD-10-CM | POA: Diagnosis not present

## 2022-01-17 DIAGNOSIS — E43 Unspecified severe protein-calorie malnutrition: Secondary | ICD-10-CM | POA: Diagnosis not present

## 2022-01-17 DIAGNOSIS — R269 Unspecified abnormalities of gait and mobility: Secondary | ICD-10-CM | POA: Diagnosis not present

## 2022-01-17 DIAGNOSIS — K219 Gastro-esophageal reflux disease without esophagitis: Secondary | ICD-10-CM | POA: Diagnosis not present

## 2022-01-17 DIAGNOSIS — M6281 Muscle weakness (generalized): Secondary | ICD-10-CM | POA: Diagnosis not present

## 2022-01-17 DIAGNOSIS — M6282 Rhabdomyolysis: Secondary | ICD-10-CM | POA: Diagnosis not present

## 2022-01-17 DIAGNOSIS — I639 Cerebral infarction, unspecified: Secondary | ICD-10-CM | POA: Diagnosis not present

## 2022-01-17 DIAGNOSIS — E538 Deficiency of other specified B group vitamins: Secondary | ICD-10-CM | POA: Diagnosis not present

## 2022-01-17 DIAGNOSIS — G20 Parkinson's disease: Secondary | ICD-10-CM | POA: Diagnosis not present

## 2022-01-17 DIAGNOSIS — I10 Essential (primary) hypertension: Secondary | ICD-10-CM | POA: Diagnosis not present

## 2022-01-19 DIAGNOSIS — E7849 Other hyperlipidemia: Secondary | ICD-10-CM | POA: Diagnosis not present

## 2022-01-19 DIAGNOSIS — E43 Unspecified severe protein-calorie malnutrition: Secondary | ICD-10-CM | POA: Diagnosis not present

## 2022-01-19 DIAGNOSIS — Z8616 Personal history of COVID-19: Secondary | ICD-10-CM | POA: Diagnosis not present

## 2022-01-19 DIAGNOSIS — G20 Parkinson's disease: Secondary | ICD-10-CM | POA: Diagnosis not present

## 2022-01-19 DIAGNOSIS — M6281 Muscle weakness (generalized): Secondary | ICD-10-CM | POA: Diagnosis not present

## 2022-01-19 DIAGNOSIS — I10 Essential (primary) hypertension: Secondary | ICD-10-CM | POA: Diagnosis not present

## 2022-01-19 DIAGNOSIS — E538 Deficiency of other specified B group vitamins: Secondary | ICD-10-CM | POA: Diagnosis not present

## 2022-01-20 DIAGNOSIS — G20 Parkinson's disease: Secondary | ICD-10-CM | POA: Diagnosis not present

## 2022-01-20 DIAGNOSIS — I10 Essential (primary) hypertension: Secondary | ICD-10-CM | POA: Diagnosis not present

## 2022-01-20 DIAGNOSIS — K219 Gastro-esophageal reflux disease without esophagitis: Secondary | ICD-10-CM | POA: Diagnosis not present

## 2022-01-24 DIAGNOSIS — Z8616 Personal history of COVID-19: Secondary | ICD-10-CM | POA: Diagnosis not present

## 2022-01-24 DIAGNOSIS — I10 Essential (primary) hypertension: Secondary | ICD-10-CM | POA: Diagnosis not present

## 2022-01-24 DIAGNOSIS — E43 Unspecified severe protein-calorie malnutrition: Secondary | ICD-10-CM | POA: Diagnosis not present

## 2022-01-24 DIAGNOSIS — E7849 Other hyperlipidemia: Secondary | ICD-10-CM | POA: Diagnosis not present

## 2022-01-24 DIAGNOSIS — E538 Deficiency of other specified B group vitamins: Secondary | ICD-10-CM | POA: Diagnosis not present

## 2022-01-24 DIAGNOSIS — G20 Parkinson's disease: Secondary | ICD-10-CM | POA: Diagnosis not present

## 2022-01-24 DIAGNOSIS — M6281 Muscle weakness (generalized): Secondary | ICD-10-CM | POA: Diagnosis not present

## 2022-01-25 DIAGNOSIS — G20 Parkinson's disease: Secondary | ICD-10-CM | POA: Diagnosis not present

## 2022-01-25 DIAGNOSIS — K219 Gastro-esophageal reflux disease without esophagitis: Secondary | ICD-10-CM | POA: Diagnosis not present

## 2022-01-25 DIAGNOSIS — I10 Essential (primary) hypertension: Secondary | ICD-10-CM | POA: Diagnosis not present

## 2022-01-25 DIAGNOSIS — E43 Unspecified severe protein-calorie malnutrition: Secondary | ICD-10-CM | POA: Diagnosis not present

## 2022-01-27 DIAGNOSIS — G20 Parkinson's disease: Secondary | ICD-10-CM | POA: Diagnosis not present

## 2022-01-27 DIAGNOSIS — Z8616 Personal history of COVID-19: Secondary | ICD-10-CM | POA: Diagnosis not present

## 2022-01-27 DIAGNOSIS — M6281 Muscle weakness (generalized): Secondary | ICD-10-CM | POA: Diagnosis not present

## 2022-01-27 DIAGNOSIS — E538 Deficiency of other specified B group vitamins: Secondary | ICD-10-CM | POA: Diagnosis not present

## 2022-01-27 DIAGNOSIS — I10 Essential (primary) hypertension: Secondary | ICD-10-CM | POA: Diagnosis not present

## 2022-01-27 DIAGNOSIS — E7849 Other hyperlipidemia: Secondary | ICD-10-CM | POA: Diagnosis not present

## 2022-01-27 DIAGNOSIS — E43 Unspecified severe protein-calorie malnutrition: Secondary | ICD-10-CM | POA: Diagnosis not present

## 2022-01-28 DIAGNOSIS — G20 Parkinson's disease: Secondary | ICD-10-CM | POA: Diagnosis not present

## 2022-01-28 DIAGNOSIS — M6281 Muscle weakness (generalized): Secondary | ICD-10-CM | POA: Diagnosis not present

## 2022-01-28 DIAGNOSIS — E538 Deficiency of other specified B group vitamins: Secondary | ICD-10-CM | POA: Diagnosis not present

## 2022-01-28 DIAGNOSIS — E43 Unspecified severe protein-calorie malnutrition: Secondary | ICD-10-CM | POA: Diagnosis not present

## 2022-01-28 DIAGNOSIS — E7849 Other hyperlipidemia: Secondary | ICD-10-CM | POA: Diagnosis not present

## 2022-01-28 DIAGNOSIS — I10 Essential (primary) hypertension: Secondary | ICD-10-CM | POA: Diagnosis not present

## 2022-01-28 DIAGNOSIS — Z8616 Personal history of COVID-19: Secondary | ICD-10-CM | POA: Diagnosis not present

## 2022-01-29 DIAGNOSIS — M6281 Muscle weakness (generalized): Secondary | ICD-10-CM | POA: Diagnosis not present

## 2022-01-29 DIAGNOSIS — Z8616 Personal history of COVID-19: Secondary | ICD-10-CM | POA: Diagnosis not present

## 2022-01-29 DIAGNOSIS — E538 Deficiency of other specified B group vitamins: Secondary | ICD-10-CM | POA: Diagnosis not present

## 2022-01-29 DIAGNOSIS — E43 Unspecified severe protein-calorie malnutrition: Secondary | ICD-10-CM | POA: Diagnosis not present

## 2022-01-29 DIAGNOSIS — G20 Parkinson's disease: Secondary | ICD-10-CM | POA: Diagnosis not present

## 2022-01-29 DIAGNOSIS — E7849 Other hyperlipidemia: Secondary | ICD-10-CM | POA: Diagnosis not present

## 2022-01-29 DIAGNOSIS — I10 Essential (primary) hypertension: Secondary | ICD-10-CM | POA: Diagnosis not present

## 2022-02-02 DIAGNOSIS — I10 Essential (primary) hypertension: Secondary | ICD-10-CM | POA: Diagnosis not present

## 2022-02-02 DIAGNOSIS — G20 Parkinson's disease: Secondary | ICD-10-CM | POA: Diagnosis not present

## 2022-02-07 DIAGNOSIS — M6282 Rhabdomyolysis: Secondary | ICD-10-CM | POA: Diagnosis not present

## 2022-02-07 DIAGNOSIS — R269 Unspecified abnormalities of gait and mobility: Secondary | ICD-10-CM | POA: Diagnosis not present

## 2022-02-07 DIAGNOSIS — G20 Parkinson's disease: Secondary | ICD-10-CM | POA: Diagnosis not present

## 2022-02-07 DIAGNOSIS — I639 Cerebral infarction, unspecified: Secondary | ICD-10-CM | POA: Diagnosis not present

## 2022-02-12 DIAGNOSIS — K219 Gastro-esophageal reflux disease without esophagitis: Secondary | ICD-10-CM | POA: Diagnosis not present

## 2022-02-12 DIAGNOSIS — I10 Essential (primary) hypertension: Secondary | ICD-10-CM | POA: Diagnosis not present

## 2022-02-12 DIAGNOSIS — G20 Parkinson's disease: Secondary | ICD-10-CM | POA: Diagnosis not present

## 2022-02-17 ENCOUNTER — Ambulatory Visit: Payer: Medicare Other | Admitting: Family Medicine

## 2022-02-17 ENCOUNTER — Telehealth: Payer: Self-pay | Admitting: Neurology
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
## 2022-02-17 NOTE — Patient Instructions (Incomplete)

## 2022-02-17 NOTE — Telephone Encounter (Signed)
[facility] nurse cancelled appt due to pt refuse to come to appt.  ?

## 2022-02-19 DIAGNOSIS — I10 Essential (primary) hypertension: Secondary | ICD-10-CM | POA: Diagnosis not present

## 2022-02-19 DIAGNOSIS — M6281 Muscle weakness (generalized): Secondary | ICD-10-CM | POA: Diagnosis not present

## 2022-02-19 DIAGNOSIS — E7849 Other hyperlipidemia: Secondary | ICD-10-CM | POA: Diagnosis not present

## 2022-02-19 DIAGNOSIS — G20 Parkinson's disease: Secondary | ICD-10-CM | POA: Diagnosis not present

## 2022-02-19 DIAGNOSIS — Z8616 Personal history of COVID-19: Secondary | ICD-10-CM | POA: Diagnosis not present

## 2022-02-19 DIAGNOSIS — E538 Deficiency of other specified B group vitamins: Secondary | ICD-10-CM | POA: Diagnosis not present

## 2022-02-19 DIAGNOSIS — E43 Unspecified severe protein-calorie malnutrition: Secondary | ICD-10-CM | POA: Diagnosis not present

## 2022-02-20 DIAGNOSIS — E43 Unspecified severe protein-calorie malnutrition: Secondary | ICD-10-CM | POA: Diagnosis not present

## 2022-02-20 DIAGNOSIS — E538 Deficiency of other specified B group vitamins: Secondary | ICD-10-CM | POA: Diagnosis not present

## 2022-02-20 DIAGNOSIS — E7849 Other hyperlipidemia: Secondary | ICD-10-CM | POA: Diagnosis not present

## 2022-02-20 DIAGNOSIS — G20 Parkinson's disease: Secondary | ICD-10-CM | POA: Diagnosis not present

## 2022-02-20 DIAGNOSIS — Z8616 Personal history of COVID-19: Secondary | ICD-10-CM | POA: Diagnosis not present

## 2022-02-20 DIAGNOSIS — M6281 Muscle weakness (generalized): Secondary | ICD-10-CM | POA: Diagnosis not present

## 2022-02-20 DIAGNOSIS — I10 Essential (primary) hypertension: Secondary | ICD-10-CM | POA: Diagnosis not present

## 2022-02-21 DIAGNOSIS — I10 Essential (primary) hypertension: Secondary | ICD-10-CM | POA: Diagnosis not present

## 2022-02-21 DIAGNOSIS — E538 Deficiency of other specified B group vitamins: Secondary | ICD-10-CM | POA: Diagnosis not present

## 2022-02-21 DIAGNOSIS — E43 Unspecified severe protein-calorie malnutrition: Secondary | ICD-10-CM | POA: Diagnosis not present

## 2022-02-21 DIAGNOSIS — G20 Parkinson's disease: Secondary | ICD-10-CM | POA: Diagnosis not present

## 2022-02-21 DIAGNOSIS — Z8616 Personal history of COVID-19: Secondary | ICD-10-CM | POA: Diagnosis not present

## 2022-02-21 DIAGNOSIS — M6281 Muscle weakness (generalized): Secondary | ICD-10-CM | POA: Diagnosis not present

## 2022-02-21 DIAGNOSIS — E7849 Other hyperlipidemia: Secondary | ICD-10-CM | POA: Diagnosis not present

## 2022-02-22 DIAGNOSIS — I10 Essential (primary) hypertension: Secondary | ICD-10-CM | POA: Diagnosis not present

## 2022-02-22 DIAGNOSIS — M6281 Muscle weakness (generalized): Secondary | ICD-10-CM | POA: Diagnosis not present

## 2022-02-22 DIAGNOSIS — G20 Parkinson's disease: Secondary | ICD-10-CM | POA: Diagnosis not present

## 2022-02-22 DIAGNOSIS — Z8616 Personal history of COVID-19: Secondary | ICD-10-CM | POA: Diagnosis not present

## 2022-02-22 DIAGNOSIS — E538 Deficiency of other specified B group vitamins: Secondary | ICD-10-CM | POA: Diagnosis not present

## 2022-02-22 DIAGNOSIS — E43 Unspecified severe protein-calorie malnutrition: Secondary | ICD-10-CM | POA: Diagnosis not present

## 2022-02-22 DIAGNOSIS — E7849 Other hyperlipidemia: Secondary | ICD-10-CM | POA: Diagnosis not present

## 2022-02-23 DIAGNOSIS — I10 Essential (primary) hypertension: Secondary | ICD-10-CM | POA: Diagnosis not present

## 2022-02-23 DIAGNOSIS — Z8616 Personal history of COVID-19: Secondary | ICD-10-CM | POA: Diagnosis not present

## 2022-02-23 DIAGNOSIS — E538 Deficiency of other specified B group vitamins: Secondary | ICD-10-CM | POA: Diagnosis not present

## 2022-02-23 DIAGNOSIS — G20 Parkinson's disease: Secondary | ICD-10-CM | POA: Diagnosis not present

## 2022-02-23 DIAGNOSIS — E43 Unspecified severe protein-calorie malnutrition: Secondary | ICD-10-CM | POA: Diagnosis not present

## 2022-02-23 DIAGNOSIS — M6281 Muscle weakness (generalized): Secondary | ICD-10-CM | POA: Diagnosis not present

## 2022-02-23 DIAGNOSIS — E7849 Other hyperlipidemia: Secondary | ICD-10-CM | POA: Diagnosis not present

## 2022-02-24 DIAGNOSIS — I10 Essential (primary) hypertension: Secondary | ICD-10-CM | POA: Diagnosis not present

## 2022-02-24 DIAGNOSIS — K219 Gastro-esophageal reflux disease without esophagitis: Secondary | ICD-10-CM | POA: Diagnosis not present

## 2022-02-24 DIAGNOSIS — G20 Parkinson's disease: Secondary | ICD-10-CM | POA: Diagnosis not present

## 2022-02-26 DIAGNOSIS — G20 Parkinson's disease: Secondary | ICD-10-CM | POA: Diagnosis not present

## 2022-02-26 DIAGNOSIS — Z8616 Personal history of COVID-19: Secondary | ICD-10-CM | POA: Diagnosis not present

## 2022-02-26 DIAGNOSIS — M6281 Muscle weakness (generalized): Secondary | ICD-10-CM | POA: Diagnosis not present

## 2022-02-26 DIAGNOSIS — E43 Unspecified severe protein-calorie malnutrition: Secondary | ICD-10-CM | POA: Diagnosis not present

## 2022-02-26 DIAGNOSIS — E7849 Other hyperlipidemia: Secondary | ICD-10-CM | POA: Diagnosis not present

## 2022-02-26 DIAGNOSIS — E538 Deficiency of other specified B group vitamins: Secondary | ICD-10-CM | POA: Diagnosis not present

## 2022-02-26 DIAGNOSIS — I10 Essential (primary) hypertension: Secondary | ICD-10-CM | POA: Diagnosis not present

## 2022-02-27 DIAGNOSIS — Z8616 Personal history of COVID-19: Secondary | ICD-10-CM | POA: Diagnosis not present

## 2022-02-27 DIAGNOSIS — E538 Deficiency of other specified B group vitamins: Secondary | ICD-10-CM | POA: Diagnosis not present

## 2022-02-27 DIAGNOSIS — M6281 Muscle weakness (generalized): Secondary | ICD-10-CM | POA: Diagnosis not present

## 2022-02-27 DIAGNOSIS — I10 Essential (primary) hypertension: Secondary | ICD-10-CM | POA: Diagnosis not present

## 2022-02-27 DIAGNOSIS — E7849 Other hyperlipidemia: Secondary | ICD-10-CM | POA: Diagnosis not present

## 2022-02-27 DIAGNOSIS — E43 Unspecified severe protein-calorie malnutrition: Secondary | ICD-10-CM | POA: Diagnosis not present

## 2022-02-27 DIAGNOSIS — G20 Parkinson's disease: Secondary | ICD-10-CM | POA: Diagnosis not present

## 2022-03-02 DIAGNOSIS — E538 Deficiency of other specified B group vitamins: Secondary | ICD-10-CM | POA: Diagnosis not present

## 2022-03-02 DIAGNOSIS — E7849 Other hyperlipidemia: Secondary | ICD-10-CM | POA: Diagnosis not present

## 2022-03-02 DIAGNOSIS — G20 Parkinson's disease: Secondary | ICD-10-CM | POA: Diagnosis not present

## 2022-03-02 DIAGNOSIS — M6281 Muscle weakness (generalized): Secondary | ICD-10-CM | POA: Diagnosis not present

## 2022-03-02 DIAGNOSIS — Z8616 Personal history of COVID-19: Secondary | ICD-10-CM | POA: Diagnosis not present

## 2022-03-02 DIAGNOSIS — E43 Unspecified severe protein-calorie malnutrition: Secondary | ICD-10-CM | POA: Diagnosis not present

## 2022-03-02 DIAGNOSIS — I10 Essential (primary) hypertension: Secondary | ICD-10-CM | POA: Diagnosis not present

## 2022-03-03 DIAGNOSIS — M6281 Muscle weakness (generalized): Secondary | ICD-10-CM | POA: Diagnosis not present

## 2022-03-03 DIAGNOSIS — I10 Essential (primary) hypertension: Secondary | ICD-10-CM | POA: Diagnosis not present

## 2022-03-03 DIAGNOSIS — G20 Parkinson's disease: Secondary | ICD-10-CM | POA: Diagnosis not present

## 2022-03-03 DIAGNOSIS — E43 Unspecified severe protein-calorie malnutrition: Secondary | ICD-10-CM | POA: Diagnosis not present

## 2022-03-03 DIAGNOSIS — Z8616 Personal history of COVID-19: Secondary | ICD-10-CM | POA: Diagnosis not present

## 2022-03-03 DIAGNOSIS — E7849 Other hyperlipidemia: Secondary | ICD-10-CM | POA: Diagnosis not present

## 2022-03-03 DIAGNOSIS — E538 Deficiency of other specified B group vitamins: Secondary | ICD-10-CM | POA: Diagnosis not present

## 2022-03-04 DIAGNOSIS — Z8616 Personal history of COVID-19: Secondary | ICD-10-CM | POA: Diagnosis not present

## 2022-03-04 DIAGNOSIS — I10 Essential (primary) hypertension: Secondary | ICD-10-CM | POA: Diagnosis not present

## 2022-03-04 DIAGNOSIS — E7849 Other hyperlipidemia: Secondary | ICD-10-CM | POA: Diagnosis not present

## 2022-03-04 DIAGNOSIS — G20 Parkinson's disease: Secondary | ICD-10-CM | POA: Diagnosis not present

## 2022-03-04 DIAGNOSIS — F02B18 Dementia in other diseases classified elsewhere, moderate, with other behavioral disturbance: Secondary | ICD-10-CM | POA: Diagnosis not present

## 2022-03-04 DIAGNOSIS — M6281 Muscle weakness (generalized): Secondary | ICD-10-CM | POA: Diagnosis not present

## 2022-03-04 DIAGNOSIS — E538 Deficiency of other specified B group vitamins: Secondary | ICD-10-CM | POA: Diagnosis not present

## 2022-03-04 DIAGNOSIS — E43 Unspecified severe protein-calorie malnutrition: Secondary | ICD-10-CM | POA: Diagnosis not present

## 2022-03-05 DIAGNOSIS — M6281 Muscle weakness (generalized): Secondary | ICD-10-CM | POA: Diagnosis not present

## 2022-03-05 DIAGNOSIS — E538 Deficiency of other specified B group vitamins: Secondary | ICD-10-CM | POA: Diagnosis not present

## 2022-03-05 DIAGNOSIS — G20 Parkinson's disease: Secondary | ICD-10-CM | POA: Diagnosis not present

## 2022-03-05 DIAGNOSIS — E7849 Other hyperlipidemia: Secondary | ICD-10-CM | POA: Diagnosis not present

## 2022-03-05 DIAGNOSIS — E43 Unspecified severe protein-calorie malnutrition: Secondary | ICD-10-CM | POA: Diagnosis not present

## 2022-03-05 DIAGNOSIS — K219 Gastro-esophageal reflux disease without esophagitis: Secondary | ICD-10-CM | POA: Diagnosis not present

## 2022-03-05 DIAGNOSIS — I10 Essential (primary) hypertension: Secondary | ICD-10-CM | POA: Diagnosis not present

## 2022-03-05 DIAGNOSIS — Z8616 Personal history of COVID-19: Secondary | ICD-10-CM | POA: Diagnosis not present

## 2022-03-06 DIAGNOSIS — I10 Essential (primary) hypertension: Secondary | ICD-10-CM | POA: Diagnosis not present

## 2022-03-06 DIAGNOSIS — E43 Unspecified severe protein-calorie malnutrition: Secondary | ICD-10-CM | POA: Diagnosis not present

## 2022-03-06 DIAGNOSIS — Z8616 Personal history of COVID-19: Secondary | ICD-10-CM | POA: Diagnosis not present

## 2022-03-06 DIAGNOSIS — G20 Parkinson's disease: Secondary | ICD-10-CM | POA: Diagnosis not present

## 2022-03-06 DIAGNOSIS — E7849 Other hyperlipidemia: Secondary | ICD-10-CM | POA: Diagnosis not present

## 2022-03-06 DIAGNOSIS — E538 Deficiency of other specified B group vitamins: Secondary | ICD-10-CM | POA: Diagnosis not present

## 2022-03-06 DIAGNOSIS — M6281 Muscle weakness (generalized): Secondary | ICD-10-CM | POA: Diagnosis not present

## 2022-03-07 DIAGNOSIS — Z8616 Personal history of COVID-19: Secondary | ICD-10-CM | POA: Diagnosis not present

## 2022-03-07 DIAGNOSIS — E43 Unspecified severe protein-calorie malnutrition: Secondary | ICD-10-CM | POA: Diagnosis not present

## 2022-03-07 DIAGNOSIS — M6281 Muscle weakness (generalized): Secondary | ICD-10-CM | POA: Diagnosis not present

## 2022-03-07 DIAGNOSIS — E7849 Other hyperlipidemia: Secondary | ICD-10-CM | POA: Diagnosis not present

## 2022-03-07 DIAGNOSIS — G20 Parkinson's disease: Secondary | ICD-10-CM | POA: Diagnosis not present

## 2022-03-07 DIAGNOSIS — E538 Deficiency of other specified B group vitamins: Secondary | ICD-10-CM | POA: Diagnosis not present

## 2022-03-07 DIAGNOSIS — I10 Essential (primary) hypertension: Secondary | ICD-10-CM | POA: Diagnosis not present

## 2022-03-08 DIAGNOSIS — Z8616 Personal history of COVID-19: Secondary | ICD-10-CM | POA: Diagnosis not present

## 2022-03-08 DIAGNOSIS — E7849 Other hyperlipidemia: Secondary | ICD-10-CM | POA: Diagnosis not present

## 2022-03-08 DIAGNOSIS — I10 Essential (primary) hypertension: Secondary | ICD-10-CM | POA: Diagnosis not present

## 2022-03-08 DIAGNOSIS — E538 Deficiency of other specified B group vitamins: Secondary | ICD-10-CM | POA: Diagnosis not present

## 2022-03-08 DIAGNOSIS — G20 Parkinson's disease: Secondary | ICD-10-CM | POA: Diagnosis not present

## 2022-03-08 DIAGNOSIS — M6281 Muscle weakness (generalized): Secondary | ICD-10-CM | POA: Diagnosis not present

## 2022-03-08 DIAGNOSIS — E43 Unspecified severe protein-calorie malnutrition: Secondary | ICD-10-CM | POA: Diagnosis not present

## 2022-03-10 DIAGNOSIS — M25532 Pain in left wrist: Secondary | ICD-10-CM | POA: Diagnosis not present

## 2022-03-10 DIAGNOSIS — W19XXXA Unspecified fall, initial encounter: Secondary | ICD-10-CM | POA: Diagnosis not present

## 2022-03-10 DIAGNOSIS — M25552 Pain in left hip: Secondary | ICD-10-CM | POA: Diagnosis not present

## 2022-03-10 DIAGNOSIS — M79622 Pain in left upper arm: Secondary | ICD-10-CM | POA: Diagnosis not present

## 2022-03-10 DIAGNOSIS — M79632 Pain in left forearm: Secondary | ICD-10-CM | POA: Diagnosis not present

## 2022-03-10 DIAGNOSIS — M79642 Pain in left hand: Secondary | ICD-10-CM | POA: Diagnosis not present

## 2022-03-11 DIAGNOSIS — I10 Essential (primary) hypertension: Secondary | ICD-10-CM | POA: Diagnosis not present

## 2022-03-11 DIAGNOSIS — E538 Deficiency of other specified B group vitamins: Secondary | ICD-10-CM | POA: Diagnosis not present

## 2022-03-11 DIAGNOSIS — E7849 Other hyperlipidemia: Secondary | ICD-10-CM | POA: Diagnosis not present

## 2022-03-11 DIAGNOSIS — Z8616 Personal history of COVID-19: Secondary | ICD-10-CM | POA: Diagnosis not present

## 2022-03-11 DIAGNOSIS — G20 Parkinson's disease: Secondary | ICD-10-CM | POA: Diagnosis not present

## 2022-03-11 DIAGNOSIS — M6281 Muscle weakness (generalized): Secondary | ICD-10-CM | POA: Diagnosis not present

## 2022-03-11 DIAGNOSIS — E43 Unspecified severe protein-calorie malnutrition: Secondary | ICD-10-CM | POA: Diagnosis not present

## 2022-03-12 DIAGNOSIS — I10 Essential (primary) hypertension: Secondary | ICD-10-CM | POA: Diagnosis not present

## 2022-03-12 DIAGNOSIS — K219 Gastro-esophageal reflux disease without esophagitis: Secondary | ICD-10-CM | POA: Diagnosis not present

## 2022-03-12 DIAGNOSIS — G20 Parkinson's disease: Secondary | ICD-10-CM | POA: Diagnosis not present

## 2022-03-12 DIAGNOSIS — E43 Unspecified severe protein-calorie malnutrition: Secondary | ICD-10-CM | POA: Diagnosis not present

## 2022-03-13 DIAGNOSIS — Z8616 Personal history of COVID-19: Secondary | ICD-10-CM | POA: Diagnosis not present

## 2022-03-13 DIAGNOSIS — M6281 Muscle weakness (generalized): Secondary | ICD-10-CM | POA: Diagnosis not present

## 2022-03-13 DIAGNOSIS — G20 Parkinson's disease: Secondary | ICD-10-CM | POA: Diagnosis not present

## 2022-03-13 DIAGNOSIS — E7849 Other hyperlipidemia: Secondary | ICD-10-CM | POA: Diagnosis not present

## 2022-03-13 DIAGNOSIS — E43 Unspecified severe protein-calorie malnutrition: Secondary | ICD-10-CM | POA: Diagnosis not present

## 2022-03-13 DIAGNOSIS — I10 Essential (primary) hypertension: Secondary | ICD-10-CM | POA: Diagnosis not present

## 2022-03-13 DIAGNOSIS — E538 Deficiency of other specified B group vitamins: Secondary | ICD-10-CM | POA: Diagnosis not present

## 2022-03-14 DIAGNOSIS — I10 Essential (primary) hypertension: Secondary | ICD-10-CM | POA: Diagnosis not present

## 2022-03-14 DIAGNOSIS — E43 Unspecified severe protein-calorie malnutrition: Secondary | ICD-10-CM | POA: Diagnosis not present

## 2022-03-14 DIAGNOSIS — M6282 Rhabdomyolysis: Secondary | ICD-10-CM | POA: Diagnosis not present

## 2022-03-14 DIAGNOSIS — E7849 Other hyperlipidemia: Secondary | ICD-10-CM | POA: Diagnosis not present

## 2022-03-14 DIAGNOSIS — G20 Parkinson's disease: Secondary | ICD-10-CM | POA: Diagnosis not present

## 2022-03-14 DIAGNOSIS — R269 Unspecified abnormalities of gait and mobility: Secondary | ICD-10-CM | POA: Diagnosis not present

## 2022-03-14 DIAGNOSIS — E538 Deficiency of other specified B group vitamins: Secondary | ICD-10-CM | POA: Diagnosis not present

## 2022-03-14 DIAGNOSIS — M6281 Muscle weakness (generalized): Secondary | ICD-10-CM | POA: Diagnosis not present

## 2022-03-14 DIAGNOSIS — I639 Cerebral infarction, unspecified: Secondary | ICD-10-CM | POA: Diagnosis not present

## 2022-03-14 DIAGNOSIS — Z8616 Personal history of COVID-19: Secondary | ICD-10-CM | POA: Diagnosis not present

## 2022-03-16 DIAGNOSIS — Z8616 Personal history of COVID-19: Secondary | ICD-10-CM | POA: Diagnosis not present

## 2022-03-16 DIAGNOSIS — E538 Deficiency of other specified B group vitamins: Secondary | ICD-10-CM | POA: Diagnosis not present

## 2022-03-16 DIAGNOSIS — E43 Unspecified severe protein-calorie malnutrition: Secondary | ICD-10-CM | POA: Diagnosis not present

## 2022-03-16 DIAGNOSIS — I10 Essential (primary) hypertension: Secondary | ICD-10-CM | POA: Diagnosis not present

## 2022-03-16 DIAGNOSIS — E7849 Other hyperlipidemia: Secondary | ICD-10-CM | POA: Diagnosis not present

## 2022-03-16 DIAGNOSIS — M6281 Muscle weakness (generalized): Secondary | ICD-10-CM | POA: Diagnosis not present

## 2022-03-16 DIAGNOSIS — G20 Parkinson's disease: Secondary | ICD-10-CM | POA: Diagnosis not present

## 2022-03-17 DIAGNOSIS — E43 Unspecified severe protein-calorie malnutrition: Secondary | ICD-10-CM | POA: Diagnosis not present

## 2022-03-17 DIAGNOSIS — G20 Parkinson's disease: Secondary | ICD-10-CM | POA: Diagnosis not present

## 2022-03-17 DIAGNOSIS — Z8616 Personal history of COVID-19: Secondary | ICD-10-CM | POA: Diagnosis not present

## 2022-03-17 DIAGNOSIS — I10 Essential (primary) hypertension: Secondary | ICD-10-CM | POA: Diagnosis not present

## 2022-03-17 DIAGNOSIS — E538 Deficiency of other specified B group vitamins: Secondary | ICD-10-CM | POA: Diagnosis not present

## 2022-03-17 DIAGNOSIS — E7849 Other hyperlipidemia: Secondary | ICD-10-CM | POA: Diagnosis not present

## 2022-03-17 DIAGNOSIS — M6281 Muscle weakness (generalized): Secondary | ICD-10-CM | POA: Diagnosis not present

## 2022-03-18 DIAGNOSIS — E7849 Other hyperlipidemia: Secondary | ICD-10-CM | POA: Diagnosis not present

## 2022-03-18 DIAGNOSIS — I10 Essential (primary) hypertension: Secondary | ICD-10-CM | POA: Diagnosis not present

## 2022-03-18 DIAGNOSIS — E43 Unspecified severe protein-calorie malnutrition: Secondary | ICD-10-CM | POA: Diagnosis not present

## 2022-03-18 DIAGNOSIS — G20 Parkinson's disease: Secondary | ICD-10-CM | POA: Diagnosis not present

## 2022-03-18 DIAGNOSIS — Z8616 Personal history of COVID-19: Secondary | ICD-10-CM | POA: Diagnosis not present

## 2022-03-18 DIAGNOSIS — M6281 Muscle weakness (generalized): Secondary | ICD-10-CM | POA: Diagnosis not present

## 2022-03-18 DIAGNOSIS — E538 Deficiency of other specified B group vitamins: Secondary | ICD-10-CM | POA: Diagnosis not present

## 2022-03-19 DIAGNOSIS — G20 Parkinson's disease: Secondary | ICD-10-CM | POA: Diagnosis not present

## 2022-03-19 DIAGNOSIS — E538 Deficiency of other specified B group vitamins: Secondary | ICD-10-CM | POA: Diagnosis not present

## 2022-03-19 DIAGNOSIS — K219 Gastro-esophageal reflux disease without esophagitis: Secondary | ICD-10-CM | POA: Diagnosis not present

## 2022-03-19 DIAGNOSIS — E7849 Other hyperlipidemia: Secondary | ICD-10-CM | POA: Diagnosis not present

## 2022-03-19 DIAGNOSIS — M6281 Muscle weakness (generalized): Secondary | ICD-10-CM | POA: Diagnosis not present

## 2022-03-19 DIAGNOSIS — E43 Unspecified severe protein-calorie malnutrition: Secondary | ICD-10-CM | POA: Diagnosis not present

## 2022-03-19 DIAGNOSIS — Z8616 Personal history of COVID-19: Secondary | ICD-10-CM | POA: Diagnosis not present

## 2022-03-19 DIAGNOSIS — I10 Essential (primary) hypertension: Secondary | ICD-10-CM | POA: Diagnosis not present

## 2022-03-20 DIAGNOSIS — G20 Parkinson's disease: Secondary | ICD-10-CM | POA: Diagnosis not present

## 2022-03-20 DIAGNOSIS — I10 Essential (primary) hypertension: Secondary | ICD-10-CM | POA: Diagnosis not present

## 2022-03-20 DIAGNOSIS — M6281 Muscle weakness (generalized): Secondary | ICD-10-CM | POA: Diagnosis not present

## 2022-03-20 DIAGNOSIS — E7849 Other hyperlipidemia: Secondary | ICD-10-CM | POA: Diagnosis not present

## 2022-03-20 DIAGNOSIS — Z8616 Personal history of COVID-19: Secondary | ICD-10-CM | POA: Diagnosis not present

## 2022-03-20 DIAGNOSIS — E43 Unspecified severe protein-calorie malnutrition: Secondary | ICD-10-CM | POA: Diagnosis not present

## 2022-03-20 DIAGNOSIS — E538 Deficiency of other specified B group vitamins: Secondary | ICD-10-CM | POA: Diagnosis not present

## 2022-03-21 DIAGNOSIS — E538 Deficiency of other specified B group vitamins: Secondary | ICD-10-CM | POA: Diagnosis not present

## 2022-03-21 DIAGNOSIS — Z8616 Personal history of COVID-19: Secondary | ICD-10-CM | POA: Diagnosis not present

## 2022-03-21 DIAGNOSIS — M6281 Muscle weakness (generalized): Secondary | ICD-10-CM | POA: Diagnosis not present

## 2022-03-21 DIAGNOSIS — G20 Parkinson's disease: Secondary | ICD-10-CM | POA: Diagnosis not present

## 2022-03-21 DIAGNOSIS — E43 Unspecified severe protein-calorie malnutrition: Secondary | ICD-10-CM | POA: Diagnosis not present

## 2022-03-21 DIAGNOSIS — I10 Essential (primary) hypertension: Secondary | ICD-10-CM | POA: Diagnosis not present

## 2022-03-21 DIAGNOSIS — E7849 Other hyperlipidemia: Secondary | ICD-10-CM | POA: Diagnosis not present

## 2022-03-22 DIAGNOSIS — R296 Repeated falls: Secondary | ICD-10-CM | POA: Diagnosis not present

## 2022-03-22 DIAGNOSIS — K219 Gastro-esophageal reflux disease without esophagitis: Secondary | ICD-10-CM | POA: Diagnosis not present

## 2022-03-22 DIAGNOSIS — I10 Essential (primary) hypertension: Secondary | ICD-10-CM | POA: Diagnosis not present

## 2022-03-22 DIAGNOSIS — G20 Parkinson's disease: Secondary | ICD-10-CM | POA: Diagnosis not present

## 2022-03-22 DIAGNOSIS — E43 Unspecified severe protein-calorie malnutrition: Secondary | ICD-10-CM | POA: Diagnosis not present

## 2022-03-23 DIAGNOSIS — G20 Parkinson's disease: Secondary | ICD-10-CM | POA: Diagnosis not present

## 2022-03-23 DIAGNOSIS — M6281 Muscle weakness (generalized): Secondary | ICD-10-CM | POA: Diagnosis not present

## 2022-03-23 DIAGNOSIS — E43 Unspecified severe protein-calorie malnutrition: Secondary | ICD-10-CM | POA: Diagnosis not present

## 2022-03-23 DIAGNOSIS — Z8616 Personal history of COVID-19: Secondary | ICD-10-CM | POA: Diagnosis not present

## 2022-03-23 DIAGNOSIS — E7849 Other hyperlipidemia: Secondary | ICD-10-CM | POA: Diagnosis not present

## 2022-03-23 DIAGNOSIS — E538 Deficiency of other specified B group vitamins: Secondary | ICD-10-CM | POA: Diagnosis not present

## 2022-03-23 DIAGNOSIS — I10 Essential (primary) hypertension: Secondary | ICD-10-CM | POA: Diagnosis not present

## 2022-03-25 DIAGNOSIS — E43 Unspecified severe protein-calorie malnutrition: Secondary | ICD-10-CM | POA: Diagnosis not present

## 2022-03-25 DIAGNOSIS — G20 Parkinson's disease: Secondary | ICD-10-CM | POA: Diagnosis not present

## 2022-03-25 DIAGNOSIS — I10 Essential (primary) hypertension: Secondary | ICD-10-CM | POA: Diagnosis not present

## 2022-03-25 DIAGNOSIS — Z8616 Personal history of COVID-19: Secondary | ICD-10-CM | POA: Diagnosis not present

## 2022-03-25 DIAGNOSIS — M6281 Muscle weakness (generalized): Secondary | ICD-10-CM | POA: Diagnosis not present

## 2022-03-25 DIAGNOSIS — E7849 Other hyperlipidemia: Secondary | ICD-10-CM | POA: Diagnosis not present

## 2022-03-25 DIAGNOSIS — E538 Deficiency of other specified B group vitamins: Secondary | ICD-10-CM | POA: Diagnosis not present

## 2022-03-26 DIAGNOSIS — Z8616 Personal history of COVID-19: Secondary | ICD-10-CM | POA: Diagnosis not present

## 2022-03-26 DIAGNOSIS — G20 Parkinson's disease: Secondary | ICD-10-CM | POA: Diagnosis not present

## 2022-03-26 DIAGNOSIS — I10 Essential (primary) hypertension: Secondary | ICD-10-CM | POA: Diagnosis not present

## 2022-03-26 DIAGNOSIS — E43 Unspecified severe protein-calorie malnutrition: Secondary | ICD-10-CM | POA: Diagnosis not present

## 2022-03-26 DIAGNOSIS — E538 Deficiency of other specified B group vitamins: Secondary | ICD-10-CM | POA: Diagnosis not present

## 2022-03-26 DIAGNOSIS — E7849 Other hyperlipidemia: Secondary | ICD-10-CM | POA: Diagnosis not present

## 2022-03-26 DIAGNOSIS — M6281 Muscle weakness (generalized): Secondary | ICD-10-CM | POA: Diagnosis not present

## 2022-03-27 DIAGNOSIS — E538 Deficiency of other specified B group vitamins: Secondary | ICD-10-CM | POA: Diagnosis not present

## 2022-03-27 DIAGNOSIS — I10 Essential (primary) hypertension: Secondary | ICD-10-CM | POA: Diagnosis not present

## 2022-03-27 DIAGNOSIS — M6281 Muscle weakness (generalized): Secondary | ICD-10-CM | POA: Diagnosis not present

## 2022-03-27 DIAGNOSIS — E43 Unspecified severe protein-calorie malnutrition: Secondary | ICD-10-CM | POA: Diagnosis not present

## 2022-03-27 DIAGNOSIS — Z8616 Personal history of COVID-19: Secondary | ICD-10-CM | POA: Diagnosis not present

## 2022-03-27 DIAGNOSIS — G20 Parkinson's disease: Secondary | ICD-10-CM | POA: Diagnosis not present

## 2022-03-27 DIAGNOSIS — E7849 Other hyperlipidemia: Secondary | ICD-10-CM | POA: Diagnosis not present

## 2022-03-28 DIAGNOSIS — I10 Essential (primary) hypertension: Secondary | ICD-10-CM | POA: Diagnosis not present

## 2022-03-28 DIAGNOSIS — G20 Parkinson's disease: Secondary | ICD-10-CM | POA: Diagnosis not present

## 2022-03-28 DIAGNOSIS — K219 Gastro-esophageal reflux disease without esophagitis: Secondary | ICD-10-CM | POA: Diagnosis not present

## 2022-03-30 DIAGNOSIS — M6281 Muscle weakness (generalized): Secondary | ICD-10-CM | POA: Diagnosis not present

## 2022-03-30 DIAGNOSIS — G20 Parkinson's disease: Secondary | ICD-10-CM | POA: Diagnosis not present

## 2022-03-30 DIAGNOSIS — E7849 Other hyperlipidemia: Secondary | ICD-10-CM | POA: Diagnosis not present

## 2022-03-30 DIAGNOSIS — I10 Essential (primary) hypertension: Secondary | ICD-10-CM | POA: Diagnosis not present

## 2022-03-30 DIAGNOSIS — E43 Unspecified severe protein-calorie malnutrition: Secondary | ICD-10-CM | POA: Diagnosis not present

## 2022-03-30 DIAGNOSIS — E538 Deficiency of other specified B group vitamins: Secondary | ICD-10-CM | POA: Diagnosis not present

## 2022-03-30 DIAGNOSIS — Z8616 Personal history of COVID-19: Secondary | ICD-10-CM | POA: Diagnosis not present

## 2022-04-01 DIAGNOSIS — F02B18 Dementia in other diseases classified elsewhere, moderate, with other behavioral disturbance: Secondary | ICD-10-CM | POA: Diagnosis not present

## 2022-04-01 DIAGNOSIS — I10 Essential (primary) hypertension: Secondary | ICD-10-CM | POA: Diagnosis not present

## 2022-04-01 DIAGNOSIS — M6281 Muscle weakness (generalized): Secondary | ICD-10-CM | POA: Diagnosis not present

## 2022-04-01 DIAGNOSIS — E538 Deficiency of other specified B group vitamins: Secondary | ICD-10-CM | POA: Diagnosis not present

## 2022-04-01 DIAGNOSIS — E43 Unspecified severe protein-calorie malnutrition: Secondary | ICD-10-CM | POA: Diagnosis not present

## 2022-04-01 DIAGNOSIS — Z8616 Personal history of COVID-19: Secondary | ICD-10-CM | POA: Diagnosis not present

## 2022-04-01 DIAGNOSIS — E7849 Other hyperlipidemia: Secondary | ICD-10-CM | POA: Diagnosis not present

## 2022-04-01 DIAGNOSIS — G20 Parkinson's disease: Secondary | ICD-10-CM | POA: Diagnosis not present

## 2022-04-02 DIAGNOSIS — E538 Deficiency of other specified B group vitamins: Secondary | ICD-10-CM | POA: Diagnosis not present

## 2022-04-02 DIAGNOSIS — M6281 Muscle weakness (generalized): Secondary | ICD-10-CM | POA: Diagnosis not present

## 2022-04-02 DIAGNOSIS — E7849 Other hyperlipidemia: Secondary | ICD-10-CM | POA: Diagnosis not present

## 2022-04-02 DIAGNOSIS — E43 Unspecified severe protein-calorie malnutrition: Secondary | ICD-10-CM | POA: Diagnosis not present

## 2022-04-02 DIAGNOSIS — Z8616 Personal history of COVID-19: Secondary | ICD-10-CM | POA: Diagnosis not present

## 2022-04-02 DIAGNOSIS — G20 Parkinson's disease: Secondary | ICD-10-CM | POA: Diagnosis not present

## 2022-04-02 DIAGNOSIS — I10 Essential (primary) hypertension: Secondary | ICD-10-CM | POA: Diagnosis not present

## 2022-04-03 DIAGNOSIS — E7849 Other hyperlipidemia: Secondary | ICD-10-CM | POA: Diagnosis not present

## 2022-04-03 DIAGNOSIS — Z8616 Personal history of COVID-19: Secondary | ICD-10-CM | POA: Diagnosis not present

## 2022-04-03 DIAGNOSIS — G20 Parkinson's disease: Secondary | ICD-10-CM | POA: Diagnosis not present

## 2022-04-03 DIAGNOSIS — E538 Deficiency of other specified B group vitamins: Secondary | ICD-10-CM | POA: Diagnosis not present

## 2022-04-03 DIAGNOSIS — M6281 Muscle weakness (generalized): Secondary | ICD-10-CM | POA: Diagnosis not present

## 2022-04-03 DIAGNOSIS — I10 Essential (primary) hypertension: Secondary | ICD-10-CM | POA: Diagnosis not present

## 2022-04-03 DIAGNOSIS — E43 Unspecified severe protein-calorie malnutrition: Secondary | ICD-10-CM | POA: Diagnosis not present

## 2022-04-04 DIAGNOSIS — E43 Unspecified severe protein-calorie malnutrition: Secondary | ICD-10-CM | POA: Diagnosis not present

## 2022-04-04 DIAGNOSIS — G20 Parkinson's disease: Secondary | ICD-10-CM | POA: Diagnosis not present

## 2022-04-04 DIAGNOSIS — I10 Essential (primary) hypertension: Secondary | ICD-10-CM | POA: Diagnosis not present

## 2022-04-04 DIAGNOSIS — M6281 Muscle weakness (generalized): Secondary | ICD-10-CM | POA: Diagnosis not present

## 2022-04-04 DIAGNOSIS — E7849 Other hyperlipidemia: Secondary | ICD-10-CM | POA: Diagnosis not present

## 2022-04-04 DIAGNOSIS — E538 Deficiency of other specified B group vitamins: Secondary | ICD-10-CM | POA: Diagnosis not present

## 2022-04-04 DIAGNOSIS — Z8616 Personal history of COVID-19: Secondary | ICD-10-CM | POA: Diagnosis not present

## 2022-04-05 ENCOUNTER — Other Ambulatory Visit: Payer: Self-pay

## 2022-04-05 ENCOUNTER — Emergency Department (HOSPITAL_COMMUNITY)
Admission: EM | Admit: 2022-04-05 | Discharge: 2022-04-06 | Disposition: A | Discharge status: Skilled Nursing Facility | Payer: Medicare Other | Attending: Emergency Medicine | Admitting: Emergency Medicine

## 2022-04-05 ENCOUNTER — Encounter (HOSPITAL_COMMUNITY): Payer: Self-pay

## 2022-04-05 ENCOUNTER — Emergency Department (HOSPITAL_COMMUNITY): Payer: Medicare Other

## 2022-04-05 DIAGNOSIS — R531 Weakness: Secondary | ICD-10-CM | POA: Diagnosis not present

## 2022-04-05 DIAGNOSIS — I10 Essential (primary) hypertension: Secondary | ICD-10-CM | POA: Insufficient documentation

## 2022-04-05 DIAGNOSIS — F0280 Dementia in other diseases classified elsewhere without behavioral disturbance: Secondary | ICD-10-CM | POA: Diagnosis not present

## 2022-04-05 DIAGNOSIS — Z743 Need for continuous supervision: Secondary | ICD-10-CM | POA: Diagnosis not present

## 2022-04-05 DIAGNOSIS — R4182 Altered mental status, unspecified: Secondary | ICD-10-CM | POA: Insufficient documentation

## 2022-04-05 DIAGNOSIS — M19012 Primary osteoarthritis, left shoulder: Secondary | ICD-10-CM | POA: Diagnosis not present

## 2022-04-05 DIAGNOSIS — G20 Parkinson's disease: Secondary | ICD-10-CM | POA: Diagnosis not present

## 2022-04-05 DIAGNOSIS — R918 Other nonspecific abnormal finding of lung field: Secondary | ICD-10-CM | POA: Diagnosis not present

## 2022-04-05 DIAGNOSIS — Z7401 Bed confinement status: Secondary | ICD-10-CM | POA: Diagnosis not present

## 2022-04-05 DIAGNOSIS — R0602 Shortness of breath: Secondary | ICD-10-CM

## 2022-04-05 DIAGNOSIS — R059 Cough, unspecified: Secondary | ICD-10-CM | POA: Diagnosis not present

## 2022-04-05 DIAGNOSIS — J984 Other disorders of lung: Secondary | ICD-10-CM | POA: Diagnosis not present

## 2022-04-05 DIAGNOSIS — R0902 Hypoxemia: Secondary | ICD-10-CM | POA: Diagnosis not present

## 2022-04-05 DIAGNOSIS — R404 Transient alteration of awareness: Secondary | ICD-10-CM | POA: Diagnosis not present

## 2022-04-05 DIAGNOSIS — R6889 Other general symptoms and signs: Secondary | ICD-10-CM | POA: Diagnosis not present

## 2022-04-05 LAB — BASIC METABOLIC PANEL
Anion gap: 7 (ref 5–15)
BUN: 14 mg/dL (ref 8–23)
CO2: 29 mmol/L (ref 22–32)
Calcium: 9 mg/dL (ref 8.9–10.3)
Chloride: 102 mmol/L (ref 98–111)
Creatinine, Ser: 1.08 mg/dL (ref 0.61–1.24)
GFR, Estimated: >60 mL/min (ref >60)
Glucose, Bld: 116 mg/dL — ABNORMAL HIGH (ref 70–99)
Potassium: 3.7 mmol/L (ref 3.5–5.1)
Sodium: 138 mmol/L (ref 135–145)

## 2022-04-05 LAB — CBC WITH DIFFERENTIAL/PLATELET
Abs Immature Granulocytes: 0.07 10*3/uL (ref 0.00–0.07)
Basophils Absolute: 0 10*3/uL (ref 0.0–0.1)
Basophils Relative: 0 %
Eosinophils Absolute: 0 10*3/uL (ref 0.0–0.5)
Eosinophils Relative: 0 %
HCT: 39.1 % (ref 39.0–52.0)
Hemoglobin: 12.9 g/dL — ABNORMAL LOW (ref 13.0–17.0)
Immature Granulocytes: 1 %
Lymphocytes Relative: 3 %
Lymphs Abs: 0.4 10*3/uL — ABNORMAL LOW (ref 0.7–4.0)
MCH: 32 pg (ref 26.0–34.0)
MCHC: 33 g/dL (ref 30.0–36.0)
MCV: 97 fL (ref 80.0–100.0)
Monocytes Absolute: 0.6 10*3/uL (ref 0.1–1.0)
Monocytes Relative: 4 %
Neutro Abs: 12.1 10*3/uL — ABNORMAL HIGH (ref 1.7–7.7)
Neutrophils Relative %: 92 %
Platelets: 152 10*3/uL (ref 150–400)
RBC: 4.03 MIL/uL — ABNORMAL LOW (ref 4.22–5.81)
RDW: 13.8 % (ref 11.5–15.5)
WBC: 13.1 10*3/uL — ABNORMAL HIGH (ref 4.0–10.5)
nRBC: 0 % (ref 0.0–0.2)

## 2022-04-05 LAB — TROPONIN I (HIGH SENSITIVITY): Troponin I (High Sensitivity): 8 ng/L (ref <18)

## 2022-04-05 NOTE — ED Triage Notes (Signed)
Pt eating pancakes this morning when the facility states he aspirated on them and has had worsening AMS since this morning.  Facility states non verbal at baseline but pt is alert and oriented at this time to person, DOB, and year.  No coughing noted.  Brought in on NRB pt is 95-99% on RA at this time

## 2022-04-05 NOTE — ED Notes (Addendum)
Pt transferred via [facility] and left [facility] at approx. 1500 unable to take pt off the board without changing pain reassessment times

## 2022-04-05 NOTE — ED Notes (Signed)
[facility] called for transport home. 

## 2022-04-05 NOTE — Discharge Instructions (Signed)
Follow-up with your primary care doctor.  Come back to ER as needed. 

## 2022-04-05 NOTE — ED Provider Notes (Signed)
[facility] EMERGENCY DEPARTMENT Provider Note   CSN: [id number] Arrival date & time: 04/05/22  0947     History  Chief Complaint  Patient presents with   Altered Mental Status    Kevin Gould is a 76 y.o. male.  Per review of chart patient has history of Parkinson's, dementia, hypertension.  Per EMS report patient had possible aspiration event while eating pancakes this morning.  Facility had reported concerns for increasing confusion since event.  Patient currently is alert, oriented to person place time.  EMS had placed patient on NRB.  Patient does report eating pancakes this morning.  He does not have any ongoing difficulty in breathing at this time.  He states that he feels fine.  HPI     Home Medications Prior to Admission medications   Medication Sig Start Date End Date Taking? Authorizing Provider  acetaminophen (TYLENOL) 500 MG tablet Take 500-1,000 mg by mouth See admin instructions. Take 1,000 mg by mouth two times a day and 500 mg every six hours as needed for pain    [provider]  carbidopa-levodopa (PARCOPA) 25-100 MG disintegrating tablet Take 2 tablets by mouth See admin instructions. Take 3 tablets by mouth twice daily for Parkinson at 9am and 1pm, then take 2 tablets by mouth at bedtime for parkinson    [provider]  Cholecalciferol (VITAMIN D3) 50 MCG (2000 UT) TABS Take 2,000 Units by mouth daily.    [provider]  escitalopram (LEXAPRO) 5 MG tablet Take 5 mg by mouth daily.    [provider]  famotidine (PEPCID) 20 MG tablet Take 20 mg by mouth 2 (two) times daily.    [provider]  feeding supplement, ENSURE ENLIVE, (ENSURE ENLIVE) LIQD Take 237 mLs by mouth 2 (two) times daily between meals. Patient not taking: Reported on 05/22/2021 08/22/19   Pennie Banter, DO  [employer] [facility]) OINT Apply 1 application topically See admin instructions. Apply to buttocks for  redness    [provider]  lidocaine (LIDODERM) 5 % Place 1 patch onto the skin See admin instructions. Apply 1 patch to lower back once a day and remove & discard patch within 12 hours or as directed by MD    [provider]  Multiple Vitamin (MULTIVITAMIN WITH MINERALS) TABS tablet Take 1 tablet by mouth daily. 08/23/19   Pennie Banter, DO  senna (SENOKOT) 8.6 MG tablet Take 1 tablet by mouth daily.    [provider]      Allergies    Patient has no known allergies.    Review of Systems   Review of Systems  Constitutional:  Negative for chills and fever.  HENT:  Negative for ear pain and sore throat.   Eyes:  Negative for pain and visual disturbance.  Respiratory:  Negative for cough and shortness of breath.   Cardiovascular:  Negative for chest pain and palpitations.  Gastrointestinal:  Negative for abdominal pain and vomiting.  Genitourinary:  Negative for dysuria and hematuria.  Musculoskeletal:  Negative for arthralgias and back pain.  Skin:  Negative for color change and rash.  Neurological:  Negative for seizures and syncope.  All other systems reviewed and are negative.   Physical Exam Updated Vital Signs BP 127/79 (BP Location: Right Arm)   Pulse 79   Temp 98.4 F (36.9 C) (Oral)   Resp 16   Ht 5\' 9"  (1.753 m)   Wt 57.6 kg   BMI  18.75 kg/m  Physical Exam Vitals and nursing note reviewed.  Constitutional:      General: He is not in acute distress.    Appearance: He is well-developed.  HENT:     Head: Normocephalic and atraumatic.  Eyes:     Conjunctiva/sclera: Conjunctivae normal.  Cardiovascular:     Rate and Rhythm: Normal rate and regular rhythm.     Heart sounds: No murmur heard. Pulmonary:     Effort: Pulmonary effort is normal. No respiratory distress.     Breath sounds: Normal breath sounds.  Abdominal:     Palpations: Abdomen is soft.     Tenderness: There is no abdominal tenderness.  Musculoskeletal:         General: No swelling.     Cervical back: Neck supple.  Skin:    General: Skin is warm and dry.     Capillary Refill: Capillary refill takes less than 2 seconds.  Neurological:     Mental Status: He is alert.     Comments: Alert, oriented; moves all four extremities equally without dififculty  Psychiatric:        Mood and Affect: Mood normal.     ED Results / Procedures / Treatments   Labs (all labs ordered are listed, but only abnormal results are displayed) Labs Reviewed  CBC WITH DIFFERENTIAL/PLATELET  BASIC METABOLIC PANEL  TROPONIN I (HIGH SENSITIVITY)    EKG None  Radiology No results found.  Procedures Procedures    Medications Ordered in ED Medications - No data to display  ED Course/ Medical Decision Making/ A&P                           Medical Decision Making Amount and/or Complexity of Data Reviewed Labs: ordered. Radiology: ordered.   76 year old man with history of Parkinson's, dementia, hypertension presenting to ER due to possible aspiration event this morning.  Patient was placed on room air on arrival here.  He is not in any sort of respiratory distress and he denies any acute respiratory complaint at this time.  There was report of concern for confusion however patient denies feeling confused right now and is able to answer basic questions without difficulty.  Check basic labs, CXR and observe patient in ER for couple hours.  Noted slight leukocytosis, there is however no infiltrate on his CXR to suggest pneumonia or aspiration, patient has no fever.  No electrolyte derangement, no kidney dysfunction, troponin within normal limits.  EKG grossly unchanged from prior.  On reassessment patient continues to deny any acute complaints and continues to have normal vital signs on room air.  Given the work-up and patient's lack of ongoing symptoms, feel he is stable for discharge back to his facility.        Final Clinical Impression(s) / ED  Diagnoses Final diagnoses:  None    Rx / DC Orders ED Discharge Orders     None         Milagros Loll, MD 04/06/22 [phone]

## 2022-04-07 DIAGNOSIS — E7849 Other hyperlipidemia: Secondary | ICD-10-CM | POA: Diagnosis not present

## 2022-04-07 DIAGNOSIS — G20 Parkinson's disease: Secondary | ICD-10-CM | POA: Diagnosis not present

## 2022-04-07 DIAGNOSIS — E538 Deficiency of other specified B group vitamins: Secondary | ICD-10-CM | POA: Diagnosis not present

## 2022-04-07 DIAGNOSIS — I10 Essential (primary) hypertension: Secondary | ICD-10-CM | POA: Diagnosis not present

## 2022-04-07 DIAGNOSIS — M6281 Muscle weakness (generalized): Secondary | ICD-10-CM | POA: Diagnosis not present

## 2022-04-07 DIAGNOSIS — Z8616 Personal history of COVID-19: Secondary | ICD-10-CM | POA: Diagnosis not present

## 2022-04-07 DIAGNOSIS — E43 Unspecified severe protein-calorie malnutrition: Secondary | ICD-10-CM | POA: Diagnosis not present

## 2022-04-08 DIAGNOSIS — G20 Parkinson's disease: Secondary | ICD-10-CM | POA: Diagnosis not present

## 2022-04-08 DIAGNOSIS — E538 Deficiency of other specified B group vitamins: Secondary | ICD-10-CM | POA: Diagnosis not present

## 2022-04-08 DIAGNOSIS — E7849 Other hyperlipidemia: Secondary | ICD-10-CM | POA: Diagnosis not present

## 2022-04-08 DIAGNOSIS — M6281 Muscle weakness (generalized): Secondary | ICD-10-CM | POA: Diagnosis not present

## 2022-04-08 DIAGNOSIS — Z8616 Personal history of COVID-19: Secondary | ICD-10-CM | POA: Diagnosis not present

## 2022-04-08 DIAGNOSIS — I10 Essential (primary) hypertension: Secondary | ICD-10-CM | POA: Diagnosis not present

## 2022-04-08 DIAGNOSIS — E43 Unspecified severe protein-calorie malnutrition: Secondary | ICD-10-CM | POA: Diagnosis not present

## 2022-04-09 DIAGNOSIS — K219 Gastro-esophageal reflux disease without esophagitis: Secondary | ICD-10-CM | POA: Diagnosis not present

## 2022-04-09 DIAGNOSIS — I10 Essential (primary) hypertension: Secondary | ICD-10-CM | POA: Diagnosis not present

## 2022-04-09 DIAGNOSIS — G20 Parkinson's disease: Secondary | ICD-10-CM | POA: Diagnosis not present

## 2022-04-10 DIAGNOSIS — Z8616 Personal history of COVID-19: Secondary | ICD-10-CM | POA: Diagnosis not present

## 2022-04-10 DIAGNOSIS — M6281 Muscle weakness (generalized): Secondary | ICD-10-CM | POA: Diagnosis not present

## 2022-04-10 DIAGNOSIS — E7849 Other hyperlipidemia: Secondary | ICD-10-CM | POA: Diagnosis not present

## 2022-04-10 DIAGNOSIS — E538 Deficiency of other specified B group vitamins: Secondary | ICD-10-CM | POA: Diagnosis not present

## 2022-04-10 DIAGNOSIS — E43 Unspecified severe protein-calorie malnutrition: Secondary | ICD-10-CM | POA: Diagnosis not present

## 2022-04-10 DIAGNOSIS — G20 Parkinson's disease: Secondary | ICD-10-CM | POA: Diagnosis not present

## 2022-04-10 DIAGNOSIS — I10 Essential (primary) hypertension: Secondary | ICD-10-CM | POA: Diagnosis not present

## 2022-04-11 DIAGNOSIS — E538 Deficiency of other specified B group vitamins: Secondary | ICD-10-CM | POA: Diagnosis not present

## 2022-04-11 DIAGNOSIS — Z8616 Personal history of COVID-19: Secondary | ICD-10-CM | POA: Diagnosis not present

## 2022-04-11 DIAGNOSIS — G20 Parkinson's disease: Secondary | ICD-10-CM | POA: Diagnosis not present

## 2022-04-11 DIAGNOSIS — E43 Unspecified severe protein-calorie malnutrition: Secondary | ICD-10-CM | POA: Diagnosis not present

## 2022-04-11 DIAGNOSIS — M6281 Muscle weakness (generalized): Secondary | ICD-10-CM | POA: Diagnosis not present

## 2022-04-11 DIAGNOSIS — I10 Essential (primary) hypertension: Secondary | ICD-10-CM | POA: Diagnosis not present

## 2022-04-11 DIAGNOSIS — E7849 Other hyperlipidemia: Secondary | ICD-10-CM | POA: Diagnosis not present

## 2022-04-12 ENCOUNTER — Other Ambulatory Visit: Payer: Self-pay

## 2022-04-12 ENCOUNTER — Emergency Department (HOSPITAL_COMMUNITY): Payer: Medicare Other

## 2022-04-12 ENCOUNTER — Emergency Department (HOSPITAL_COMMUNITY)
Admission: EM | Admit: 2022-04-12 | Discharge: 2022-04-13 | Disposition: A | Discharge status: Rehabilitation Facility | Payer: Medicare Other | Attending: Emergency Medicine | Admitting: Emergency Medicine

## 2022-04-12 ENCOUNTER — Encounter (HOSPITAL_COMMUNITY): Payer: Self-pay

## 2022-04-12 DIAGNOSIS — F0280 Dementia in other diseases classified elsewhere without behavioral disturbance: Secondary | ICD-10-CM | POA: Diagnosis not present

## 2022-04-12 DIAGNOSIS — S4991XA Unspecified injury of right shoulder and upper arm, initial encounter: Secondary | ICD-10-CM | POA: Diagnosis not present

## 2022-04-12 DIAGNOSIS — M549 Dorsalgia, unspecified: Secondary | ICD-10-CM | POA: Diagnosis not present

## 2022-04-12 DIAGNOSIS — W19XXXA Unspecified fall, initial encounter: Secondary | ICD-10-CM | POA: Insufficient documentation

## 2022-04-12 DIAGNOSIS — G20 Parkinson's disease: Secondary | ICD-10-CM | POA: Insufficient documentation

## 2022-04-12 DIAGNOSIS — M79621 Pain in right upper arm: Secondary | ICD-10-CM | POA: Diagnosis not present

## 2022-04-12 DIAGNOSIS — M419 Scoliosis, unspecified: Secondary | ICD-10-CM | POA: Diagnosis not present

## 2022-04-12 DIAGNOSIS — E161 Other hypoglycemia: Secondary | ICD-10-CM | POA: Diagnosis not present

## 2022-04-12 DIAGNOSIS — R6889 Other general symptoms and signs: Secondary | ICD-10-CM | POA: Diagnosis not present

## 2022-04-12 DIAGNOSIS — I739 Peripheral vascular disease, unspecified: Secondary | ICD-10-CM | POA: Diagnosis not present

## 2022-04-12 DIAGNOSIS — J320 Chronic maxillary sinusitis: Secondary | ICD-10-CM | POA: Diagnosis not present

## 2022-04-12 DIAGNOSIS — M79622 Pain in left upper arm: Secondary | ICD-10-CM | POA: Insufficient documentation

## 2022-04-12 DIAGNOSIS — R911 Solitary pulmonary nodule: Secondary | ICD-10-CM | POA: Diagnosis not present

## 2022-04-12 DIAGNOSIS — M47812 Spondylosis without myelopathy or radiculopathy, cervical region: Secondary | ICD-10-CM | POA: Diagnosis not present

## 2022-04-12 DIAGNOSIS — S199XXA Unspecified injury of neck, initial encounter: Secondary | ICD-10-CM | POA: Diagnosis not present

## 2022-04-12 DIAGNOSIS — S0990XA Unspecified injury of head, initial encounter: Secondary | ICD-10-CM | POA: Diagnosis not present

## 2022-04-12 DIAGNOSIS — S4992XA Unspecified injury of left shoulder and upper arm, initial encounter: Secondary | ICD-10-CM | POA: Diagnosis not present

## 2022-04-12 DIAGNOSIS — Z743 Need for continuous supervision: Secondary | ICD-10-CM | POA: Diagnosis not present

## 2022-04-12 DIAGNOSIS — M19012 Primary osteoarthritis, left shoulder: Secondary | ICD-10-CM | POA: Diagnosis not present

## 2022-04-12 DIAGNOSIS — I10 Essential (primary) hypertension: Secondary | ICD-10-CM | POA: Insufficient documentation

## 2022-04-12 DIAGNOSIS — G319 Degenerative disease of nervous system, unspecified: Secondary | ICD-10-CM | POA: Diagnosis not present

## 2022-04-12 DIAGNOSIS — T699XXA Effect of reduced temperature, unspecified, initial encounter: Secondary | ICD-10-CM | POA: Diagnosis not present

## 2022-04-12 NOTE — ED Provider Notes (Signed)
[place] DEPT Provider Note   CSN: [id number] Arrival date & time: 04/12/22  1837     History  Chief Complaint  Patient presents with   Kevin Gould is a 76 y.o. male.  The history is provided by the patient, medical records and the EMS personnel (ems report to nursing). The history is limited by the condition of the patient. No language interpreter was used.  Fall This is a new problem. The problem occurs rarely. The problem has not changed since onset.Pertinent negatives include no chest pain, no abdominal pain, no headaches and no shortness of breath. Nothing aggravates the symptoms. Nothing relieves the symptoms. He has tried nothing for the symptoms. The treatment provided no relief.       Home Medications Prior to Admission medications   Medication Sig Start Date End Date Taking? Authorizing Provider  acetaminophen (TYLENOL) 500 MG tablet Take 500-1,000 mg by mouth See admin instructions. Take 1,000 mg by mouth two times a day and 500 mg every six hours as needed for pain    [provider]  carbidopa-levodopa (PARCOPA) 25-100 MG disintegrating tablet Take 2 tablets by mouth See admin instructions. Take 3 tablets by mouth twice daily for Parkinson at 9am and 1pm, then take 2 tablets by mouth at bedtime for parkinson    [provider]  Cholecalciferol (VITAMIN D3) 50 MCG (2000 UT) TABS Take 2,000 Units by mouth daily.    [provider]  escitalopram (LEXAPRO) 5 MG tablet Take 5 mg by mouth daily.    [provider]  famotidine (PEPCID) 20 MG tablet Take 20 mg by mouth 2 (two) times daily.    [provider]  feeding supplement, ENSURE ENLIVE, (ENSURE ENLIVE) LIQD Take 237 mLs by mouth 2 (two) times daily between meals. Patient not taking: Reported on 05/22/2021 08/22/19   Ezekiel Slocumb, DO  [facility]) OINT Apply 1 application topically See admin instructions.  Apply to buttocks for redness    [provider]  lidocaine (LIDODERM) 5 % Place 1 patch onto the skin See admin instructions. Apply 1 patch to lower back once a day and remove & discard patch within 12 hours or as directed by MD    [provider]  Multiple Vitamin (MULTIVITAMIN WITH MINERALS) TABS tablet Take 1 tablet by mouth daily. 08/23/19   Ezekiel Slocumb, DO  senna (SENOKOT) 8.6 MG tablet Take 1 tablet by mouth daily.    [provider]      Allergies    Patient has no known allergies.    Review of Systems   Review of Systems  Constitutional:  Negative for chills, diaphoresis, fatigue and fever.  HENT:  Negative for congestion.   Eyes:  Negative for visual disturbance.  Respiratory:  Negative for cough, chest tightness, shortness of breath and wheezing.   Cardiovascular:  Negative for chest pain.  Gastrointestinal:  Negative for abdominal pain, constipation, diarrhea, nausea and vomiting.  Genitourinary:  Negative for decreased urine volume, dysuria, flank pain and frequency.  Musculoskeletal:  Negative for back pain, neck pain and neck stiffness.  Skin:  Negative for rash and wound.  Neurological:  Negative for weakness, light-headedness, numbness and headaches.  Psychiatric/Behavioral:  Negative for agitation and confusion.   All other systems reviewed and are negative.   Physical Exam Updated Vital Signs BP (!) 164/92   Pulse 62   Temp 98.3 F (36.8 C) (Oral)  Resp 18   Ht 5\' 9"  (1.753 m)   Wt 57.6 kg   SpO2 98%   BMI 18.75 kg/m  Physical Exam Vitals and nursing note reviewed.  Constitutional:      General: He is not in acute distress.    Appearance: He is well-developed. He is not ill-appearing, toxic-appearing or diaphoretic.  HENT:     Head: Normocephalic and atraumatic.     Nose: No congestion or rhinorrhea.     Mouth/Throat:     Pharynx: No oropharyngeal exudate or posterior oropharyngeal erythema.  Eyes:     Extraocular  Movements: Extraocular movements intact.     Conjunctiva/sclera: Conjunctivae normal.     Pupils: Pupils are equal, round, and reactive to light.  Cardiovascular:     Rate and Rhythm: Normal rate and regular rhythm.     Heart sounds: No murmur heard. Pulmonary:     Effort: Pulmonary effort is normal. No respiratory distress.     Breath sounds: Normal breath sounds. No wheezing, rhonchi or rales.  Chest:     Chest wall: No tenderness.  Abdominal:     General: Abdomen is flat.     Palpations: Abdomen is soft.     Tenderness: There is no abdominal tenderness. There is no right CVA tenderness, left CVA tenderness, guarding or rebound.  Musculoskeletal:        General: Tenderness present. No swelling.     Right upper arm: Tenderness present.     Left upper arm: Tenderness present.     Cervical back: Neck supple. No tenderness.     Thoracic back: No tenderness.     Lumbar back: No tenderness.     Right lower leg: No edema.     Left lower leg: No edema.     Comments: Tenderness in both upper arms.  No wrist tenderness.  Minimal shoulder tenderness.  Lungs clear.  Skin:    General: Skin is warm and dry.     Capillary Refill: Capillary refill takes less than 2 seconds.     Findings: No erythema or rash.  Neurological:     General: No focal deficit present.     Mental Status: He is alert.     Sensory: No sensory deficit.     Motor: No weakness.  Psychiatric:        Mood and Affect: Mood normal.     ED Results / Procedures / Treatments   Labs (all labs ordered are listed, but only abnormal results are displayed) Labs Reviewed - No data to display  EKG None  Radiology CT Cervical Spine Wo Contrast  Result Date: 04/12/2022 CLINICAL DATA:  Trauma, fall EXAM: CT CERVICAL SPINE WITHOUT CONTRAST TECHNIQUE: Multidetector CT imaging of the cervical spine was performed without intravenous contrast. Multiplanar CT image reconstructions were also generated. RADIATION DOSE REDUCTION: This  exam was performed according to the departmental dose-optimization program which includes automated exposure control, adjustment of the mA and/or kV according to patient size and/or use of iterative reconstruction technique. COMPARISON:  12/30/2021 FINDINGS: Alignment: Alignment of posterior margins of vertebral bodies is unremarkable. There is levoscoliosis. Skull base and vertebrae: No recent fracture is seen. Degenerative changes are noted with disc space narrowing, bony spurs and facet hypertrophy at multiple levels. Soft tissues and spinal canal: There is extrinsic pressure over the ventral margin of thecal sac caused by posterior bony spurs. There is no significant central spinal stenosis. Disc levels: There is encroachment of neural foramina from C3 to C7 levels.  Upper chest: Unremarkable. Other: There is inhomogeneous attenuation in the thyroid. There is 1 cm nodule in the left lobe. These findings have not changed significantly. IMPRESSION: No recent fracture is seen in the cervical spine. Cervical spondylosis with encroachment of neural foramina by bony spurs at multiple levels. Levoscoliosis. Electronically Signed   By: Ernie Avena M.D.   On: 04/12/2022 20:44   CT HEAD WO CONTRAST ( )  Result Date: 04/12/2022 CLINICAL DATA:  Trauma, fall EXAM: CT HEAD WITHOUT CONTRAST TECHNIQUE: Contiguous axial images were obtained from the base of the skull through the vertex without intravenous contrast. RADIATION DOSE REDUCTION: This exam was performed according to the departmental dose-optimization program which includes automated exposure control, adjustment of the mA and/or kV according to patient size and/or use of iterative reconstruction technique. COMPARISON:  12/30/2021 FINDINGS: Brain: No acute intracranial findings are seen. There are no signs of bleeding within the cranium. There is dilation of both lateral ventricles with no significant interval change. Cortical sulci are prominent. There is  subtle decreased density in the periventricular white matter. Vascular: Scattered arterial calcifications are seen. Skull: Unremarkable. Sinuses/Orbits: There is frothy density in the sphenoid sinus. There is mucosal thickening in the ethmoid and maxillary sinuses. There is decrease in number of air cells in the left mastoid. Other: None. IMPRESSION: No acute intracranial findings are seen in noncontrast CT brain. Central and cortical atrophy. Small-vessel disease. Possible acute/chronic sphenoid sinusitis. Chronic ethmoid and maxillary sinusitis. Electronically Signed   By: Ernie Avena M.D.   On: 04/12/2022 20:39   DG Humerus Right  Result Date: 04/12/2022 CLINICAL DATA:  Trauma, fall EXAM: RIGHT HUMERUS - 2+ VIEW COMPARISON:  None Available. FINDINGS: No fracture or dislocation is seen. There are no opaque foreign bodies. IMPRESSION: No fracture or dislocation is seen in the right humerus. Electronically Signed   By: Ernie Avena M.D.   On: 04/12/2022 20:36   DG Humerus Left  Result Date: 04/12/2022 CLINICAL DATA:  Trauma, fall EXAM: LEFT HUMERUS - 2+ VIEW COMPARISON:  None Available. FINDINGS: No fracture or dislocation is seen. Degenerative changes are noted in the left shoulder with bony spurs and joint space narrowing. IMPRESSION: No recent fracture is seen in the left humerus. Degenerative changes are noted in the left shoulder. Electronically Signed   By: Ernie Avena M.D.   On: 04/12/2022 20:35    Procedures Procedures    Medications Ordered in ED Medications - No data to display  ED Course/ Medical Decision Making/ A&P                           Medical Decision Making Amount and/or Complexity of Data Reviewed Radiology: ordered.    SANTE BIEDERMANN is a 76 y.o. male with a past medical history significant for Parkinson's disease with dementia, hypertension, hyperlipidemia, frequent falls, and arthritis who presents with bilateral arm pain after fall.   According to EMS report to nursing, patient had a mechanical fall today and was complaining of pain in both arms and hitting his head.  Patient says he does not member what happened but is feeling normal now.  He does complain of some pain in both of his upper arms.  He denies any headache or neck pain but does feel he hit his head and neck.  He denies any recent fevers, chills, congestion, cough, nausea, vomiting, constipation, diarrhea, or urinary changes.  He denies any other complaints\.  On exam, lungs clear  and chest nontender.  Abdomen nontender.  Patient moving all extremities.  Patient has some superficial tenderness in both of his upper arms but did not have significant tenderness in the elbow bilaterally.  Some mild tenderness in the lateral left shoulder but no significant pain with moving.  Neck nontender.  Head nontender.  Pupils symmetric and reactive.  Patient resting comfortably without other complaints.  No focal neurologic deficits seen initially.  Due to patient hitting his head and neck, will get CT head and neck.  We will get x-ray of his humerus bilaterally.  Given his lack of preceding symptoms and his report that he feels normal otherwise, will hold on more extensive work-up.  If imaging is reassuring, anticipate discharge home and patient agrees.  Imaging overall reassuring.  No evidence of acute traumatic injuries.  Patient denies any complaints now and agrees with discharge home.  He will follow-up with his PCP.  He notes questions or concerns and was discharged in good condition.         Final Clinical Impression(s) / ED Diagnoses Final diagnoses:  Fall, initial encounter    Rx / DC Orders ED Discharge Orders     None       Clinical Impression: 1. Fall, initial encounter     Disposition: Discharge  Condition: Good  I have discussed the results, Dx and Tx plan with the pt(& family if present). He/she/they expressed understanding and agree(s) with  the plan. Discharge instructions discussed at great length. Strict return precautions discussed and pt &/or family have verbalized understanding of the instructions. No further questions at time of discharge.    New Prescriptions   No medications on file    Follow Up: Georgann Housekeeper, MD [address] [place] [address] [phone]     [facility] [address] [id number] [place] [address] [place] [address] [phone]       Perry Molla, Canary Brim, MD 04/12/22 2230

## 2022-04-12 NOTE — ED Triage Notes (Signed)
Pt BIB EMS from [place] s/p fall c/o left arm pain.Pt not on blood thinners.

## 2022-04-12 NOTE — Discharge Instructions (Signed)
Your history and exam today prompted [place] to get a CT of the head and neck which did not show any evidence of acute traumatic injuries and the x-rays of your upper arm did not show evidence of injury either.  Given your resolution of symptoms and a reassuring exam, we feel you are safe for discharge home after looking for injuries from your fall.  Given your report of no preceding symptoms we do feel you are safe for discharge home but please follow-up with your primary doctor.  Please rest and stay hydrated.  If any symptoms change or worsen acutely, please return to the nearest emergency department.

## 2022-04-12 NOTE — ED Notes (Signed)
Report given to [facility]. Transportation arranged for pt.

## 2022-04-13 DIAGNOSIS — E43 Unspecified severe protein-calorie malnutrition: Secondary | ICD-10-CM | POA: Diagnosis not present

## 2022-04-13 DIAGNOSIS — Z8616 Personal history of COVID-19: Secondary | ICD-10-CM | POA: Diagnosis not present

## 2022-04-13 DIAGNOSIS — G20 Parkinson's disease: Secondary | ICD-10-CM | POA: Diagnosis not present

## 2022-04-13 DIAGNOSIS — I10 Essential (primary) hypertension: Secondary | ICD-10-CM | POA: Diagnosis not present

## 2022-04-13 DIAGNOSIS — M6281 Muscle weakness (generalized): Secondary | ICD-10-CM | POA: Diagnosis not present

## 2022-04-13 DIAGNOSIS — Z7401 Bed confinement status: Secondary | ICD-10-CM | POA: Diagnosis not present

## 2022-04-13 DIAGNOSIS — M2550 Pain in unspecified joint: Secondary | ICD-10-CM | POA: Diagnosis not present

## 2022-04-13 DIAGNOSIS — W19XXXA Unspecified fall, initial encounter: Secondary | ICD-10-CM | POA: Diagnosis not present

## 2022-04-13 DIAGNOSIS — E7849 Other hyperlipidemia: Secondary | ICD-10-CM | POA: Diagnosis not present

## 2022-04-13 DIAGNOSIS — E538 Deficiency of other specified B group vitamins: Secondary | ICD-10-CM | POA: Diagnosis not present

## 2022-04-14 DIAGNOSIS — E7849 Other hyperlipidemia: Secondary | ICD-10-CM | POA: Diagnosis not present

## 2022-04-14 DIAGNOSIS — E43 Unspecified severe protein-calorie malnutrition: Secondary | ICD-10-CM | POA: Diagnosis not present

## 2022-04-14 DIAGNOSIS — M6281 Muscle weakness (generalized): Secondary | ICD-10-CM | POA: Diagnosis not present

## 2022-04-14 DIAGNOSIS — Z8616 Personal history of COVID-19: Secondary | ICD-10-CM | POA: Diagnosis not present

## 2022-04-14 DIAGNOSIS — E538 Deficiency of other specified B group vitamins: Secondary | ICD-10-CM | POA: Diagnosis not present

## 2022-04-14 DIAGNOSIS — G20 Parkinson's disease: Secondary | ICD-10-CM | POA: Diagnosis not present

## 2022-04-14 DIAGNOSIS — I10 Essential (primary) hypertension: Secondary | ICD-10-CM | POA: Diagnosis not present

## 2022-04-15 DIAGNOSIS — E538 Deficiency of other specified B group vitamins: Secondary | ICD-10-CM | POA: Diagnosis not present

## 2022-04-15 DIAGNOSIS — E43 Unspecified severe protein-calorie malnutrition: Secondary | ICD-10-CM | POA: Diagnosis not present

## 2022-04-15 DIAGNOSIS — E7849 Other hyperlipidemia: Secondary | ICD-10-CM | POA: Diagnosis not present

## 2022-04-15 DIAGNOSIS — I10 Essential (primary) hypertension: Secondary | ICD-10-CM | POA: Diagnosis not present

## 2022-04-15 DIAGNOSIS — M6281 Muscle weakness (generalized): Secondary | ICD-10-CM | POA: Diagnosis not present

## 2022-04-15 DIAGNOSIS — G20 Parkinson's disease: Secondary | ICD-10-CM | POA: Diagnosis not present

## 2022-04-15 DIAGNOSIS — Z8616 Personal history of COVID-19: Secondary | ICD-10-CM | POA: Diagnosis not present

## 2022-04-17 DIAGNOSIS — I10 Essential (primary) hypertension: Secondary | ICD-10-CM | POA: Diagnosis not present

## 2022-04-17 DIAGNOSIS — G20 Parkinson's disease: Secondary | ICD-10-CM | POA: Diagnosis not present

## 2022-04-17 DIAGNOSIS — E43 Unspecified severe protein-calorie malnutrition: Secondary | ICD-10-CM | POA: Diagnosis not present

## 2022-04-17 DIAGNOSIS — R296 Repeated falls: Secondary | ICD-10-CM | POA: Diagnosis not present

## 2022-04-18 DIAGNOSIS — G20 Parkinson's disease: Secondary | ICD-10-CM | POA: Diagnosis not present

## 2022-04-18 DIAGNOSIS — I639 Cerebral infarction, unspecified: Secondary | ICD-10-CM | POA: Diagnosis not present

## 2022-04-18 DIAGNOSIS — R269 Unspecified abnormalities of gait and mobility: Secondary | ICD-10-CM | POA: Diagnosis not present

## 2022-04-18 DIAGNOSIS — M6282 Rhabdomyolysis: Secondary | ICD-10-CM | POA: Diagnosis not present

## 2022-04-25 DIAGNOSIS — G20 Parkinson's disease: Secondary | ICD-10-CM | POA: Diagnosis not present

## 2022-04-25 DIAGNOSIS — I10 Essential (primary) hypertension: Secondary | ICD-10-CM | POA: Diagnosis not present

## 2022-04-25 DIAGNOSIS — K219 Gastro-esophageal reflux disease without esophagitis: Secondary | ICD-10-CM | POA: Diagnosis not present

## 2022-04-27 DIAGNOSIS — R296 Repeated falls: Secondary | ICD-10-CM | POA: Diagnosis not present

## 2022-04-27 DIAGNOSIS — K5909 Other constipation: Secondary | ICD-10-CM | POA: Diagnosis not present

## 2022-04-27 DIAGNOSIS — G20 Parkinson's disease: Secondary | ICD-10-CM | POA: Diagnosis not present

## 2022-04-27 DIAGNOSIS — K219 Gastro-esophageal reflux disease without esophagitis: Secondary | ICD-10-CM | POA: Diagnosis not present

## 2022-04-27 DIAGNOSIS — I10 Essential (primary) hypertension: Secondary | ICD-10-CM | POA: Diagnosis not present

## 2022-04-27 DIAGNOSIS — E43 Unspecified severe protein-calorie malnutrition: Secondary | ICD-10-CM | POA: Diagnosis not present

## 2022-04-29 DIAGNOSIS — G20 Parkinson's disease: Secondary | ICD-10-CM | POA: Diagnosis not present

## 2022-04-29 DIAGNOSIS — F02B18 Dementia in other diseases classified elsewhere, moderate, with other behavioral disturbance: Secondary | ICD-10-CM | POA: Diagnosis not present

## 2022-05-02 DIAGNOSIS — I10 Essential (primary) hypertension: Secondary | ICD-10-CM | POA: Diagnosis not present

## 2022-05-02 DIAGNOSIS — G20 Parkinson's disease: Secondary | ICD-10-CM | POA: Diagnosis not present

## 2022-05-02 DIAGNOSIS — K219 Gastro-esophageal reflux disease without esophagitis: Secondary | ICD-10-CM | POA: Diagnosis not present

## 2022-05-14 DIAGNOSIS — E43 Unspecified severe protein-calorie malnutrition: Secondary | ICD-10-CM | POA: Diagnosis not present

## 2022-05-14 DIAGNOSIS — I10 Essential (primary) hypertension: Secondary | ICD-10-CM | POA: Diagnosis not present

## 2022-05-14 DIAGNOSIS — K219 Gastro-esophageal reflux disease without esophagitis: Secondary | ICD-10-CM | POA: Diagnosis not present

## 2022-05-16 DIAGNOSIS — M6282 Rhabdomyolysis: Secondary | ICD-10-CM | POA: Diagnosis not present

## 2022-05-16 DIAGNOSIS — G20 Parkinson's disease: Secondary | ICD-10-CM | POA: Diagnosis not present

## 2022-05-16 DIAGNOSIS — I639 Cerebral infarction, unspecified: Secondary | ICD-10-CM | POA: Diagnosis not present

## 2022-05-16 DIAGNOSIS — R269 Unspecified abnormalities of gait and mobility: Secondary | ICD-10-CM | POA: Diagnosis not present

## 2022-05-20 DIAGNOSIS — G20 Parkinson's disease: Secondary | ICD-10-CM | POA: Diagnosis not present

## 2022-05-20 DIAGNOSIS — M6281 Muscle weakness (generalized): Secondary | ICD-10-CM | POA: Diagnosis not present

## 2022-05-20 DIAGNOSIS — E43 Unspecified severe protein-calorie malnutrition: Secondary | ICD-10-CM | POA: Diagnosis not present

## 2022-05-20 DIAGNOSIS — E7849 Other hyperlipidemia: Secondary | ICD-10-CM | POA: Diagnosis not present

## 2022-05-20 DIAGNOSIS — Z8616 Personal history of COVID-19: Secondary | ICD-10-CM | POA: Diagnosis not present

## 2022-05-20 DIAGNOSIS — E538 Deficiency of other specified B group vitamins: Secondary | ICD-10-CM | POA: Diagnosis not present

## 2022-05-20 DIAGNOSIS — I10 Essential (primary) hypertension: Secondary | ICD-10-CM | POA: Diagnosis not present

## 2022-05-21 DIAGNOSIS — E43 Unspecified severe protein-calorie malnutrition: Secondary | ICD-10-CM | POA: Diagnosis not present

## 2022-05-21 DIAGNOSIS — R296 Repeated falls: Secondary | ICD-10-CM | POA: Diagnosis not present

## 2022-05-21 DIAGNOSIS — Z8616 Personal history of COVID-19: Secondary | ICD-10-CM | POA: Diagnosis not present

## 2022-05-21 DIAGNOSIS — E538 Deficiency of other specified B group vitamins: Secondary | ICD-10-CM | POA: Diagnosis not present

## 2022-05-21 DIAGNOSIS — G20 Parkinson's disease: Secondary | ICD-10-CM | POA: Diagnosis not present

## 2022-05-21 DIAGNOSIS — M6281 Muscle weakness (generalized): Secondary | ICD-10-CM | POA: Diagnosis not present

## 2022-05-21 DIAGNOSIS — I10 Essential (primary) hypertension: Secondary | ICD-10-CM | POA: Diagnosis not present

## 2022-05-21 DIAGNOSIS — E7849 Other hyperlipidemia: Secondary | ICD-10-CM | POA: Diagnosis not present

## 2022-05-22 DIAGNOSIS — E43 Unspecified severe protein-calorie malnutrition: Secondary | ICD-10-CM | POA: Diagnosis not present

## 2022-05-22 DIAGNOSIS — Z8616 Personal history of COVID-19: Secondary | ICD-10-CM | POA: Diagnosis not present

## 2022-05-22 DIAGNOSIS — E7849 Other hyperlipidemia: Secondary | ICD-10-CM | POA: Diagnosis not present

## 2022-05-22 DIAGNOSIS — M6281 Muscle weakness (generalized): Secondary | ICD-10-CM | POA: Diagnosis not present

## 2022-05-22 DIAGNOSIS — I10 Essential (primary) hypertension: Secondary | ICD-10-CM | POA: Diagnosis not present

## 2022-05-22 DIAGNOSIS — G20 Parkinson's disease: Secondary | ICD-10-CM | POA: Diagnosis not present

## 2022-05-22 DIAGNOSIS — E538 Deficiency of other specified B group vitamins: Secondary | ICD-10-CM | POA: Diagnosis not present

## 2022-05-23 DIAGNOSIS — E43 Unspecified severe protein-calorie malnutrition: Secondary | ICD-10-CM | POA: Diagnosis not present

## 2022-05-23 DIAGNOSIS — G20 Parkinson's disease: Secondary | ICD-10-CM | POA: Diagnosis not present

## 2022-05-23 DIAGNOSIS — I10 Essential (primary) hypertension: Secondary | ICD-10-CM | POA: Diagnosis not present

## 2022-05-23 DIAGNOSIS — E538 Deficiency of other specified B group vitamins: Secondary | ICD-10-CM | POA: Diagnosis not present

## 2022-05-23 DIAGNOSIS — Z8616 Personal history of COVID-19: Secondary | ICD-10-CM | POA: Diagnosis not present

## 2022-05-23 DIAGNOSIS — M6281 Muscle weakness (generalized): Secondary | ICD-10-CM | POA: Diagnosis not present

## 2022-05-23 DIAGNOSIS — E7849 Other hyperlipidemia: Secondary | ICD-10-CM | POA: Diagnosis not present

## 2022-05-24 DIAGNOSIS — K219 Gastro-esophageal reflux disease without esophagitis: Secondary | ICD-10-CM | POA: Diagnosis not present

## 2022-05-24 DIAGNOSIS — E43 Unspecified severe protein-calorie malnutrition: Secondary | ICD-10-CM | POA: Diagnosis not present

## 2022-05-24 DIAGNOSIS — G20 Parkinson's disease: Secondary | ICD-10-CM | POA: Diagnosis not present

## 2022-05-24 DIAGNOSIS — K5909 Other constipation: Secondary | ICD-10-CM | POA: Diagnosis not present

## 2022-05-24 DIAGNOSIS — I10 Essential (primary) hypertension: Secondary | ICD-10-CM | POA: Diagnosis not present

## 2022-05-24 DIAGNOSIS — R296 Repeated falls: Secondary | ICD-10-CM | POA: Diagnosis not present

## 2022-05-26 DIAGNOSIS — E538 Deficiency of other specified B group vitamins: Secondary | ICD-10-CM | POA: Diagnosis not present

## 2022-05-26 DIAGNOSIS — I10 Essential (primary) hypertension: Secondary | ICD-10-CM | POA: Diagnosis not present

## 2022-05-26 DIAGNOSIS — Z8616 Personal history of COVID-19: Secondary | ICD-10-CM | POA: Diagnosis not present

## 2022-05-26 DIAGNOSIS — E43 Unspecified severe protein-calorie malnutrition: Secondary | ICD-10-CM | POA: Diagnosis not present

## 2022-05-26 DIAGNOSIS — M6281 Muscle weakness (generalized): Secondary | ICD-10-CM | POA: Diagnosis not present

## 2022-05-26 DIAGNOSIS — E7849 Other hyperlipidemia: Secondary | ICD-10-CM | POA: Diagnosis not present

## 2022-05-26 DIAGNOSIS — G20 Parkinson's disease: Secondary | ICD-10-CM | POA: Diagnosis not present

## 2022-05-27 DIAGNOSIS — G20 Parkinson's disease: Secondary | ICD-10-CM | POA: Diagnosis not present

## 2022-05-27 DIAGNOSIS — M6281 Muscle weakness (generalized): Secondary | ICD-10-CM | POA: Diagnosis not present

## 2022-05-27 DIAGNOSIS — F02B18 Dementia in other diseases classified elsewhere, moderate, with other behavioral disturbance: Secondary | ICD-10-CM | POA: Diagnosis not present

## 2022-05-27 DIAGNOSIS — E538 Deficiency of other specified B group vitamins: Secondary | ICD-10-CM | POA: Diagnosis not present

## 2022-05-27 DIAGNOSIS — Z8616 Personal history of COVID-19: Secondary | ICD-10-CM | POA: Diagnosis not present

## 2022-05-27 DIAGNOSIS — I10 Essential (primary) hypertension: Secondary | ICD-10-CM | POA: Diagnosis not present

## 2022-05-27 DIAGNOSIS — K219 Gastro-esophageal reflux disease without esophagitis: Secondary | ICD-10-CM | POA: Diagnosis not present

## 2022-05-27 DIAGNOSIS — E43 Unspecified severe protein-calorie malnutrition: Secondary | ICD-10-CM | POA: Diagnosis not present

## 2022-05-27 DIAGNOSIS — E7849 Other hyperlipidemia: Secondary | ICD-10-CM | POA: Diagnosis not present

## 2022-05-27 DIAGNOSIS — R1312 Dysphagia, oropharyngeal phase: Secondary | ICD-10-CM | POA: Diagnosis not present

## 2022-05-28 DIAGNOSIS — R1312 Dysphagia, oropharyngeal phase: Secondary | ICD-10-CM | POA: Diagnosis not present

## 2022-05-28 DIAGNOSIS — E538 Deficiency of other specified B group vitamins: Secondary | ICD-10-CM | POA: Diagnosis not present

## 2022-05-28 DIAGNOSIS — M6281 Muscle weakness (generalized): Secondary | ICD-10-CM | POA: Diagnosis not present

## 2022-05-28 DIAGNOSIS — E7849 Other hyperlipidemia: Secondary | ICD-10-CM | POA: Diagnosis not present

## 2022-05-28 DIAGNOSIS — I10 Essential (primary) hypertension: Secondary | ICD-10-CM | POA: Diagnosis not present

## 2022-05-28 DIAGNOSIS — G20 Parkinson's disease: Secondary | ICD-10-CM | POA: Diagnosis not present

## 2022-05-28 DIAGNOSIS — E43 Unspecified severe protein-calorie malnutrition: Secondary | ICD-10-CM | POA: Diagnosis not present

## 2022-05-28 DIAGNOSIS — Z8616 Personal history of COVID-19: Secondary | ICD-10-CM | POA: Diagnosis not present

## 2022-05-29 DIAGNOSIS — G20 Parkinson's disease: Secondary | ICD-10-CM | POA: Diagnosis not present

## 2022-05-29 DIAGNOSIS — M6281 Muscle weakness (generalized): Secondary | ICD-10-CM | POA: Diagnosis not present

## 2022-05-29 DIAGNOSIS — R1312 Dysphagia, oropharyngeal phase: Secondary | ICD-10-CM | POA: Diagnosis not present

## 2022-05-29 DIAGNOSIS — I10 Essential (primary) hypertension: Secondary | ICD-10-CM | POA: Diagnosis not present

## 2022-05-29 DIAGNOSIS — Z8616 Personal history of COVID-19: Secondary | ICD-10-CM | POA: Diagnosis not present

## 2022-05-29 DIAGNOSIS — E7849 Other hyperlipidemia: Secondary | ICD-10-CM | POA: Diagnosis not present

## 2022-05-29 DIAGNOSIS — E538 Deficiency of other specified B group vitamins: Secondary | ICD-10-CM | POA: Diagnosis not present

## 2022-05-29 DIAGNOSIS — E43 Unspecified severe protein-calorie malnutrition: Secondary | ICD-10-CM | POA: Diagnosis not present

## 2022-06-01 DIAGNOSIS — G20 Parkinson's disease: Secondary | ICD-10-CM | POA: Diagnosis not present

## 2022-06-01 DIAGNOSIS — E538 Deficiency of other specified B group vitamins: Secondary | ICD-10-CM | POA: Diagnosis not present

## 2022-06-01 DIAGNOSIS — Z8616 Personal history of COVID-19: Secondary | ICD-10-CM | POA: Diagnosis not present

## 2022-06-01 DIAGNOSIS — E7849 Other hyperlipidemia: Secondary | ICD-10-CM | POA: Diagnosis not present

## 2022-06-01 DIAGNOSIS — M6281 Muscle weakness (generalized): Secondary | ICD-10-CM | POA: Diagnosis not present

## 2022-06-01 DIAGNOSIS — E43 Unspecified severe protein-calorie malnutrition: Secondary | ICD-10-CM | POA: Diagnosis not present

## 2022-06-01 DIAGNOSIS — R1312 Dysphagia, oropharyngeal phase: Secondary | ICD-10-CM | POA: Diagnosis not present

## 2022-06-01 DIAGNOSIS — I10 Essential (primary) hypertension: Secondary | ICD-10-CM | POA: Diagnosis not present

## 2022-06-02 DIAGNOSIS — E7849 Other hyperlipidemia: Secondary | ICD-10-CM | POA: Diagnosis not present

## 2022-06-02 DIAGNOSIS — E538 Deficiency of other specified B group vitamins: Secondary | ICD-10-CM | POA: Diagnosis not present

## 2022-06-02 DIAGNOSIS — R1312 Dysphagia, oropharyngeal phase: Secondary | ICD-10-CM | POA: Diagnosis not present

## 2022-06-02 DIAGNOSIS — M6281 Muscle weakness (generalized): Secondary | ICD-10-CM | POA: Diagnosis not present

## 2022-06-02 DIAGNOSIS — K219 Gastro-esophageal reflux disease without esophagitis: Secondary | ICD-10-CM | POA: Diagnosis not present

## 2022-06-02 DIAGNOSIS — Z8616 Personal history of COVID-19: Secondary | ICD-10-CM | POA: Diagnosis not present

## 2022-06-02 DIAGNOSIS — G20 Parkinson's disease: Secondary | ICD-10-CM | POA: Diagnosis not present

## 2022-06-02 DIAGNOSIS — I10 Essential (primary) hypertension: Secondary | ICD-10-CM | POA: Diagnosis not present

## 2022-06-02 DIAGNOSIS — E43 Unspecified severe protein-calorie malnutrition: Secondary | ICD-10-CM | POA: Diagnosis not present

## 2022-06-03 DIAGNOSIS — K219 Gastro-esophageal reflux disease without esophagitis: Secondary | ICD-10-CM | POA: Diagnosis not present

## 2022-06-03 DIAGNOSIS — E538 Deficiency of other specified B group vitamins: Secondary | ICD-10-CM | POA: Diagnosis not present

## 2022-06-03 DIAGNOSIS — I10 Essential (primary) hypertension: Secondary | ICD-10-CM | POA: Diagnosis not present

## 2022-06-03 DIAGNOSIS — E43 Unspecified severe protein-calorie malnutrition: Secondary | ICD-10-CM | POA: Diagnosis not present

## 2022-06-03 DIAGNOSIS — Z8616 Personal history of COVID-19: Secondary | ICD-10-CM | POA: Diagnosis not present

## 2022-06-03 DIAGNOSIS — R296 Repeated falls: Secondary | ICD-10-CM | POA: Diagnosis not present

## 2022-06-03 DIAGNOSIS — R1312 Dysphagia, oropharyngeal phase: Secondary | ICD-10-CM | POA: Diagnosis not present

## 2022-06-03 DIAGNOSIS — E7849 Other hyperlipidemia: Secondary | ICD-10-CM | POA: Diagnosis not present

## 2022-06-03 DIAGNOSIS — M6281 Muscle weakness (generalized): Secondary | ICD-10-CM | POA: Diagnosis not present

## 2022-06-03 DIAGNOSIS — G20 Parkinson's disease: Secondary | ICD-10-CM | POA: Diagnosis not present

## 2022-06-04 DIAGNOSIS — M6281 Muscle weakness (generalized): Secondary | ICD-10-CM | POA: Diagnosis not present

## 2022-06-04 DIAGNOSIS — E7849 Other hyperlipidemia: Secondary | ICD-10-CM | POA: Diagnosis not present

## 2022-06-04 DIAGNOSIS — R1312 Dysphagia, oropharyngeal phase: Secondary | ICD-10-CM | POA: Diagnosis not present

## 2022-06-04 DIAGNOSIS — I10 Essential (primary) hypertension: Secondary | ICD-10-CM | POA: Diagnosis not present

## 2022-06-04 DIAGNOSIS — E538 Deficiency of other specified B group vitamins: Secondary | ICD-10-CM | POA: Diagnosis not present

## 2022-06-04 DIAGNOSIS — E039 Hypothyroidism, unspecified: Secondary | ICD-10-CM | POA: Diagnosis not present

## 2022-06-04 DIAGNOSIS — I5042 Chronic combined systolic (congestive) and diastolic (congestive) heart failure: Secondary | ICD-10-CM | POA: Diagnosis not present

## 2022-06-04 DIAGNOSIS — G20 Parkinson's disease: Secondary | ICD-10-CM | POA: Diagnosis not present

## 2022-06-04 DIAGNOSIS — E43 Unspecified severe protein-calorie malnutrition: Secondary | ICD-10-CM | POA: Diagnosis not present

## 2022-06-04 DIAGNOSIS — E119 Type 2 diabetes mellitus without complications: Secondary | ICD-10-CM | POA: Diagnosis not present

## 2022-06-04 DIAGNOSIS — Z8616 Personal history of COVID-19: Secondary | ICD-10-CM | POA: Diagnosis not present

## 2022-06-05 DIAGNOSIS — Z8616 Personal history of COVID-19: Secondary | ICD-10-CM | POA: Diagnosis not present

## 2022-06-05 DIAGNOSIS — M6281 Muscle weakness (generalized): Secondary | ICD-10-CM | POA: Diagnosis not present

## 2022-06-05 DIAGNOSIS — E538 Deficiency of other specified B group vitamins: Secondary | ICD-10-CM | POA: Diagnosis not present

## 2022-06-05 DIAGNOSIS — R1312 Dysphagia, oropharyngeal phase: Secondary | ICD-10-CM | POA: Diagnosis not present

## 2022-06-05 DIAGNOSIS — G20 Parkinson's disease: Secondary | ICD-10-CM | POA: Diagnosis not present

## 2022-06-05 DIAGNOSIS — I10 Essential (primary) hypertension: Secondary | ICD-10-CM | POA: Diagnosis not present

## 2022-06-05 DIAGNOSIS — E7849 Other hyperlipidemia: Secondary | ICD-10-CM | POA: Diagnosis not present

## 2022-06-05 DIAGNOSIS — E43 Unspecified severe protein-calorie malnutrition: Secondary | ICD-10-CM | POA: Diagnosis not present

## 2022-06-06 DIAGNOSIS — E43 Unspecified severe protein-calorie malnutrition: Secondary | ICD-10-CM | POA: Diagnosis not present

## 2022-06-06 DIAGNOSIS — E7849 Other hyperlipidemia: Secondary | ICD-10-CM | POA: Diagnosis not present

## 2022-06-06 DIAGNOSIS — E538 Deficiency of other specified B group vitamins: Secondary | ICD-10-CM | POA: Diagnosis not present

## 2022-06-06 DIAGNOSIS — G20 Parkinson's disease: Secondary | ICD-10-CM | POA: Diagnosis not present

## 2022-06-06 DIAGNOSIS — M6281 Muscle weakness (generalized): Secondary | ICD-10-CM | POA: Diagnosis not present

## 2022-06-06 DIAGNOSIS — Z8616 Personal history of COVID-19: Secondary | ICD-10-CM | POA: Diagnosis not present

## 2022-06-06 DIAGNOSIS — K219 Gastro-esophageal reflux disease without esophagitis: Secondary | ICD-10-CM | POA: Diagnosis not present

## 2022-06-06 DIAGNOSIS — I10 Essential (primary) hypertension: Secondary | ICD-10-CM | POA: Diagnosis not present

## 2022-06-06 DIAGNOSIS — R1312 Dysphagia, oropharyngeal phase: Secondary | ICD-10-CM | POA: Diagnosis not present

## 2022-06-08 DIAGNOSIS — M6281 Muscle weakness (generalized): Secondary | ICD-10-CM | POA: Diagnosis not present

## 2022-06-08 DIAGNOSIS — R1312 Dysphagia, oropharyngeal phase: Secondary | ICD-10-CM | POA: Diagnosis not present

## 2022-06-08 DIAGNOSIS — E7849 Other hyperlipidemia: Secondary | ICD-10-CM | POA: Diagnosis not present

## 2022-06-08 DIAGNOSIS — E43 Unspecified severe protein-calorie malnutrition: Secondary | ICD-10-CM | POA: Diagnosis not present

## 2022-06-08 DIAGNOSIS — E538 Deficiency of other specified B group vitamins: Secondary | ICD-10-CM | POA: Diagnosis not present

## 2022-06-08 DIAGNOSIS — I10 Essential (primary) hypertension: Secondary | ICD-10-CM | POA: Diagnosis not present

## 2022-06-08 DIAGNOSIS — G20 Parkinson's disease: Secondary | ICD-10-CM | POA: Diagnosis not present

## 2022-06-08 DIAGNOSIS — Z8616 Personal history of COVID-19: Secondary | ICD-10-CM | POA: Diagnosis not present

## 2022-06-10 DIAGNOSIS — E7849 Other hyperlipidemia: Secondary | ICD-10-CM | POA: Diagnosis not present

## 2022-06-10 DIAGNOSIS — G20 Parkinson's disease: Secondary | ICD-10-CM | POA: Diagnosis not present

## 2022-06-10 DIAGNOSIS — E43 Unspecified severe protein-calorie malnutrition: Secondary | ICD-10-CM | POA: Diagnosis not present

## 2022-06-10 DIAGNOSIS — R1312 Dysphagia, oropharyngeal phase: Secondary | ICD-10-CM | POA: Diagnosis not present

## 2022-06-10 DIAGNOSIS — Z8616 Personal history of COVID-19: Secondary | ICD-10-CM | POA: Diagnosis not present

## 2022-06-10 DIAGNOSIS — M6281 Muscle weakness (generalized): Secondary | ICD-10-CM | POA: Diagnosis not present

## 2022-06-10 DIAGNOSIS — I10 Essential (primary) hypertension: Secondary | ICD-10-CM | POA: Diagnosis not present

## 2022-06-10 DIAGNOSIS — E538 Deficiency of other specified B group vitamins: Secondary | ICD-10-CM | POA: Diagnosis not present

## 2022-06-11 DIAGNOSIS — I10 Essential (primary) hypertension: Secondary | ICD-10-CM | POA: Diagnosis not present

## 2022-06-11 DIAGNOSIS — R1312 Dysphagia, oropharyngeal phase: Secondary | ICD-10-CM | POA: Diagnosis not present

## 2022-06-11 DIAGNOSIS — E43 Unspecified severe protein-calorie malnutrition: Secondary | ICD-10-CM | POA: Diagnosis not present

## 2022-06-11 DIAGNOSIS — E538 Deficiency of other specified B group vitamins: Secondary | ICD-10-CM | POA: Diagnosis not present

## 2022-06-11 DIAGNOSIS — M6281 Muscle weakness (generalized): Secondary | ICD-10-CM | POA: Diagnosis not present

## 2022-06-11 DIAGNOSIS — E7849 Other hyperlipidemia: Secondary | ICD-10-CM | POA: Diagnosis not present

## 2022-06-11 DIAGNOSIS — Z8616 Personal history of COVID-19: Secondary | ICD-10-CM | POA: Diagnosis not present

## 2022-06-11 DIAGNOSIS — G20 Parkinson's disease: Secondary | ICD-10-CM | POA: Diagnosis not present

## 2022-06-12 DIAGNOSIS — R1312 Dysphagia, oropharyngeal phase: Secondary | ICD-10-CM | POA: Diagnosis not present

## 2022-06-12 DIAGNOSIS — I10 Essential (primary) hypertension: Secondary | ICD-10-CM | POA: Diagnosis not present

## 2022-06-12 DIAGNOSIS — E43 Unspecified severe protein-calorie malnutrition: Secondary | ICD-10-CM | POA: Diagnosis not present

## 2022-06-12 DIAGNOSIS — G20 Parkinson's disease: Secondary | ICD-10-CM | POA: Diagnosis not present

## 2022-06-12 DIAGNOSIS — M6281 Muscle weakness (generalized): Secondary | ICD-10-CM | POA: Diagnosis not present

## 2022-06-12 DIAGNOSIS — E7849 Other hyperlipidemia: Secondary | ICD-10-CM | POA: Diagnosis not present

## 2022-06-12 DIAGNOSIS — Z8616 Personal history of COVID-19: Secondary | ICD-10-CM | POA: Diagnosis not present

## 2022-06-12 DIAGNOSIS — E538 Deficiency of other specified B group vitamins: Secondary | ICD-10-CM | POA: Diagnosis not present

## 2022-06-13 DIAGNOSIS — E538 Deficiency of other specified B group vitamins: Secondary | ICD-10-CM | POA: Diagnosis not present

## 2022-06-13 DIAGNOSIS — R1312 Dysphagia, oropharyngeal phase: Secondary | ICD-10-CM | POA: Diagnosis not present

## 2022-06-13 DIAGNOSIS — E7849 Other hyperlipidemia: Secondary | ICD-10-CM | POA: Diagnosis not present

## 2022-06-13 DIAGNOSIS — I639 Cerebral infarction, unspecified: Secondary | ICD-10-CM | POA: Diagnosis not present

## 2022-06-13 DIAGNOSIS — I10 Essential (primary) hypertension: Secondary | ICD-10-CM | POA: Diagnosis not present

## 2022-06-13 DIAGNOSIS — Z8616 Personal history of COVID-19: Secondary | ICD-10-CM | POA: Diagnosis not present

## 2022-06-13 DIAGNOSIS — M6282 Rhabdomyolysis: Secondary | ICD-10-CM | POA: Diagnosis not present

## 2022-06-13 DIAGNOSIS — E43 Unspecified severe protein-calorie malnutrition: Secondary | ICD-10-CM | POA: Diagnosis not present

## 2022-06-13 DIAGNOSIS — G20 Parkinson's disease: Secondary | ICD-10-CM | POA: Diagnosis not present

## 2022-06-13 DIAGNOSIS — R269 Unspecified abnormalities of gait and mobility: Secondary | ICD-10-CM | POA: Diagnosis not present

## 2022-06-13 DIAGNOSIS — M6281 Muscle weakness (generalized): Secondary | ICD-10-CM | POA: Diagnosis not present

## 2022-06-14 DIAGNOSIS — G20 Parkinson's disease: Secondary | ICD-10-CM | POA: Diagnosis not present

## 2022-06-14 DIAGNOSIS — K219 Gastro-esophageal reflux disease without esophagitis: Secondary | ICD-10-CM | POA: Diagnosis not present

## 2022-06-14 DIAGNOSIS — E43 Unspecified severe protein-calorie malnutrition: Secondary | ICD-10-CM | POA: Diagnosis not present

## 2022-06-14 DIAGNOSIS — K5909 Other constipation: Secondary | ICD-10-CM | POA: Diagnosis not present

## 2022-06-14 DIAGNOSIS — R296 Repeated falls: Secondary | ICD-10-CM | POA: Diagnosis not present

## 2022-06-16 DIAGNOSIS — E7849 Other hyperlipidemia: Secondary | ICD-10-CM | POA: Diagnosis not present

## 2022-06-16 DIAGNOSIS — I10 Essential (primary) hypertension: Secondary | ICD-10-CM | POA: Diagnosis not present

## 2022-06-16 DIAGNOSIS — R1312 Dysphagia, oropharyngeal phase: Secondary | ICD-10-CM | POA: Diagnosis not present

## 2022-06-16 DIAGNOSIS — Z8616 Personal history of COVID-19: Secondary | ICD-10-CM | POA: Diagnosis not present

## 2022-06-16 DIAGNOSIS — M6281 Muscle weakness (generalized): Secondary | ICD-10-CM | POA: Diagnosis not present

## 2022-06-16 DIAGNOSIS — E538 Deficiency of other specified B group vitamins: Secondary | ICD-10-CM | POA: Diagnosis not present

## 2022-06-16 DIAGNOSIS — E43 Unspecified severe protein-calorie malnutrition: Secondary | ICD-10-CM | POA: Diagnosis not present

## 2022-06-16 DIAGNOSIS — G20 Parkinson's disease: Secondary | ICD-10-CM | POA: Diagnosis not present

## 2022-06-17 DIAGNOSIS — E538 Deficiency of other specified B group vitamins: Secondary | ICD-10-CM | POA: Diagnosis not present

## 2022-06-17 DIAGNOSIS — M6281 Muscle weakness (generalized): Secondary | ICD-10-CM | POA: Diagnosis not present

## 2022-06-17 DIAGNOSIS — G20 Parkinson's disease: Secondary | ICD-10-CM | POA: Diagnosis not present

## 2022-06-17 DIAGNOSIS — R1312 Dysphagia, oropharyngeal phase: Secondary | ICD-10-CM | POA: Diagnosis not present

## 2022-06-17 DIAGNOSIS — Z8616 Personal history of COVID-19: Secondary | ICD-10-CM | POA: Diagnosis not present

## 2022-06-17 DIAGNOSIS — I10 Essential (primary) hypertension: Secondary | ICD-10-CM | POA: Diagnosis not present

## 2022-06-17 DIAGNOSIS — E7849 Other hyperlipidemia: Secondary | ICD-10-CM | POA: Diagnosis not present

## 2022-06-17 DIAGNOSIS — E43 Unspecified severe protein-calorie malnutrition: Secondary | ICD-10-CM | POA: Diagnosis not present

## 2022-06-18 DIAGNOSIS — G20 Parkinson's disease: Secondary | ICD-10-CM | POA: Diagnosis not present

## 2022-06-18 DIAGNOSIS — E43 Unspecified severe protein-calorie malnutrition: Secondary | ICD-10-CM | POA: Diagnosis not present

## 2022-06-18 DIAGNOSIS — E7849 Other hyperlipidemia: Secondary | ICD-10-CM | POA: Diagnosis not present

## 2022-06-18 DIAGNOSIS — Z8616 Personal history of COVID-19: Secondary | ICD-10-CM | POA: Diagnosis not present

## 2022-06-18 DIAGNOSIS — E538 Deficiency of other specified B group vitamins: Secondary | ICD-10-CM | POA: Diagnosis not present

## 2022-06-18 DIAGNOSIS — R1312 Dysphagia, oropharyngeal phase: Secondary | ICD-10-CM | POA: Diagnosis not present

## 2022-06-18 DIAGNOSIS — I10 Essential (primary) hypertension: Secondary | ICD-10-CM | POA: Diagnosis not present

## 2022-06-18 DIAGNOSIS — M6281 Muscle weakness (generalized): Secondary | ICD-10-CM | POA: Diagnosis not present

## 2022-06-19 DIAGNOSIS — R296 Repeated falls: Secondary | ICD-10-CM | POA: Diagnosis not present

## 2022-06-19 DIAGNOSIS — K219 Gastro-esophageal reflux disease without esophagitis: Secondary | ICD-10-CM | POA: Diagnosis not present

## 2022-06-19 DIAGNOSIS — E538 Deficiency of other specified B group vitamins: Secondary | ICD-10-CM | POA: Diagnosis not present

## 2022-06-19 DIAGNOSIS — M6281 Muscle weakness (generalized): Secondary | ICD-10-CM | POA: Diagnosis not present

## 2022-06-19 DIAGNOSIS — G20 Parkinson's disease: Secondary | ICD-10-CM | POA: Diagnosis not present

## 2022-06-19 DIAGNOSIS — E43 Unspecified severe protein-calorie malnutrition: Secondary | ICD-10-CM | POA: Diagnosis not present

## 2022-06-19 DIAGNOSIS — Z8616 Personal history of COVID-19: Secondary | ICD-10-CM | POA: Diagnosis not present

## 2022-06-19 DIAGNOSIS — R1312 Dysphagia, oropharyngeal phase: Secondary | ICD-10-CM | POA: Diagnosis not present

## 2022-06-19 DIAGNOSIS — I10 Essential (primary) hypertension: Secondary | ICD-10-CM | POA: Diagnosis not present

## 2022-06-19 DIAGNOSIS — E7849 Other hyperlipidemia: Secondary | ICD-10-CM | POA: Diagnosis not present

## 2022-06-20 DIAGNOSIS — E7849 Other hyperlipidemia: Secondary | ICD-10-CM | POA: Diagnosis not present

## 2022-06-20 DIAGNOSIS — E43 Unspecified severe protein-calorie malnutrition: Secondary | ICD-10-CM | POA: Diagnosis not present

## 2022-06-20 DIAGNOSIS — I10 Essential (primary) hypertension: Secondary | ICD-10-CM | POA: Diagnosis not present

## 2022-06-20 DIAGNOSIS — E538 Deficiency of other specified B group vitamins: Secondary | ICD-10-CM | POA: Diagnosis not present

## 2022-06-20 DIAGNOSIS — Z8616 Personal history of COVID-19: Secondary | ICD-10-CM | POA: Diagnosis not present

## 2022-06-20 DIAGNOSIS — M6281 Muscle weakness (generalized): Secondary | ICD-10-CM | POA: Diagnosis not present

## 2022-06-20 DIAGNOSIS — G20 Parkinson's disease: Secondary | ICD-10-CM | POA: Diagnosis not present

## 2022-06-20 DIAGNOSIS — R1312 Dysphagia, oropharyngeal phase: Secondary | ICD-10-CM | POA: Diagnosis not present

## 2022-06-23 DIAGNOSIS — E7849 Other hyperlipidemia: Secondary | ICD-10-CM | POA: Diagnosis not present

## 2022-06-23 DIAGNOSIS — G20 Parkinson's disease: Secondary | ICD-10-CM | POA: Diagnosis not present

## 2022-06-23 DIAGNOSIS — I10 Essential (primary) hypertension: Secondary | ICD-10-CM | POA: Diagnosis not present

## 2022-06-23 DIAGNOSIS — M6281 Muscle weakness (generalized): Secondary | ICD-10-CM | POA: Diagnosis not present

## 2022-06-23 DIAGNOSIS — E43 Unspecified severe protein-calorie malnutrition: Secondary | ICD-10-CM | POA: Diagnosis not present

## 2022-06-23 DIAGNOSIS — Z8616 Personal history of COVID-19: Secondary | ICD-10-CM | POA: Diagnosis not present

## 2022-06-23 DIAGNOSIS — R1312 Dysphagia, oropharyngeal phase: Secondary | ICD-10-CM | POA: Diagnosis not present

## 2022-06-23 DIAGNOSIS — E538 Deficiency of other specified B group vitamins: Secondary | ICD-10-CM | POA: Diagnosis not present

## 2022-06-24 DIAGNOSIS — G20 Parkinson's disease: Secondary | ICD-10-CM | POA: Diagnosis not present

## 2022-06-24 DIAGNOSIS — F02B18 Dementia in other diseases classified elsewhere, moderate, with other behavioral disturbance: Secondary | ICD-10-CM | POA: Diagnosis not present

## 2022-06-26 DIAGNOSIS — G20 Parkinson's disease: Secondary | ICD-10-CM | POA: Diagnosis not present

## 2022-06-26 DIAGNOSIS — E7849 Other hyperlipidemia: Secondary | ICD-10-CM | POA: Diagnosis not present

## 2022-06-26 DIAGNOSIS — Z8616 Personal history of COVID-19: Secondary | ICD-10-CM | POA: Diagnosis not present

## 2022-06-26 DIAGNOSIS — E43 Unspecified severe protein-calorie malnutrition: Secondary | ICD-10-CM | POA: Diagnosis not present

## 2022-06-26 DIAGNOSIS — R1312 Dysphagia, oropharyngeal phase: Secondary | ICD-10-CM | POA: Diagnosis not present

## 2022-06-26 DIAGNOSIS — I10 Essential (primary) hypertension: Secondary | ICD-10-CM | POA: Diagnosis not present

## 2022-06-26 DIAGNOSIS — M6281 Muscle weakness (generalized): Secondary | ICD-10-CM | POA: Diagnosis not present

## 2022-06-26 DIAGNOSIS — E538 Deficiency of other specified B group vitamins: Secondary | ICD-10-CM | POA: Diagnosis not present

## 2022-06-27 DIAGNOSIS — M6281 Muscle weakness (generalized): Secondary | ICD-10-CM | POA: Diagnosis not present

## 2022-06-27 DIAGNOSIS — I10 Essential (primary) hypertension: Secondary | ICD-10-CM | POA: Diagnosis not present

## 2022-06-27 DIAGNOSIS — R1312 Dysphagia, oropharyngeal phase: Secondary | ICD-10-CM | POA: Diagnosis not present

## 2022-06-27 DIAGNOSIS — E7849 Other hyperlipidemia: Secondary | ICD-10-CM | POA: Diagnosis not present

## 2022-06-27 DIAGNOSIS — Z8616 Personal history of COVID-19: Secondary | ICD-10-CM | POA: Diagnosis not present

## 2022-06-27 DIAGNOSIS — E43 Unspecified severe protein-calorie malnutrition: Secondary | ICD-10-CM | POA: Diagnosis not present

## 2022-06-27 DIAGNOSIS — E538 Deficiency of other specified B group vitamins: Secondary | ICD-10-CM | POA: Diagnosis not present

## 2022-06-29 DIAGNOSIS — I10 Essential (primary) hypertension: Secondary | ICD-10-CM | POA: Diagnosis not present

## 2022-06-29 DIAGNOSIS — E440 Moderate protein-calorie malnutrition: Secondary | ICD-10-CM | POA: Diagnosis not present

## 2022-06-29 DIAGNOSIS — K219 Gastro-esophageal reflux disease without esophagitis: Secondary | ICD-10-CM | POA: Diagnosis not present

## 2022-06-30 DIAGNOSIS — I10 Essential (primary) hypertension: Secondary | ICD-10-CM | POA: Diagnosis not present

## 2022-06-30 DIAGNOSIS — M6281 Muscle weakness (generalized): Secondary | ICD-10-CM | POA: Diagnosis not present

## 2022-06-30 DIAGNOSIS — Z8616 Personal history of COVID-19: Secondary | ICD-10-CM | POA: Diagnosis not present

## 2022-06-30 DIAGNOSIS — E538 Deficiency of other specified B group vitamins: Secondary | ICD-10-CM | POA: Diagnosis not present

## 2022-06-30 DIAGNOSIS — E7849 Other hyperlipidemia: Secondary | ICD-10-CM | POA: Diagnosis not present

## 2022-06-30 DIAGNOSIS — E43 Unspecified severe protein-calorie malnutrition: Secondary | ICD-10-CM | POA: Diagnosis not present

## 2022-06-30 DIAGNOSIS — R1312 Dysphagia, oropharyngeal phase: Secondary | ICD-10-CM | POA: Diagnosis not present

## 2022-07-01 DIAGNOSIS — E7849 Other hyperlipidemia: Secondary | ICD-10-CM | POA: Diagnosis not present

## 2022-07-01 DIAGNOSIS — Z8616 Personal history of COVID-19: Secondary | ICD-10-CM | POA: Diagnosis not present

## 2022-07-01 DIAGNOSIS — E43 Unspecified severe protein-calorie malnutrition: Secondary | ICD-10-CM | POA: Diagnosis not present

## 2022-07-01 DIAGNOSIS — E538 Deficiency of other specified B group vitamins: Secondary | ICD-10-CM | POA: Diagnosis not present

## 2022-07-01 DIAGNOSIS — I10 Essential (primary) hypertension: Secondary | ICD-10-CM | POA: Diagnosis not present

## 2022-07-01 DIAGNOSIS — M6281 Muscle weakness (generalized): Secondary | ICD-10-CM | POA: Diagnosis not present

## 2022-07-01 DIAGNOSIS — R1312 Dysphagia, oropharyngeal phase: Secondary | ICD-10-CM | POA: Diagnosis not present

## 2022-07-03 DIAGNOSIS — I10 Essential (primary) hypertension: Secondary | ICD-10-CM | POA: Diagnosis not present

## 2022-07-03 DIAGNOSIS — K219 Gastro-esophageal reflux disease without esophagitis: Secondary | ICD-10-CM | POA: Diagnosis not present

## 2022-07-03 DIAGNOSIS — E440 Moderate protein-calorie malnutrition: Secondary | ICD-10-CM | POA: Diagnosis not present

## 2022-07-04 DIAGNOSIS — E43 Unspecified severe protein-calorie malnutrition: Secondary | ICD-10-CM | POA: Diagnosis not present

## 2022-07-04 DIAGNOSIS — M6281 Muscle weakness (generalized): Secondary | ICD-10-CM | POA: Diagnosis not present

## 2022-07-04 DIAGNOSIS — R1312 Dysphagia, oropharyngeal phase: Secondary | ICD-10-CM | POA: Diagnosis not present

## 2022-07-04 DIAGNOSIS — I10 Essential (primary) hypertension: Secondary | ICD-10-CM | POA: Diagnosis not present

## 2022-07-04 DIAGNOSIS — Z8616 Personal history of COVID-19: Secondary | ICD-10-CM | POA: Diagnosis not present

## 2022-07-04 DIAGNOSIS — E538 Deficiency of other specified B group vitamins: Secondary | ICD-10-CM | POA: Diagnosis not present

## 2022-07-04 DIAGNOSIS — E7849 Other hyperlipidemia: Secondary | ICD-10-CM | POA: Diagnosis not present

## 2022-07-05 DIAGNOSIS — K219 Gastro-esophageal reflux disease without esophagitis: Secondary | ICD-10-CM | POA: Diagnosis not present

## 2022-07-05 DIAGNOSIS — K5909 Other constipation: Secondary | ICD-10-CM | POA: Diagnosis not present

## 2022-07-05 DIAGNOSIS — G20 Parkinson's disease: Secondary | ICD-10-CM | POA: Diagnosis not present

## 2022-07-05 DIAGNOSIS — E43 Unspecified severe protein-calorie malnutrition: Secondary | ICD-10-CM | POA: Diagnosis not present

## 2022-07-05 DIAGNOSIS — R296 Repeated falls: Secondary | ICD-10-CM | POA: Diagnosis not present

## 2022-07-07 DIAGNOSIS — E43 Unspecified severe protein-calorie malnutrition: Secondary | ICD-10-CM | POA: Diagnosis not present

## 2022-07-07 DIAGNOSIS — E538 Deficiency of other specified B group vitamins: Secondary | ICD-10-CM | POA: Diagnosis not present

## 2022-07-07 DIAGNOSIS — E7849 Other hyperlipidemia: Secondary | ICD-10-CM | POA: Diagnosis not present

## 2022-07-07 DIAGNOSIS — M6281 Muscle weakness (generalized): Secondary | ICD-10-CM | POA: Diagnosis not present

## 2022-07-07 DIAGNOSIS — I10 Essential (primary) hypertension: Secondary | ICD-10-CM | POA: Diagnosis not present

## 2022-07-07 DIAGNOSIS — Z8616 Personal history of COVID-19: Secondary | ICD-10-CM | POA: Diagnosis not present

## 2022-07-07 DIAGNOSIS — R1312 Dysphagia, oropharyngeal phase: Secondary | ICD-10-CM | POA: Diagnosis not present

## 2022-07-08 DIAGNOSIS — E7849 Other hyperlipidemia: Secondary | ICD-10-CM | POA: Diagnosis not present

## 2022-07-08 DIAGNOSIS — M6281 Muscle weakness (generalized): Secondary | ICD-10-CM | POA: Diagnosis not present

## 2022-07-08 DIAGNOSIS — E538 Deficiency of other specified B group vitamins: Secondary | ICD-10-CM | POA: Diagnosis not present

## 2022-07-08 DIAGNOSIS — I10 Essential (primary) hypertension: Secondary | ICD-10-CM | POA: Diagnosis not present

## 2022-07-08 DIAGNOSIS — Z8616 Personal history of COVID-19: Secondary | ICD-10-CM | POA: Diagnosis not present

## 2022-07-08 DIAGNOSIS — E43 Unspecified severe protein-calorie malnutrition: Secondary | ICD-10-CM | POA: Diagnosis not present

## 2022-07-08 DIAGNOSIS — R1312 Dysphagia, oropharyngeal phase: Secondary | ICD-10-CM | POA: Diagnosis not present

## 2022-07-09 DIAGNOSIS — E43 Unspecified severe protein-calorie malnutrition: Secondary | ICD-10-CM | POA: Diagnosis not present

## 2022-07-09 DIAGNOSIS — R1312 Dysphagia, oropharyngeal phase: Secondary | ICD-10-CM | POA: Diagnosis not present

## 2022-07-09 DIAGNOSIS — E538 Deficiency of other specified B group vitamins: Secondary | ICD-10-CM | POA: Diagnosis not present

## 2022-07-09 DIAGNOSIS — Z8616 Personal history of COVID-19: Secondary | ICD-10-CM | POA: Diagnosis not present

## 2022-07-09 DIAGNOSIS — M6281 Muscle weakness (generalized): Secondary | ICD-10-CM | POA: Diagnosis not present

## 2022-07-09 DIAGNOSIS — E7849 Other hyperlipidemia: Secondary | ICD-10-CM | POA: Diagnosis not present

## 2022-07-09 DIAGNOSIS — I10 Essential (primary) hypertension: Secondary | ICD-10-CM | POA: Diagnosis not present

## 2022-07-15 DIAGNOSIS — R1312 Dysphagia, oropharyngeal phase: Secondary | ICD-10-CM | POA: Diagnosis not present

## 2022-07-15 DIAGNOSIS — M6281 Muscle weakness (generalized): Secondary | ICD-10-CM | POA: Diagnosis not present

## 2022-07-15 DIAGNOSIS — E7849 Other hyperlipidemia: Secondary | ICD-10-CM | POA: Diagnosis not present

## 2022-07-15 DIAGNOSIS — E43 Unspecified severe protein-calorie malnutrition: Secondary | ICD-10-CM | POA: Diagnosis not present

## 2022-07-15 DIAGNOSIS — Z8616 Personal history of COVID-19: Secondary | ICD-10-CM | POA: Diagnosis not present

## 2022-07-15 DIAGNOSIS — I10 Essential (primary) hypertension: Secondary | ICD-10-CM | POA: Diagnosis not present

## 2022-07-15 DIAGNOSIS — E538 Deficiency of other specified B group vitamins: Secondary | ICD-10-CM | POA: Diagnosis not present

## 2022-07-16 DIAGNOSIS — E43 Unspecified severe protein-calorie malnutrition: Secondary | ICD-10-CM | POA: Diagnosis not present

## 2022-07-16 DIAGNOSIS — I10 Essential (primary) hypertension: Secondary | ICD-10-CM | POA: Diagnosis not present

## 2022-07-16 DIAGNOSIS — Z8616 Personal history of COVID-19: Secondary | ICD-10-CM | POA: Diagnosis not present

## 2022-07-16 DIAGNOSIS — R1312 Dysphagia, oropharyngeal phase: Secondary | ICD-10-CM | POA: Diagnosis not present

## 2022-07-16 DIAGNOSIS — E538 Deficiency of other specified B group vitamins: Secondary | ICD-10-CM | POA: Diagnosis not present

## 2022-07-16 DIAGNOSIS — M6281 Muscle weakness (generalized): Secondary | ICD-10-CM | POA: Diagnosis not present

## 2022-07-16 DIAGNOSIS — E7849 Other hyperlipidemia: Secondary | ICD-10-CM | POA: Diagnosis not present

## 2022-07-18 DIAGNOSIS — I639 Cerebral infarction, unspecified: Secondary | ICD-10-CM | POA: Diagnosis not present

## 2022-07-18 DIAGNOSIS — M6282 Rhabdomyolysis: Secondary | ICD-10-CM | POA: Diagnosis not present

## 2022-07-18 DIAGNOSIS — G20 Parkinson's disease: Secondary | ICD-10-CM | POA: Diagnosis not present

## 2022-07-18 DIAGNOSIS — R269 Unspecified abnormalities of gait and mobility: Secondary | ICD-10-CM | POA: Diagnosis not present

## 2022-07-21 DIAGNOSIS — I10 Essential (primary) hypertension: Secondary | ICD-10-CM | POA: Diagnosis not present

## 2022-07-21 DIAGNOSIS — E7849 Other hyperlipidemia: Secondary | ICD-10-CM | POA: Diagnosis not present

## 2022-07-21 DIAGNOSIS — E538 Deficiency of other specified B group vitamins: Secondary | ICD-10-CM | POA: Diagnosis not present

## 2022-07-21 DIAGNOSIS — M6281 Muscle weakness (generalized): Secondary | ICD-10-CM | POA: Diagnosis not present

## 2022-07-21 DIAGNOSIS — Z8616 Personal history of COVID-19: Secondary | ICD-10-CM | POA: Diagnosis not present

## 2022-07-21 DIAGNOSIS — E43 Unspecified severe protein-calorie malnutrition: Secondary | ICD-10-CM | POA: Diagnosis not present

## 2022-07-21 DIAGNOSIS — R1312 Dysphagia, oropharyngeal phase: Secondary | ICD-10-CM | POA: Diagnosis not present

## 2022-07-22 DIAGNOSIS — E43 Unspecified severe protein-calorie malnutrition: Secondary | ICD-10-CM | POA: Diagnosis not present

## 2022-07-22 DIAGNOSIS — E7849 Other hyperlipidemia: Secondary | ICD-10-CM | POA: Diagnosis not present

## 2022-07-22 DIAGNOSIS — Z8616 Personal history of COVID-19: Secondary | ICD-10-CM | POA: Diagnosis not present

## 2022-07-22 DIAGNOSIS — I10 Essential (primary) hypertension: Secondary | ICD-10-CM | POA: Diagnosis not present

## 2022-07-22 DIAGNOSIS — M6281 Muscle weakness (generalized): Secondary | ICD-10-CM | POA: Diagnosis not present

## 2022-07-22 DIAGNOSIS — R1312 Dysphagia, oropharyngeal phase: Secondary | ICD-10-CM | POA: Diagnosis not present

## 2022-07-22 DIAGNOSIS — F02B18 Dementia in other diseases classified elsewhere, moderate, with other behavioral disturbance: Secondary | ICD-10-CM | POA: Diagnosis not present

## 2022-07-22 DIAGNOSIS — G20 Parkinson's disease: Secondary | ICD-10-CM | POA: Diagnosis not present

## 2022-07-22 DIAGNOSIS — E538 Deficiency of other specified B group vitamins: Secondary | ICD-10-CM | POA: Diagnosis not present

## 2022-07-23 DIAGNOSIS — R1312 Dysphagia, oropharyngeal phase: Secondary | ICD-10-CM | POA: Diagnosis not present

## 2022-07-23 DIAGNOSIS — I10 Essential (primary) hypertension: Secondary | ICD-10-CM | POA: Diagnosis not present

## 2022-07-23 DIAGNOSIS — Z8616 Personal history of COVID-19: Secondary | ICD-10-CM | POA: Diagnosis not present

## 2022-07-23 DIAGNOSIS — E7849 Other hyperlipidemia: Secondary | ICD-10-CM | POA: Diagnosis not present

## 2022-07-23 DIAGNOSIS — M6281 Muscle weakness (generalized): Secondary | ICD-10-CM | POA: Diagnosis not present

## 2022-07-23 DIAGNOSIS — E538 Deficiency of other specified B group vitamins: Secondary | ICD-10-CM | POA: Diagnosis not present

## 2022-07-23 DIAGNOSIS — E43 Unspecified severe protein-calorie malnutrition: Secondary | ICD-10-CM | POA: Diagnosis not present

## 2022-07-24 DIAGNOSIS — Z8616 Personal history of COVID-19: Secondary | ICD-10-CM | POA: Diagnosis not present

## 2022-07-24 DIAGNOSIS — I10 Essential (primary) hypertension: Secondary | ICD-10-CM | POA: Diagnosis not present

## 2022-07-24 DIAGNOSIS — E538 Deficiency of other specified B group vitamins: Secondary | ICD-10-CM | POA: Diagnosis not present

## 2022-07-24 DIAGNOSIS — R1312 Dysphagia, oropharyngeal phase: Secondary | ICD-10-CM | POA: Diagnosis not present

## 2022-07-24 DIAGNOSIS — E43 Unspecified severe protein-calorie malnutrition: Secondary | ICD-10-CM | POA: Diagnosis not present

## 2022-07-24 DIAGNOSIS — E7849 Other hyperlipidemia: Secondary | ICD-10-CM | POA: Diagnosis not present

## 2022-07-24 DIAGNOSIS — M6281 Muscle weakness (generalized): Secondary | ICD-10-CM | POA: Diagnosis not present

## 2022-07-25 DIAGNOSIS — R1312 Dysphagia, oropharyngeal phase: Secondary | ICD-10-CM | POA: Diagnosis not present

## 2022-07-25 DIAGNOSIS — M6281 Muscle weakness (generalized): Secondary | ICD-10-CM | POA: Diagnosis not present

## 2022-07-25 DIAGNOSIS — E538 Deficiency of other specified B group vitamins: Secondary | ICD-10-CM | POA: Diagnosis not present

## 2022-07-25 DIAGNOSIS — I10 Essential (primary) hypertension: Secondary | ICD-10-CM | POA: Diagnosis not present

## 2022-07-25 DIAGNOSIS — E7849 Other hyperlipidemia: Secondary | ICD-10-CM | POA: Diagnosis not present

## 2022-07-25 DIAGNOSIS — Z8616 Personal history of COVID-19: Secondary | ICD-10-CM | POA: Diagnosis not present

## 2022-07-25 DIAGNOSIS — E43 Unspecified severe protein-calorie malnutrition: Secondary | ICD-10-CM | POA: Diagnosis not present

## 2022-07-28 DIAGNOSIS — I10 Essential (primary) hypertension: Secondary | ICD-10-CM | POA: Diagnosis not present

## 2022-07-28 DIAGNOSIS — E43 Unspecified severe protein-calorie malnutrition: Secondary | ICD-10-CM | POA: Diagnosis not present

## 2022-07-28 DIAGNOSIS — G20A1 Parkinson's disease without dyskinesia, without mention of fluctuations: Secondary | ICD-10-CM | POA: Diagnosis not present

## 2022-07-28 DIAGNOSIS — R1312 Dysphagia, oropharyngeal phase: Secondary | ICD-10-CM | POA: Diagnosis not present

## 2022-07-28 DIAGNOSIS — E7849 Other hyperlipidemia: Secondary | ICD-10-CM | POA: Diagnosis not present

## 2022-07-28 DIAGNOSIS — M6281 Muscle weakness (generalized): Secondary | ICD-10-CM | POA: Diagnosis not present

## 2022-07-28 DIAGNOSIS — E538 Deficiency of other specified B group vitamins: Secondary | ICD-10-CM | POA: Diagnosis not present

## 2022-07-29 DIAGNOSIS — E538 Deficiency of other specified B group vitamins: Secondary | ICD-10-CM | POA: Diagnosis not present

## 2022-07-29 DIAGNOSIS — M6281 Muscle weakness (generalized): Secondary | ICD-10-CM | POA: Diagnosis not present

## 2022-07-29 DIAGNOSIS — E7849 Other hyperlipidemia: Secondary | ICD-10-CM | POA: Diagnosis not present

## 2022-07-29 DIAGNOSIS — R1312 Dysphagia, oropharyngeal phase: Secondary | ICD-10-CM | POA: Diagnosis not present

## 2022-07-29 DIAGNOSIS — G20A1 Parkinson's disease without dyskinesia, without mention of fluctuations: Secondary | ICD-10-CM | POA: Diagnosis not present

## 2022-07-29 DIAGNOSIS — E43 Unspecified severe protein-calorie malnutrition: Secondary | ICD-10-CM | POA: Diagnosis not present

## 2022-07-29 DIAGNOSIS — I10 Essential (primary) hypertension: Secondary | ICD-10-CM | POA: Diagnosis not present

## 2022-07-30 DIAGNOSIS — E538 Deficiency of other specified B group vitamins: Secondary | ICD-10-CM | POA: Diagnosis not present

## 2022-07-30 DIAGNOSIS — R1312 Dysphagia, oropharyngeal phase: Secondary | ICD-10-CM | POA: Diagnosis not present

## 2022-07-30 DIAGNOSIS — E440 Moderate protein-calorie malnutrition: Secondary | ICD-10-CM | POA: Diagnosis not present

## 2022-07-30 DIAGNOSIS — M6281 Muscle weakness (generalized): Secondary | ICD-10-CM | POA: Diagnosis not present

## 2022-07-30 DIAGNOSIS — I10 Essential (primary) hypertension: Secondary | ICD-10-CM | POA: Diagnosis not present

## 2022-07-30 DIAGNOSIS — G20A1 Parkinson's disease without dyskinesia, without mention of fluctuations: Secondary | ICD-10-CM | POA: Diagnosis not present

## 2022-07-30 DIAGNOSIS — E43 Unspecified severe protein-calorie malnutrition: Secondary | ICD-10-CM | POA: Diagnosis not present

## 2022-07-30 DIAGNOSIS — K219 Gastro-esophageal reflux disease without esophagitis: Secondary | ICD-10-CM | POA: Diagnosis not present

## 2022-07-30 DIAGNOSIS — E7849 Other hyperlipidemia: Secondary | ICD-10-CM | POA: Diagnosis not present

## 2022-07-31 DIAGNOSIS — E43 Unspecified severe protein-calorie malnutrition: Secondary | ICD-10-CM | POA: Diagnosis not present

## 2022-07-31 DIAGNOSIS — R1312 Dysphagia, oropharyngeal phase: Secondary | ICD-10-CM | POA: Diagnosis not present

## 2022-07-31 DIAGNOSIS — E538 Deficiency of other specified B group vitamins: Secondary | ICD-10-CM | POA: Diagnosis not present

## 2022-07-31 DIAGNOSIS — G20A1 Parkinson's disease without dyskinesia, without mention of fluctuations: Secondary | ICD-10-CM | POA: Diagnosis not present

## 2022-07-31 DIAGNOSIS — I10 Essential (primary) hypertension: Secondary | ICD-10-CM | POA: Diagnosis not present

## 2022-07-31 DIAGNOSIS — M6281 Muscle weakness (generalized): Secondary | ICD-10-CM | POA: Diagnosis not present

## 2022-07-31 DIAGNOSIS — E7849 Other hyperlipidemia: Secondary | ICD-10-CM | POA: Diagnosis not present

## 2022-08-01 DIAGNOSIS — M6281 Muscle weakness (generalized): Secondary | ICD-10-CM | POA: Diagnosis not present

## 2022-08-01 DIAGNOSIS — E538 Deficiency of other specified B group vitamins: Secondary | ICD-10-CM | POA: Diagnosis not present

## 2022-08-01 DIAGNOSIS — R1312 Dysphagia, oropharyngeal phase: Secondary | ICD-10-CM | POA: Diagnosis not present

## 2022-08-01 DIAGNOSIS — E7849 Other hyperlipidemia: Secondary | ICD-10-CM | POA: Diagnosis not present

## 2022-08-01 DIAGNOSIS — E43 Unspecified severe protein-calorie malnutrition: Secondary | ICD-10-CM | POA: Diagnosis not present

## 2022-08-01 DIAGNOSIS — I10 Essential (primary) hypertension: Secondary | ICD-10-CM | POA: Diagnosis not present

## 2022-08-01 DIAGNOSIS — G20A1 Parkinson's disease without dyskinesia, without mention of fluctuations: Secondary | ICD-10-CM | POA: Diagnosis not present

## 2022-08-05 DIAGNOSIS — I10 Essential (primary) hypertension: Secondary | ICD-10-CM | POA: Diagnosis not present

## 2022-08-14 DIAGNOSIS — I10 Essential (primary) hypertension: Secondary | ICD-10-CM | POA: Diagnosis not present

## 2022-08-14 DIAGNOSIS — K219 Gastro-esophageal reflux disease without esophagitis: Secondary | ICD-10-CM | POA: Diagnosis not present

## 2022-08-14 DIAGNOSIS — E440 Moderate protein-calorie malnutrition: Secondary | ICD-10-CM | POA: Diagnosis not present

## 2022-08-14 DIAGNOSIS — G20A1 Parkinson's disease without dyskinesia, without mention of fluctuations: Secondary | ICD-10-CM | POA: Diagnosis not present

## 2022-08-15 DIAGNOSIS — I639 Cerebral infarction, unspecified: Secondary | ICD-10-CM | POA: Diagnosis not present

## 2022-08-15 DIAGNOSIS — E43 Unspecified severe protein-calorie malnutrition: Secondary | ICD-10-CM | POA: Diagnosis not present

## 2022-08-15 DIAGNOSIS — M6282 Rhabdomyolysis: Secondary | ICD-10-CM | POA: Diagnosis not present

## 2022-08-15 DIAGNOSIS — R269 Unspecified abnormalities of gait and mobility: Secondary | ICD-10-CM | POA: Diagnosis not present

## 2022-08-19 DIAGNOSIS — F02B18 Dementia in other diseases classified elsewhere, moderate, with other behavioral disturbance: Secondary | ICD-10-CM | POA: Diagnosis not present

## 2022-08-19 DIAGNOSIS — G20C Parkinsonism, unspecified: Secondary | ICD-10-CM | POA: Diagnosis not present

## 2022-08-21 DIAGNOSIS — G20A1 Parkinson's disease without dyskinesia, without mention of fluctuations: Secondary | ICD-10-CM | POA: Diagnosis not present

## 2022-08-21 DIAGNOSIS — I10 Essential (primary) hypertension: Secondary | ICD-10-CM | POA: Diagnosis not present

## 2022-08-21 DIAGNOSIS — R296 Repeated falls: Secondary | ICD-10-CM | POA: Diagnosis not present

## 2022-08-26 DIAGNOSIS — K219 Gastro-esophageal reflux disease without esophagitis: Secondary | ICD-10-CM | POA: Diagnosis not present

## 2022-08-26 DIAGNOSIS — I10 Essential (primary) hypertension: Secondary | ICD-10-CM | POA: Diagnosis not present

## 2022-08-26 DIAGNOSIS — E440 Moderate protein-calorie malnutrition: Secondary | ICD-10-CM | POA: Diagnosis not present

## 2022-08-28 DIAGNOSIS — R1312 Dysphagia, oropharyngeal phase: Secondary | ICD-10-CM | POA: Diagnosis not present

## 2022-08-28 DIAGNOSIS — G20A1 Parkinson's disease without dyskinesia, without mention of fluctuations: Secondary | ICD-10-CM | POA: Diagnosis not present

## 2022-08-28 DIAGNOSIS — I10 Essential (primary) hypertension: Secondary | ICD-10-CM | POA: Diagnosis not present

## 2022-08-28 DIAGNOSIS — M6281 Muscle weakness (generalized): Secondary | ICD-10-CM | POA: Diagnosis not present

## 2022-08-28 DIAGNOSIS — E43 Unspecified severe protein-calorie malnutrition: Secondary | ICD-10-CM | POA: Diagnosis not present

## 2022-08-28 DIAGNOSIS — E538 Deficiency of other specified B group vitamins: Secondary | ICD-10-CM | POA: Diagnosis not present

## 2022-08-28 DIAGNOSIS — E7849 Other hyperlipidemia: Secondary | ICD-10-CM | POA: Diagnosis not present

## 2022-08-29 DIAGNOSIS — E440 Moderate protein-calorie malnutrition: Secondary | ICD-10-CM | POA: Diagnosis not present

## 2022-08-29 DIAGNOSIS — M6281 Muscle weakness (generalized): Secondary | ICD-10-CM | POA: Diagnosis not present

## 2022-08-29 DIAGNOSIS — E538 Deficiency of other specified B group vitamins: Secondary | ICD-10-CM | POA: Diagnosis not present

## 2022-08-29 DIAGNOSIS — E7849 Other hyperlipidemia: Secondary | ICD-10-CM | POA: Diagnosis not present

## 2022-08-29 DIAGNOSIS — R1312 Dysphagia, oropharyngeal phase: Secondary | ICD-10-CM | POA: Diagnosis not present

## 2022-08-29 DIAGNOSIS — I10 Essential (primary) hypertension: Secondary | ICD-10-CM | POA: Diagnosis not present

## 2022-08-29 DIAGNOSIS — E43 Unspecified severe protein-calorie malnutrition: Secondary | ICD-10-CM | POA: Diagnosis not present

## 2022-08-29 DIAGNOSIS — G20A1 Parkinson's disease without dyskinesia, without mention of fluctuations: Secondary | ICD-10-CM | POA: Diagnosis not present

## 2022-08-31 DIAGNOSIS — E440 Moderate protein-calorie malnutrition: Secondary | ICD-10-CM | POA: Diagnosis not present

## 2022-08-31 DIAGNOSIS — R296 Repeated falls: Secondary | ICD-10-CM | POA: Diagnosis not present

## 2022-08-31 DIAGNOSIS — G20A1 Parkinson's disease without dyskinesia, without mention of fluctuations: Secondary | ICD-10-CM | POA: Diagnosis not present

## 2022-08-31 DIAGNOSIS — K5909 Other constipation: Secondary | ICD-10-CM | POA: Diagnosis not present

## 2022-08-31 DIAGNOSIS — I10 Essential (primary) hypertension: Secondary | ICD-10-CM | POA: Diagnosis not present

## 2022-08-31 DIAGNOSIS — K219 Gastro-esophageal reflux disease without esophagitis: Secondary | ICD-10-CM | POA: Diagnosis not present

## 2022-09-01 DIAGNOSIS — G20A1 Parkinson's disease without dyskinesia, without mention of fluctuations: Secondary | ICD-10-CM | POA: Diagnosis not present

## 2022-09-01 DIAGNOSIS — E538 Deficiency of other specified B group vitamins: Secondary | ICD-10-CM | POA: Diagnosis not present

## 2022-09-01 DIAGNOSIS — E43 Unspecified severe protein-calorie malnutrition: Secondary | ICD-10-CM | POA: Diagnosis not present

## 2022-09-01 DIAGNOSIS — I10 Essential (primary) hypertension: Secondary | ICD-10-CM | POA: Diagnosis not present

## 2022-09-01 DIAGNOSIS — E7849 Other hyperlipidemia: Secondary | ICD-10-CM | POA: Diagnosis not present

## 2022-09-01 DIAGNOSIS — M6281 Muscle weakness (generalized): Secondary | ICD-10-CM | POA: Diagnosis not present

## 2022-09-01 DIAGNOSIS — R1312 Dysphagia, oropharyngeal phase: Secondary | ICD-10-CM | POA: Diagnosis not present

## 2022-09-02 DIAGNOSIS — E538 Deficiency of other specified B group vitamins: Secondary | ICD-10-CM | POA: Diagnosis not present

## 2022-09-02 DIAGNOSIS — G20A1 Parkinson's disease without dyskinesia, without mention of fluctuations: Secondary | ICD-10-CM | POA: Diagnosis not present

## 2022-09-02 DIAGNOSIS — M6281 Muscle weakness (generalized): Secondary | ICD-10-CM | POA: Diagnosis not present

## 2022-09-02 DIAGNOSIS — R1312 Dysphagia, oropharyngeal phase: Secondary | ICD-10-CM | POA: Diagnosis not present

## 2022-09-02 DIAGNOSIS — E7849 Other hyperlipidemia: Secondary | ICD-10-CM | POA: Diagnosis not present

## 2022-09-02 DIAGNOSIS — E43 Unspecified severe protein-calorie malnutrition: Secondary | ICD-10-CM | POA: Diagnosis not present

## 2022-09-02 DIAGNOSIS — I10 Essential (primary) hypertension: Secondary | ICD-10-CM | POA: Diagnosis not present

## 2022-09-03 DIAGNOSIS — M6281 Muscle weakness (generalized): Secondary | ICD-10-CM | POA: Diagnosis not present

## 2022-09-03 DIAGNOSIS — G20A1 Parkinson's disease without dyskinesia, without mention of fluctuations: Secondary | ICD-10-CM | POA: Diagnosis not present

## 2022-09-03 DIAGNOSIS — E538 Deficiency of other specified B group vitamins: Secondary | ICD-10-CM | POA: Diagnosis not present

## 2022-09-03 DIAGNOSIS — E43 Unspecified severe protein-calorie malnutrition: Secondary | ICD-10-CM | POA: Diagnosis not present

## 2022-09-03 DIAGNOSIS — R1312 Dysphagia, oropharyngeal phase: Secondary | ICD-10-CM | POA: Diagnosis not present

## 2022-09-03 DIAGNOSIS — E7849 Other hyperlipidemia: Secondary | ICD-10-CM | POA: Diagnosis not present

## 2022-09-03 DIAGNOSIS — I10 Essential (primary) hypertension: Secondary | ICD-10-CM | POA: Diagnosis not present

## 2022-09-04 DIAGNOSIS — G20A1 Parkinson's disease without dyskinesia, without mention of fluctuations: Secondary | ICD-10-CM | POA: Diagnosis not present

## 2022-09-04 DIAGNOSIS — E538 Deficiency of other specified B group vitamins: Secondary | ICD-10-CM | POA: Diagnosis not present

## 2022-09-04 DIAGNOSIS — R1312 Dysphagia, oropharyngeal phase: Secondary | ICD-10-CM | POA: Diagnosis not present

## 2022-09-04 DIAGNOSIS — E43 Unspecified severe protein-calorie malnutrition: Secondary | ICD-10-CM | POA: Diagnosis not present

## 2022-09-04 DIAGNOSIS — M6281 Muscle weakness (generalized): Secondary | ICD-10-CM | POA: Diagnosis not present

## 2022-09-04 DIAGNOSIS — E7849 Other hyperlipidemia: Secondary | ICD-10-CM | POA: Diagnosis not present

## 2022-09-04 DIAGNOSIS — I10 Essential (primary) hypertension: Secondary | ICD-10-CM | POA: Diagnosis not present

## 2022-09-05 DIAGNOSIS — G20A1 Parkinson's disease without dyskinesia, without mention of fluctuations: Secondary | ICD-10-CM | POA: Diagnosis not present

## 2022-09-05 DIAGNOSIS — E538 Deficiency of other specified B group vitamins: Secondary | ICD-10-CM | POA: Diagnosis not present

## 2022-09-05 DIAGNOSIS — R1312 Dysphagia, oropharyngeal phase: Secondary | ICD-10-CM | POA: Diagnosis not present

## 2022-09-05 DIAGNOSIS — I639 Cerebral infarction, unspecified: Secondary | ICD-10-CM | POA: Diagnosis not present

## 2022-09-05 DIAGNOSIS — E43 Unspecified severe protein-calorie malnutrition: Secondary | ICD-10-CM | POA: Diagnosis not present

## 2022-09-05 DIAGNOSIS — R269 Unspecified abnormalities of gait and mobility: Secondary | ICD-10-CM | POA: Diagnosis not present

## 2022-09-05 DIAGNOSIS — M6281 Muscle weakness (generalized): Secondary | ICD-10-CM | POA: Diagnosis not present

## 2022-09-05 DIAGNOSIS — I10 Essential (primary) hypertension: Secondary | ICD-10-CM | POA: Diagnosis not present

## 2022-09-05 DIAGNOSIS — M6282 Rhabdomyolysis: Secondary | ICD-10-CM | POA: Diagnosis not present

## 2022-09-05 DIAGNOSIS — E7849 Other hyperlipidemia: Secondary | ICD-10-CM | POA: Diagnosis not present

## 2022-09-06 DIAGNOSIS — E538 Deficiency of other specified B group vitamins: Secondary | ICD-10-CM | POA: Diagnosis not present

## 2022-09-06 DIAGNOSIS — E43 Unspecified severe protein-calorie malnutrition: Secondary | ICD-10-CM | POA: Diagnosis not present

## 2022-09-06 DIAGNOSIS — E7849 Other hyperlipidemia: Secondary | ICD-10-CM | POA: Diagnosis not present

## 2022-09-06 DIAGNOSIS — R1312 Dysphagia, oropharyngeal phase: Secondary | ICD-10-CM | POA: Diagnosis not present

## 2022-09-06 DIAGNOSIS — I10 Essential (primary) hypertension: Secondary | ICD-10-CM | POA: Diagnosis not present

## 2022-09-06 DIAGNOSIS — M6281 Muscle weakness (generalized): Secondary | ICD-10-CM | POA: Diagnosis not present

## 2022-09-06 DIAGNOSIS — G20A1 Parkinson's disease without dyskinesia, without mention of fluctuations: Secondary | ICD-10-CM | POA: Diagnosis not present

## 2022-09-07 DIAGNOSIS — E538 Deficiency of other specified B group vitamins: Secondary | ICD-10-CM | POA: Diagnosis not present

## 2022-09-07 DIAGNOSIS — E43 Unspecified severe protein-calorie malnutrition: Secondary | ICD-10-CM | POA: Diagnosis not present

## 2022-09-07 DIAGNOSIS — I10 Essential (primary) hypertension: Secondary | ICD-10-CM | POA: Diagnosis not present

## 2022-09-07 DIAGNOSIS — M6281 Muscle weakness (generalized): Secondary | ICD-10-CM | POA: Diagnosis not present

## 2022-09-07 DIAGNOSIS — G20A1 Parkinson's disease without dyskinesia, without mention of fluctuations: Secondary | ICD-10-CM | POA: Diagnosis not present

## 2022-09-07 DIAGNOSIS — R1312 Dysphagia, oropharyngeal phase: Secondary | ICD-10-CM | POA: Diagnosis not present

## 2022-09-07 DIAGNOSIS — R296 Repeated falls: Secondary | ICD-10-CM | POA: Diagnosis not present

## 2022-09-07 DIAGNOSIS — K219 Gastro-esophageal reflux disease without esophagitis: Secondary | ICD-10-CM | POA: Diagnosis not present

## 2022-09-07 DIAGNOSIS — E7849 Other hyperlipidemia: Secondary | ICD-10-CM | POA: Diagnosis not present

## 2022-09-07 DIAGNOSIS — K5909 Other constipation: Secondary | ICD-10-CM | POA: Diagnosis not present

## 2022-09-08 DIAGNOSIS — G20A1 Parkinson's disease without dyskinesia, without mention of fluctuations: Secondary | ICD-10-CM | POA: Diagnosis not present

## 2022-09-08 DIAGNOSIS — M6281 Muscle weakness (generalized): Secondary | ICD-10-CM | POA: Diagnosis not present

## 2022-09-08 DIAGNOSIS — E7849 Other hyperlipidemia: Secondary | ICD-10-CM | POA: Diagnosis not present

## 2022-09-08 DIAGNOSIS — E43 Unspecified severe protein-calorie malnutrition: Secondary | ICD-10-CM | POA: Diagnosis not present

## 2022-09-08 DIAGNOSIS — E538 Deficiency of other specified B group vitamins: Secondary | ICD-10-CM | POA: Diagnosis not present

## 2022-09-08 DIAGNOSIS — I10 Essential (primary) hypertension: Secondary | ICD-10-CM | POA: Diagnosis not present

## 2022-09-08 DIAGNOSIS — R1312 Dysphagia, oropharyngeal phase: Secondary | ICD-10-CM | POA: Diagnosis not present

## 2022-09-09 DIAGNOSIS — R1312 Dysphagia, oropharyngeal phase: Secondary | ICD-10-CM | POA: Diagnosis not present

## 2022-09-09 DIAGNOSIS — I10 Essential (primary) hypertension: Secondary | ICD-10-CM | POA: Diagnosis not present

## 2022-09-09 DIAGNOSIS — E538 Deficiency of other specified B group vitamins: Secondary | ICD-10-CM | POA: Diagnosis not present

## 2022-09-09 DIAGNOSIS — E7849 Other hyperlipidemia: Secondary | ICD-10-CM | POA: Diagnosis not present

## 2022-09-09 DIAGNOSIS — M6281 Muscle weakness (generalized): Secondary | ICD-10-CM | POA: Diagnosis not present

## 2022-09-09 DIAGNOSIS — E43 Unspecified severe protein-calorie malnutrition: Secondary | ICD-10-CM | POA: Diagnosis not present

## 2022-09-09 DIAGNOSIS — G20A1 Parkinson's disease without dyskinesia, without mention of fluctuations: Secondary | ICD-10-CM | POA: Diagnosis not present

## 2022-09-10 DIAGNOSIS — E7849 Other hyperlipidemia: Secondary | ICD-10-CM | POA: Diagnosis not present

## 2022-09-10 DIAGNOSIS — E43 Unspecified severe protein-calorie malnutrition: Secondary | ICD-10-CM | POA: Diagnosis not present

## 2022-09-10 DIAGNOSIS — M6281 Muscle weakness (generalized): Secondary | ICD-10-CM | POA: Diagnosis not present

## 2022-09-10 DIAGNOSIS — G20A1 Parkinson's disease without dyskinesia, without mention of fluctuations: Secondary | ICD-10-CM | POA: Diagnosis not present

## 2022-09-10 DIAGNOSIS — E538 Deficiency of other specified B group vitamins: Secondary | ICD-10-CM | POA: Diagnosis not present

## 2022-09-10 DIAGNOSIS — I10 Essential (primary) hypertension: Secondary | ICD-10-CM | POA: Diagnosis not present

## 2022-09-10 DIAGNOSIS — R1312 Dysphagia, oropharyngeal phase: Secondary | ICD-10-CM | POA: Diagnosis not present

## 2022-09-11 DIAGNOSIS — I10 Essential (primary) hypertension: Secondary | ICD-10-CM | POA: Diagnosis not present

## 2022-09-11 DIAGNOSIS — M6281 Muscle weakness (generalized): Secondary | ICD-10-CM | POA: Diagnosis not present

## 2022-09-11 DIAGNOSIS — R1312 Dysphagia, oropharyngeal phase: Secondary | ICD-10-CM | POA: Diagnosis not present

## 2022-09-11 DIAGNOSIS — E43 Unspecified severe protein-calorie malnutrition: Secondary | ICD-10-CM | POA: Diagnosis not present

## 2022-09-11 DIAGNOSIS — E538 Deficiency of other specified B group vitamins: Secondary | ICD-10-CM | POA: Diagnosis not present

## 2022-09-11 DIAGNOSIS — G20A1 Parkinson's disease without dyskinesia, without mention of fluctuations: Secondary | ICD-10-CM | POA: Diagnosis not present

## 2022-09-11 DIAGNOSIS — E7849 Other hyperlipidemia: Secondary | ICD-10-CM | POA: Diagnosis not present

## 2022-09-12 DIAGNOSIS — E538 Deficiency of other specified B group vitamins: Secondary | ICD-10-CM | POA: Diagnosis not present

## 2022-09-12 DIAGNOSIS — G20A1 Parkinson's disease without dyskinesia, without mention of fluctuations: Secondary | ICD-10-CM | POA: Diagnosis not present

## 2022-09-12 DIAGNOSIS — M6281 Muscle weakness (generalized): Secondary | ICD-10-CM | POA: Diagnosis not present

## 2022-09-12 DIAGNOSIS — R1312 Dysphagia, oropharyngeal phase: Secondary | ICD-10-CM | POA: Diagnosis not present

## 2022-09-12 DIAGNOSIS — I10 Essential (primary) hypertension: Secondary | ICD-10-CM | POA: Diagnosis not present

## 2022-09-12 DIAGNOSIS — E7849 Other hyperlipidemia: Secondary | ICD-10-CM | POA: Diagnosis not present

## 2022-09-12 DIAGNOSIS — K219 Gastro-esophageal reflux disease without esophagitis: Secondary | ICD-10-CM | POA: Diagnosis not present

## 2022-09-12 DIAGNOSIS — E43 Unspecified severe protein-calorie malnutrition: Secondary | ICD-10-CM | POA: Diagnosis not present

## 2022-09-14 DIAGNOSIS — M6281 Muscle weakness (generalized): Secondary | ICD-10-CM | POA: Diagnosis not present

## 2022-09-14 DIAGNOSIS — E538 Deficiency of other specified B group vitamins: Secondary | ICD-10-CM | POA: Diagnosis not present

## 2022-09-14 DIAGNOSIS — G20A1 Parkinson's disease without dyskinesia, without mention of fluctuations: Secondary | ICD-10-CM | POA: Diagnosis not present

## 2022-09-14 DIAGNOSIS — E7849 Other hyperlipidemia: Secondary | ICD-10-CM | POA: Diagnosis not present

## 2022-09-14 DIAGNOSIS — I10 Essential (primary) hypertension: Secondary | ICD-10-CM | POA: Diagnosis not present

## 2022-09-14 DIAGNOSIS — R1312 Dysphagia, oropharyngeal phase: Secondary | ICD-10-CM | POA: Diagnosis not present

## 2022-09-14 DIAGNOSIS — E43 Unspecified severe protein-calorie malnutrition: Secondary | ICD-10-CM | POA: Diagnosis not present

## 2022-09-15 DIAGNOSIS — R1312 Dysphagia, oropharyngeal phase: Secondary | ICD-10-CM | POA: Diagnosis not present

## 2022-09-15 DIAGNOSIS — M6281 Muscle weakness (generalized): Secondary | ICD-10-CM | POA: Diagnosis not present

## 2022-09-15 DIAGNOSIS — I10 Essential (primary) hypertension: Secondary | ICD-10-CM | POA: Diagnosis not present

## 2022-09-15 DIAGNOSIS — G20A1 Parkinson's disease without dyskinesia, without mention of fluctuations: Secondary | ICD-10-CM | POA: Diagnosis not present

## 2022-09-15 DIAGNOSIS — E7849 Other hyperlipidemia: Secondary | ICD-10-CM | POA: Diagnosis not present

## 2022-09-15 DIAGNOSIS — E43 Unspecified severe protein-calorie malnutrition: Secondary | ICD-10-CM | POA: Diagnosis not present

## 2022-09-15 DIAGNOSIS — E538 Deficiency of other specified B group vitamins: Secondary | ICD-10-CM | POA: Diagnosis not present

## 2022-09-16 DIAGNOSIS — R1312 Dysphagia, oropharyngeal phase: Secondary | ICD-10-CM | POA: Diagnosis not present

## 2022-09-16 DIAGNOSIS — E538 Deficiency of other specified B group vitamins: Secondary | ICD-10-CM | POA: Diagnosis not present

## 2022-09-16 DIAGNOSIS — E7849 Other hyperlipidemia: Secondary | ICD-10-CM | POA: Diagnosis not present

## 2022-09-16 DIAGNOSIS — F5101 Primary insomnia: Secondary | ICD-10-CM | POA: Diagnosis not present

## 2022-09-16 DIAGNOSIS — M6281 Muscle weakness (generalized): Secondary | ICD-10-CM | POA: Diagnosis not present

## 2022-09-16 DIAGNOSIS — G20C Parkinsonism, unspecified: Secondary | ICD-10-CM | POA: Diagnosis not present

## 2022-09-16 DIAGNOSIS — G20A1 Parkinson's disease without dyskinesia, without mention of fluctuations: Secondary | ICD-10-CM | POA: Diagnosis not present

## 2022-09-16 DIAGNOSIS — F02B18 Dementia in other diseases classified elsewhere, moderate, with other behavioral disturbance: Secondary | ICD-10-CM | POA: Diagnosis not present

## 2022-09-16 DIAGNOSIS — I10 Essential (primary) hypertension: Secondary | ICD-10-CM | POA: Diagnosis not present

## 2022-09-16 DIAGNOSIS — E43 Unspecified severe protein-calorie malnutrition: Secondary | ICD-10-CM | POA: Diagnosis not present

## 2022-09-17 DIAGNOSIS — G20A1 Parkinson's disease without dyskinesia, without mention of fluctuations: Secondary | ICD-10-CM | POA: Diagnosis not present

## 2022-09-17 DIAGNOSIS — E7849 Other hyperlipidemia: Secondary | ICD-10-CM | POA: Diagnosis not present

## 2022-09-17 DIAGNOSIS — E43 Unspecified severe protein-calorie malnutrition: Secondary | ICD-10-CM | POA: Diagnosis not present

## 2022-09-17 DIAGNOSIS — K219 Gastro-esophageal reflux disease without esophagitis: Secondary | ICD-10-CM | POA: Diagnosis not present

## 2022-09-17 DIAGNOSIS — I10 Essential (primary) hypertension: Secondary | ICD-10-CM | POA: Diagnosis not present

## 2022-09-17 DIAGNOSIS — E538 Deficiency of other specified B group vitamins: Secondary | ICD-10-CM | POA: Diagnosis not present

## 2022-09-17 DIAGNOSIS — E440 Moderate protein-calorie malnutrition: Secondary | ICD-10-CM | POA: Diagnosis not present

## 2022-09-17 DIAGNOSIS — M6281 Muscle weakness (generalized): Secondary | ICD-10-CM | POA: Diagnosis not present

## 2022-09-17 DIAGNOSIS — R1312 Dysphagia, oropharyngeal phase: Secondary | ICD-10-CM | POA: Diagnosis not present

## 2022-09-21 DIAGNOSIS — M6281 Muscle weakness (generalized): Secondary | ICD-10-CM | POA: Diagnosis not present

## 2022-09-21 DIAGNOSIS — R1312 Dysphagia, oropharyngeal phase: Secondary | ICD-10-CM | POA: Diagnosis not present

## 2022-09-21 DIAGNOSIS — E43 Unspecified severe protein-calorie malnutrition: Secondary | ICD-10-CM | POA: Diagnosis not present

## 2022-09-21 DIAGNOSIS — E7849 Other hyperlipidemia: Secondary | ICD-10-CM | POA: Diagnosis not present

## 2022-09-21 DIAGNOSIS — G20A1 Parkinson's disease without dyskinesia, without mention of fluctuations: Secondary | ICD-10-CM | POA: Diagnosis not present

## 2022-09-21 DIAGNOSIS — E538 Deficiency of other specified B group vitamins: Secondary | ICD-10-CM | POA: Diagnosis not present

## 2022-09-21 DIAGNOSIS — I10 Essential (primary) hypertension: Secondary | ICD-10-CM | POA: Diagnosis not present

## 2022-09-22 DIAGNOSIS — G20A1 Parkinson's disease without dyskinesia, without mention of fluctuations: Secondary | ICD-10-CM | POA: Diagnosis not present

## 2022-09-22 DIAGNOSIS — E43 Unspecified severe protein-calorie malnutrition: Secondary | ICD-10-CM | POA: Diagnosis not present

## 2022-09-22 DIAGNOSIS — I10 Essential (primary) hypertension: Secondary | ICD-10-CM | POA: Diagnosis not present

## 2022-09-22 DIAGNOSIS — M6281 Muscle weakness (generalized): Secondary | ICD-10-CM | POA: Diagnosis not present

## 2022-09-22 DIAGNOSIS — R1312 Dysphagia, oropharyngeal phase: Secondary | ICD-10-CM | POA: Diagnosis not present

## 2022-09-22 DIAGNOSIS — E7849 Other hyperlipidemia: Secondary | ICD-10-CM | POA: Diagnosis not present

## 2022-09-22 DIAGNOSIS — E538 Deficiency of other specified B group vitamins: Secondary | ICD-10-CM | POA: Diagnosis not present

## 2022-09-23 DIAGNOSIS — G20A1 Parkinson's disease without dyskinesia, without mention of fluctuations: Secondary | ICD-10-CM | POA: Diagnosis not present

## 2022-09-23 DIAGNOSIS — E7849 Other hyperlipidemia: Secondary | ICD-10-CM | POA: Diagnosis not present

## 2022-09-23 DIAGNOSIS — E43 Unspecified severe protein-calorie malnutrition: Secondary | ICD-10-CM | POA: Diagnosis not present

## 2022-09-23 DIAGNOSIS — R1312 Dysphagia, oropharyngeal phase: Secondary | ICD-10-CM | POA: Diagnosis not present

## 2022-09-23 DIAGNOSIS — E538 Deficiency of other specified B group vitamins: Secondary | ICD-10-CM | POA: Diagnosis not present

## 2022-09-23 DIAGNOSIS — I10 Essential (primary) hypertension: Secondary | ICD-10-CM | POA: Diagnosis not present

## 2022-09-23 DIAGNOSIS — M6281 Muscle weakness (generalized): Secondary | ICD-10-CM | POA: Diagnosis not present

## 2022-09-24 DIAGNOSIS — G20A1 Parkinson's disease without dyskinesia, without mention of fluctuations: Secondary | ICD-10-CM | POA: Diagnosis not present

## 2022-09-24 DIAGNOSIS — E7849 Other hyperlipidemia: Secondary | ICD-10-CM | POA: Diagnosis not present

## 2022-09-24 DIAGNOSIS — I10 Essential (primary) hypertension: Secondary | ICD-10-CM | POA: Diagnosis not present

## 2022-09-24 DIAGNOSIS — R1312 Dysphagia, oropharyngeal phase: Secondary | ICD-10-CM | POA: Diagnosis not present

## 2022-09-24 DIAGNOSIS — M6281 Muscle weakness (generalized): Secondary | ICD-10-CM | POA: Diagnosis not present

## 2022-09-24 DIAGNOSIS — E43 Unspecified severe protein-calorie malnutrition: Secondary | ICD-10-CM | POA: Diagnosis not present

## 2022-09-24 DIAGNOSIS — E538 Deficiency of other specified B group vitamins: Secondary | ICD-10-CM | POA: Diagnosis not present

## 2022-09-25 DIAGNOSIS — I10 Essential (primary) hypertension: Secondary | ICD-10-CM | POA: Diagnosis not present

## 2022-09-25 DIAGNOSIS — G20A1 Parkinson's disease without dyskinesia, without mention of fluctuations: Secondary | ICD-10-CM | POA: Diagnosis not present

## 2022-09-25 DIAGNOSIS — R1312 Dysphagia, oropharyngeal phase: Secondary | ICD-10-CM | POA: Diagnosis not present

## 2022-09-25 DIAGNOSIS — E538 Deficiency of other specified B group vitamins: Secondary | ICD-10-CM | POA: Diagnosis not present

## 2022-09-25 DIAGNOSIS — E43 Unspecified severe protein-calorie malnutrition: Secondary | ICD-10-CM | POA: Diagnosis not present

## 2022-09-25 DIAGNOSIS — E7849 Other hyperlipidemia: Secondary | ICD-10-CM | POA: Diagnosis not present

## 2022-09-25 DIAGNOSIS — M6281 Muscle weakness (generalized): Secondary | ICD-10-CM | POA: Diagnosis not present

## 2022-09-26 DIAGNOSIS — E538 Deficiency of other specified B group vitamins: Secondary | ICD-10-CM | POA: Diagnosis not present

## 2022-09-26 DIAGNOSIS — E43 Unspecified severe protein-calorie malnutrition: Secondary | ICD-10-CM | POA: Diagnosis not present

## 2022-09-26 DIAGNOSIS — M6281 Muscle weakness (generalized): Secondary | ICD-10-CM | POA: Diagnosis not present

## 2022-09-26 DIAGNOSIS — L603 Nail dystrophy: Secondary | ICD-10-CM | POA: Diagnosis not present

## 2022-09-26 DIAGNOSIS — R1312 Dysphagia, oropharyngeal phase: Secondary | ICD-10-CM | POA: Diagnosis not present

## 2022-09-26 DIAGNOSIS — G20A1 Parkinson's disease without dyskinesia, without mention of fluctuations: Secondary | ICD-10-CM | POA: Diagnosis not present

## 2022-09-26 DIAGNOSIS — I739 Peripheral vascular disease, unspecified: Secondary | ICD-10-CM | POA: Diagnosis not present

## 2022-09-26 DIAGNOSIS — B351 Tinea unguium: Secondary | ICD-10-CM | POA: Diagnosis not present

## 2022-09-26 DIAGNOSIS — I10 Essential (primary) hypertension: Secondary | ICD-10-CM | POA: Diagnosis not present

## 2022-09-26 DIAGNOSIS — E7849 Other hyperlipidemia: Secondary | ICD-10-CM | POA: Diagnosis not present

## 2022-09-27 DIAGNOSIS — M6281 Muscle weakness (generalized): Secondary | ICD-10-CM | POA: Diagnosis not present

## 2022-09-27 DIAGNOSIS — G20A1 Parkinson's disease without dyskinesia, without mention of fluctuations: Secondary | ICD-10-CM | POA: Diagnosis not present

## 2022-09-27 DIAGNOSIS — R1312 Dysphagia, oropharyngeal phase: Secondary | ICD-10-CM | POA: Diagnosis not present

## 2022-09-27 DIAGNOSIS — E7849 Other hyperlipidemia: Secondary | ICD-10-CM | POA: Diagnosis not present

## 2022-09-27 DIAGNOSIS — I10 Essential (primary) hypertension: Secondary | ICD-10-CM | POA: Diagnosis not present

## 2022-09-27 DIAGNOSIS — E538 Deficiency of other specified B group vitamins: Secondary | ICD-10-CM | POA: Diagnosis not present

## 2022-09-27 DIAGNOSIS — E43 Unspecified severe protein-calorie malnutrition: Secondary | ICD-10-CM | POA: Diagnosis not present

## 2022-09-28 DIAGNOSIS — E538 Deficiency of other specified B group vitamins: Secondary | ICD-10-CM | POA: Diagnosis not present

## 2022-09-28 DIAGNOSIS — M6281 Muscle weakness (generalized): Secondary | ICD-10-CM | POA: Diagnosis not present

## 2022-09-28 DIAGNOSIS — E7849 Other hyperlipidemia: Secondary | ICD-10-CM | POA: Diagnosis not present

## 2022-09-28 DIAGNOSIS — I10 Essential (primary) hypertension: Secondary | ICD-10-CM | POA: Diagnosis not present

## 2022-09-28 DIAGNOSIS — E43 Unspecified severe protein-calorie malnutrition: Secondary | ICD-10-CM | POA: Diagnosis not present

## 2022-09-28 DIAGNOSIS — G20A1 Parkinson's disease without dyskinesia, without mention of fluctuations: Secondary | ICD-10-CM | POA: Diagnosis not present

## 2022-09-28 DIAGNOSIS — R1312 Dysphagia, oropharyngeal phase: Secondary | ICD-10-CM | POA: Diagnosis not present

## 2022-09-29 DIAGNOSIS — E43 Unspecified severe protein-calorie malnutrition: Secondary | ICD-10-CM | POA: Diagnosis not present

## 2022-09-29 DIAGNOSIS — E538 Deficiency of other specified B group vitamins: Secondary | ICD-10-CM | POA: Diagnosis not present

## 2022-09-29 DIAGNOSIS — R1312 Dysphagia, oropharyngeal phase: Secondary | ICD-10-CM | POA: Diagnosis not present

## 2022-09-29 DIAGNOSIS — I10 Essential (primary) hypertension: Secondary | ICD-10-CM | POA: Diagnosis not present

## 2022-09-29 DIAGNOSIS — E7849 Other hyperlipidemia: Secondary | ICD-10-CM | POA: Diagnosis not present

## 2022-09-29 DIAGNOSIS — G20A1 Parkinson's disease without dyskinesia, without mention of fluctuations: Secondary | ICD-10-CM | POA: Diagnosis not present

## 2022-09-29 DIAGNOSIS — M6281 Muscle weakness (generalized): Secondary | ICD-10-CM | POA: Diagnosis not present

## 2022-09-30 DIAGNOSIS — E538 Deficiency of other specified B group vitamins: Secondary | ICD-10-CM | POA: Diagnosis not present

## 2022-09-30 DIAGNOSIS — G20A1 Parkinson's disease without dyskinesia, without mention of fluctuations: Secondary | ICD-10-CM | POA: Diagnosis not present

## 2022-09-30 DIAGNOSIS — F5101 Primary insomnia: Secondary | ICD-10-CM | POA: Diagnosis not present

## 2022-09-30 DIAGNOSIS — E7849 Other hyperlipidemia: Secondary | ICD-10-CM | POA: Diagnosis not present

## 2022-09-30 DIAGNOSIS — E43 Unspecified severe protein-calorie malnutrition: Secondary | ICD-10-CM | POA: Diagnosis not present

## 2022-09-30 DIAGNOSIS — R1312 Dysphagia, oropharyngeal phase: Secondary | ICD-10-CM | POA: Diagnosis not present

## 2022-09-30 DIAGNOSIS — M6281 Muscle weakness (generalized): Secondary | ICD-10-CM | POA: Diagnosis not present

## 2022-09-30 DIAGNOSIS — E440 Moderate protein-calorie malnutrition: Secondary | ICD-10-CM | POA: Diagnosis not present

## 2022-09-30 DIAGNOSIS — I10 Essential (primary) hypertension: Secondary | ICD-10-CM | POA: Diagnosis not present

## 2022-10-01 DIAGNOSIS — M6281 Muscle weakness (generalized): Secondary | ICD-10-CM | POA: Diagnosis not present

## 2022-10-01 DIAGNOSIS — E538 Deficiency of other specified B group vitamins: Secondary | ICD-10-CM | POA: Diagnosis not present

## 2022-10-01 DIAGNOSIS — R1312 Dysphagia, oropharyngeal phase: Secondary | ICD-10-CM | POA: Diagnosis not present

## 2022-10-01 DIAGNOSIS — I10 Essential (primary) hypertension: Secondary | ICD-10-CM | POA: Diagnosis not present

## 2022-10-01 DIAGNOSIS — E7849 Other hyperlipidemia: Secondary | ICD-10-CM | POA: Diagnosis not present

## 2022-10-01 DIAGNOSIS — E43 Unspecified severe protein-calorie malnutrition: Secondary | ICD-10-CM | POA: Diagnosis not present

## 2022-10-01 DIAGNOSIS — G20A1 Parkinson's disease without dyskinesia, without mention of fluctuations: Secondary | ICD-10-CM | POA: Diagnosis not present

## 2022-10-02 DIAGNOSIS — M6281 Muscle weakness (generalized): Secondary | ICD-10-CM | POA: Diagnosis not present

## 2022-10-02 DIAGNOSIS — E7849 Other hyperlipidemia: Secondary | ICD-10-CM | POA: Diagnosis not present

## 2022-10-02 DIAGNOSIS — G20A1 Parkinson's disease without dyskinesia, without mention of fluctuations: Secondary | ICD-10-CM | POA: Diagnosis not present

## 2022-10-02 DIAGNOSIS — I10 Essential (primary) hypertension: Secondary | ICD-10-CM | POA: Diagnosis not present

## 2022-10-02 DIAGNOSIS — R1312 Dysphagia, oropharyngeal phase: Secondary | ICD-10-CM | POA: Diagnosis not present

## 2022-10-02 DIAGNOSIS — E538 Deficiency of other specified B group vitamins: Secondary | ICD-10-CM | POA: Diagnosis not present

## 2022-10-02 DIAGNOSIS — E43 Unspecified severe protein-calorie malnutrition: Secondary | ICD-10-CM | POA: Diagnosis not present

## 2022-10-03 DIAGNOSIS — R1312 Dysphagia, oropharyngeal phase: Secondary | ICD-10-CM | POA: Diagnosis not present

## 2022-10-03 DIAGNOSIS — E538 Deficiency of other specified B group vitamins: Secondary | ICD-10-CM | POA: Diagnosis not present

## 2022-10-03 DIAGNOSIS — M6281 Muscle weakness (generalized): Secondary | ICD-10-CM | POA: Diagnosis not present

## 2022-10-03 DIAGNOSIS — I10 Essential (primary) hypertension: Secondary | ICD-10-CM | POA: Diagnosis not present

## 2022-10-03 DIAGNOSIS — E43 Unspecified severe protein-calorie malnutrition: Secondary | ICD-10-CM | POA: Diagnosis not present

## 2022-10-03 DIAGNOSIS — E7849 Other hyperlipidemia: Secondary | ICD-10-CM | POA: Diagnosis not present

## 2022-10-03 DIAGNOSIS — G20A1 Parkinson's disease without dyskinesia, without mention of fluctuations: Secondary | ICD-10-CM | POA: Diagnosis not present

## 2022-10-05 DIAGNOSIS — E43 Unspecified severe protein-calorie malnutrition: Secondary | ICD-10-CM | POA: Diagnosis not present

## 2022-10-05 DIAGNOSIS — M6281 Muscle weakness (generalized): Secondary | ICD-10-CM | POA: Diagnosis not present

## 2022-10-05 DIAGNOSIS — R1312 Dysphagia, oropharyngeal phase: Secondary | ICD-10-CM | POA: Diagnosis not present

## 2022-10-05 DIAGNOSIS — G20A1 Parkinson's disease without dyskinesia, without mention of fluctuations: Secondary | ICD-10-CM | POA: Diagnosis not present

## 2022-10-05 DIAGNOSIS — E7849 Other hyperlipidemia: Secondary | ICD-10-CM | POA: Diagnosis not present

## 2022-10-05 DIAGNOSIS — E538 Deficiency of other specified B group vitamins: Secondary | ICD-10-CM | POA: Diagnosis not present

## 2022-10-05 DIAGNOSIS — I10 Essential (primary) hypertension: Secondary | ICD-10-CM | POA: Diagnosis not present

## 2022-10-06 DIAGNOSIS — E538 Deficiency of other specified B group vitamins: Secondary | ICD-10-CM | POA: Diagnosis not present

## 2022-10-06 DIAGNOSIS — E43 Unspecified severe protein-calorie malnutrition: Secondary | ICD-10-CM | POA: Diagnosis not present

## 2022-10-06 DIAGNOSIS — R1312 Dysphagia, oropharyngeal phase: Secondary | ICD-10-CM | POA: Diagnosis not present

## 2022-10-06 DIAGNOSIS — E7849 Other hyperlipidemia: Secondary | ICD-10-CM | POA: Diagnosis not present

## 2022-10-06 DIAGNOSIS — M6281 Muscle weakness (generalized): Secondary | ICD-10-CM | POA: Diagnosis not present

## 2022-10-06 DIAGNOSIS — R296 Repeated falls: Secondary | ICD-10-CM | POA: Diagnosis not present

## 2022-10-06 DIAGNOSIS — E440 Moderate protein-calorie malnutrition: Secondary | ICD-10-CM | POA: Diagnosis not present

## 2022-10-06 DIAGNOSIS — I10 Essential (primary) hypertension: Secondary | ICD-10-CM | POA: Diagnosis not present

## 2022-10-06 DIAGNOSIS — G20A1 Parkinson's disease without dyskinesia, without mention of fluctuations: Secondary | ICD-10-CM | POA: Diagnosis not present

## 2022-10-07 DIAGNOSIS — M6281 Muscle weakness (generalized): Secondary | ICD-10-CM | POA: Diagnosis not present

## 2022-10-07 DIAGNOSIS — G20C Parkinsonism, unspecified: Secondary | ICD-10-CM | POA: Diagnosis not present

## 2022-10-07 DIAGNOSIS — R1312 Dysphagia, oropharyngeal phase: Secondary | ICD-10-CM | POA: Diagnosis not present

## 2022-10-07 DIAGNOSIS — G20A1 Parkinson's disease without dyskinesia, without mention of fluctuations: Secondary | ICD-10-CM | POA: Diagnosis not present

## 2022-10-07 DIAGNOSIS — I10 Essential (primary) hypertension: Secondary | ICD-10-CM | POA: Diagnosis not present

## 2022-10-07 DIAGNOSIS — E7849 Other hyperlipidemia: Secondary | ICD-10-CM | POA: Diagnosis not present

## 2022-10-07 DIAGNOSIS — F5101 Primary insomnia: Secondary | ICD-10-CM | POA: Diagnosis not present

## 2022-10-07 DIAGNOSIS — E538 Deficiency of other specified B group vitamins: Secondary | ICD-10-CM | POA: Diagnosis not present

## 2022-10-07 DIAGNOSIS — E43 Unspecified severe protein-calorie malnutrition: Secondary | ICD-10-CM | POA: Diagnosis not present

## 2022-10-07 DIAGNOSIS — F02B18 Dementia in other diseases classified elsewhere, moderate, with other behavioral disturbance: Secondary | ICD-10-CM | POA: Diagnosis not present

## 2022-10-10 DIAGNOSIS — E43 Unspecified severe protein-calorie malnutrition: Secondary | ICD-10-CM | POA: Diagnosis not present

## 2022-10-10 DIAGNOSIS — M6282 Rhabdomyolysis: Secondary | ICD-10-CM | POA: Diagnosis not present

## 2022-10-10 DIAGNOSIS — I639 Cerebral infarction, unspecified: Secondary | ICD-10-CM | POA: Diagnosis not present

## 2022-10-10 DIAGNOSIS — R269 Unspecified abnormalities of gait and mobility: Secondary | ICD-10-CM | POA: Diagnosis not present

## 2022-10-12 DIAGNOSIS — E43 Unspecified severe protein-calorie malnutrition: Secondary | ICD-10-CM | POA: Diagnosis not present

## 2022-10-12 DIAGNOSIS — R1312 Dysphagia, oropharyngeal phase: Secondary | ICD-10-CM | POA: Diagnosis not present

## 2022-10-12 DIAGNOSIS — M6281 Muscle weakness (generalized): Secondary | ICD-10-CM | POA: Diagnosis not present

## 2022-10-12 DIAGNOSIS — I10 Essential (primary) hypertension: Secondary | ICD-10-CM | POA: Diagnosis not present

## 2022-10-12 DIAGNOSIS — G20A1 Parkinson's disease without dyskinesia, without mention of fluctuations: Secondary | ICD-10-CM | POA: Diagnosis not present

## 2022-10-12 DIAGNOSIS — E7849 Other hyperlipidemia: Secondary | ICD-10-CM | POA: Diagnosis not present

## 2022-10-12 DIAGNOSIS — E538 Deficiency of other specified B group vitamins: Secondary | ICD-10-CM | POA: Diagnosis not present

## 2022-10-13 DIAGNOSIS — I10 Essential (primary) hypertension: Secondary | ICD-10-CM | POA: Diagnosis not present

## 2022-10-13 DIAGNOSIS — R1312 Dysphagia, oropharyngeal phase: Secondary | ICD-10-CM | POA: Diagnosis not present

## 2022-10-13 DIAGNOSIS — E7849 Other hyperlipidemia: Secondary | ICD-10-CM | POA: Diagnosis not present

## 2022-10-13 DIAGNOSIS — G20A1 Parkinson's disease without dyskinesia, without mention of fluctuations: Secondary | ICD-10-CM | POA: Diagnosis not present

## 2022-10-13 DIAGNOSIS — E43 Unspecified severe protein-calorie malnutrition: Secondary | ICD-10-CM | POA: Diagnosis not present

## 2022-10-13 DIAGNOSIS — E538 Deficiency of other specified B group vitamins: Secondary | ICD-10-CM | POA: Diagnosis not present

## 2022-10-13 DIAGNOSIS — M6281 Muscle weakness (generalized): Secondary | ICD-10-CM | POA: Diagnosis not present

## 2022-10-14 DIAGNOSIS — E538 Deficiency of other specified B group vitamins: Secondary | ICD-10-CM | POA: Diagnosis not present

## 2022-10-14 DIAGNOSIS — G20A1 Parkinson's disease without dyskinesia, without mention of fluctuations: Secondary | ICD-10-CM | POA: Diagnosis not present

## 2022-10-14 DIAGNOSIS — E7849 Other hyperlipidemia: Secondary | ICD-10-CM | POA: Diagnosis not present

## 2022-10-14 DIAGNOSIS — M6281 Muscle weakness (generalized): Secondary | ICD-10-CM | POA: Diagnosis not present

## 2022-10-14 DIAGNOSIS — I10 Essential (primary) hypertension: Secondary | ICD-10-CM | POA: Diagnosis not present

## 2022-10-14 DIAGNOSIS — R1312 Dysphagia, oropharyngeal phase: Secondary | ICD-10-CM | POA: Diagnosis not present

## 2022-10-14 DIAGNOSIS — E43 Unspecified severe protein-calorie malnutrition: Secondary | ICD-10-CM | POA: Diagnosis not present

## 2022-10-15 DIAGNOSIS — E538 Deficiency of other specified B group vitamins: Secondary | ICD-10-CM | POA: Diagnosis not present

## 2022-10-15 DIAGNOSIS — R1312 Dysphagia, oropharyngeal phase: Secondary | ICD-10-CM | POA: Diagnosis not present

## 2022-10-15 DIAGNOSIS — G20A1 Parkinson's disease without dyskinesia, without mention of fluctuations: Secondary | ICD-10-CM | POA: Diagnosis not present

## 2022-10-15 DIAGNOSIS — R296 Repeated falls: Secondary | ICD-10-CM | POA: Diagnosis not present

## 2022-10-15 DIAGNOSIS — I10 Essential (primary) hypertension: Secondary | ICD-10-CM | POA: Diagnosis not present

## 2022-10-15 DIAGNOSIS — E7849 Other hyperlipidemia: Secondary | ICD-10-CM | POA: Diagnosis not present

## 2022-10-15 DIAGNOSIS — M6281 Muscle weakness (generalized): Secondary | ICD-10-CM | POA: Diagnosis not present

## 2022-10-15 DIAGNOSIS — E43 Unspecified severe protein-calorie malnutrition: Secondary | ICD-10-CM | POA: Diagnosis not present

## 2022-10-16 DIAGNOSIS — R1312 Dysphagia, oropharyngeal phase: Secondary | ICD-10-CM | POA: Diagnosis not present

## 2022-10-16 DIAGNOSIS — G20A1 Parkinson's disease without dyskinesia, without mention of fluctuations: Secondary | ICD-10-CM | POA: Diagnosis not present

## 2022-10-16 DIAGNOSIS — M6281 Muscle weakness (generalized): Secondary | ICD-10-CM | POA: Diagnosis not present

## 2022-10-16 DIAGNOSIS — I10 Essential (primary) hypertension: Secondary | ICD-10-CM | POA: Diagnosis not present

## 2022-10-16 DIAGNOSIS — E7849 Other hyperlipidemia: Secondary | ICD-10-CM | POA: Diagnosis not present

## 2022-10-16 DIAGNOSIS — E43 Unspecified severe protein-calorie malnutrition: Secondary | ICD-10-CM | POA: Diagnosis not present

## 2022-10-16 DIAGNOSIS — E538 Deficiency of other specified B group vitamins: Secondary | ICD-10-CM | POA: Diagnosis not present

## 2022-10-19 DIAGNOSIS — M6281 Muscle weakness (generalized): Secondary | ICD-10-CM | POA: Diagnosis not present

## 2022-10-19 DIAGNOSIS — G20A1 Parkinson's disease without dyskinesia, without mention of fluctuations: Secondary | ICD-10-CM | POA: Diagnosis not present

## 2022-10-19 DIAGNOSIS — E538 Deficiency of other specified B group vitamins: Secondary | ICD-10-CM | POA: Diagnosis not present

## 2022-10-19 DIAGNOSIS — E43 Unspecified severe protein-calorie malnutrition: Secondary | ICD-10-CM | POA: Diagnosis not present

## 2022-10-19 DIAGNOSIS — E7849 Other hyperlipidemia: Secondary | ICD-10-CM | POA: Diagnosis not present

## 2022-10-19 DIAGNOSIS — I10 Essential (primary) hypertension: Secondary | ICD-10-CM | POA: Diagnosis not present

## 2022-10-19 DIAGNOSIS — R1312 Dysphagia, oropharyngeal phase: Secondary | ICD-10-CM | POA: Diagnosis not present

## 2022-10-21 DIAGNOSIS — E43 Unspecified severe protein-calorie malnutrition: Secondary | ICD-10-CM | POA: Diagnosis not present

## 2022-10-21 DIAGNOSIS — E538 Deficiency of other specified B group vitamins: Secondary | ICD-10-CM | POA: Diagnosis not present

## 2022-10-21 DIAGNOSIS — G20A1 Parkinson's disease without dyskinesia, without mention of fluctuations: Secondary | ICD-10-CM | POA: Diagnosis not present

## 2022-10-21 DIAGNOSIS — R1312 Dysphagia, oropharyngeal phase: Secondary | ICD-10-CM | POA: Diagnosis not present

## 2022-10-21 DIAGNOSIS — I10 Essential (primary) hypertension: Secondary | ICD-10-CM | POA: Diagnosis not present

## 2022-10-21 DIAGNOSIS — E7849 Other hyperlipidemia: Secondary | ICD-10-CM | POA: Diagnosis not present

## 2022-10-21 DIAGNOSIS — M6281 Muscle weakness (generalized): Secondary | ICD-10-CM | POA: Diagnosis not present

## 2022-10-22 DIAGNOSIS — G20A1 Parkinson's disease without dyskinesia, without mention of fluctuations: Secondary | ICD-10-CM | POA: Diagnosis not present

## 2022-10-22 DIAGNOSIS — E43 Unspecified severe protein-calorie malnutrition: Secondary | ICD-10-CM | POA: Diagnosis not present

## 2022-10-22 DIAGNOSIS — I10 Essential (primary) hypertension: Secondary | ICD-10-CM | POA: Diagnosis not present

## 2022-10-22 DIAGNOSIS — E7849 Other hyperlipidemia: Secondary | ICD-10-CM | POA: Diagnosis not present

## 2022-10-22 DIAGNOSIS — E538 Deficiency of other specified B group vitamins: Secondary | ICD-10-CM | POA: Diagnosis not present

## 2022-10-22 DIAGNOSIS — R1312 Dysphagia, oropharyngeal phase: Secondary | ICD-10-CM | POA: Diagnosis not present

## 2022-10-22 DIAGNOSIS — M6281 Muscle weakness (generalized): Secondary | ICD-10-CM | POA: Diagnosis not present

## 2022-10-23 DIAGNOSIS — I10 Essential (primary) hypertension: Secondary | ICD-10-CM | POA: Diagnosis not present

## 2022-10-23 DIAGNOSIS — M25522 Pain in left elbow: Secondary | ICD-10-CM | POA: Diagnosis not present

## 2022-10-23 DIAGNOSIS — K219 Gastro-esophageal reflux disease without esophagitis: Secondary | ICD-10-CM | POA: Diagnosis not present

## 2022-10-23 DIAGNOSIS — G20A1 Parkinson's disease without dyskinesia, without mention of fluctuations: Secondary | ICD-10-CM | POA: Diagnosis not present

## 2022-10-23 DIAGNOSIS — W19XXXA Unspecified fall, initial encounter: Secondary | ICD-10-CM | POA: Diagnosis not present

## 2022-10-23 DIAGNOSIS — R296 Repeated falls: Secondary | ICD-10-CM | POA: Diagnosis not present

## 2022-10-24 DIAGNOSIS — I10 Essential (primary) hypertension: Secondary | ICD-10-CM | POA: Diagnosis not present

## 2022-10-24 DIAGNOSIS — E7849 Other hyperlipidemia: Secondary | ICD-10-CM | POA: Diagnosis not present

## 2022-10-24 DIAGNOSIS — M6281 Muscle weakness (generalized): Secondary | ICD-10-CM | POA: Diagnosis not present

## 2022-10-24 DIAGNOSIS — G20A1 Parkinson's disease without dyskinesia, without mention of fluctuations: Secondary | ICD-10-CM | POA: Diagnosis not present

## 2022-10-24 DIAGNOSIS — R1312 Dysphagia, oropharyngeal phase: Secondary | ICD-10-CM | POA: Diagnosis not present

## 2022-10-24 DIAGNOSIS — E538 Deficiency of other specified B group vitamins: Secondary | ICD-10-CM | POA: Diagnosis not present

## 2022-10-24 DIAGNOSIS — E43 Unspecified severe protein-calorie malnutrition: Secondary | ICD-10-CM | POA: Diagnosis not present

## 2022-10-26 DIAGNOSIS — I10 Essential (primary) hypertension: Secondary | ICD-10-CM | POA: Diagnosis not present

## 2022-10-26 DIAGNOSIS — E43 Unspecified severe protein-calorie malnutrition: Secondary | ICD-10-CM | POA: Diagnosis not present

## 2022-10-26 DIAGNOSIS — G20A1 Parkinson's disease without dyskinesia, without mention of fluctuations: Secondary | ICD-10-CM | POA: Diagnosis not present

## 2022-10-26 DIAGNOSIS — E7849 Other hyperlipidemia: Secondary | ICD-10-CM | POA: Diagnosis not present

## 2022-10-26 DIAGNOSIS — E538 Deficiency of other specified B group vitamins: Secondary | ICD-10-CM | POA: Diagnosis not present

## 2022-10-26 DIAGNOSIS — R1312 Dysphagia, oropharyngeal phase: Secondary | ICD-10-CM | POA: Diagnosis not present

## 2022-10-26 DIAGNOSIS — M6281 Muscle weakness (generalized): Secondary | ICD-10-CM | POA: Diagnosis not present

## 2022-10-29 DIAGNOSIS — M6281 Muscle weakness (generalized): Secondary | ICD-10-CM | POA: Diagnosis not present

## 2022-10-29 DIAGNOSIS — I10 Essential (primary) hypertension: Secondary | ICD-10-CM | POA: Diagnosis not present

## 2022-10-29 DIAGNOSIS — E538 Deficiency of other specified B group vitamins: Secondary | ICD-10-CM | POA: Diagnosis not present

## 2022-10-29 DIAGNOSIS — G20A1 Parkinson's disease without dyskinesia, without mention of fluctuations: Secondary | ICD-10-CM | POA: Diagnosis not present

## 2022-10-29 DIAGNOSIS — E43 Unspecified severe protein-calorie malnutrition: Secondary | ICD-10-CM | POA: Diagnosis not present

## 2022-10-29 DIAGNOSIS — E7849 Other hyperlipidemia: Secondary | ICD-10-CM | POA: Diagnosis not present

## 2022-10-29 DIAGNOSIS — R1312 Dysphagia, oropharyngeal phase: Secondary | ICD-10-CM | POA: Diagnosis not present

## 2022-10-31 DIAGNOSIS — R1312 Dysphagia, oropharyngeal phase: Secondary | ICD-10-CM | POA: Diagnosis not present

## 2022-10-31 DIAGNOSIS — E7849 Other hyperlipidemia: Secondary | ICD-10-CM | POA: Diagnosis not present

## 2022-10-31 DIAGNOSIS — G20A1 Parkinson's disease without dyskinesia, without mention of fluctuations: Secondary | ICD-10-CM | POA: Diagnosis not present

## 2022-10-31 DIAGNOSIS — E43 Unspecified severe protein-calorie malnutrition: Secondary | ICD-10-CM | POA: Diagnosis not present

## 2022-10-31 DIAGNOSIS — M6281 Muscle weakness (generalized): Secondary | ICD-10-CM | POA: Diagnosis not present

## 2022-10-31 DIAGNOSIS — I10 Essential (primary) hypertension: Secondary | ICD-10-CM | POA: Diagnosis not present

## 2022-10-31 DIAGNOSIS — E538 Deficiency of other specified B group vitamins: Secondary | ICD-10-CM | POA: Diagnosis not present

## 2022-11-02 DIAGNOSIS — R296 Repeated falls: Secondary | ICD-10-CM | POA: Diagnosis not present

## 2022-11-02 DIAGNOSIS — G20A1 Parkinson's disease without dyskinesia, without mention of fluctuations: Secondary | ICD-10-CM | POA: Diagnosis not present

## 2022-11-02 DIAGNOSIS — E440 Moderate protein-calorie malnutrition: Secondary | ICD-10-CM | POA: Diagnosis not present

## 2022-11-02 DIAGNOSIS — K5909 Other constipation: Secondary | ICD-10-CM | POA: Diagnosis not present

## 2022-11-02 DIAGNOSIS — I10 Essential (primary) hypertension: Secondary | ICD-10-CM | POA: Diagnosis not present

## 2022-11-02 DIAGNOSIS — K219 Gastro-esophageal reflux disease without esophagitis: Secondary | ICD-10-CM | POA: Diagnosis not present

## 2022-11-03 DIAGNOSIS — G20A1 Parkinson's disease without dyskinesia, without mention of fluctuations: Secondary | ICD-10-CM | POA: Diagnosis not present

## 2022-11-03 DIAGNOSIS — R1312 Dysphagia, oropharyngeal phase: Secondary | ICD-10-CM | POA: Diagnosis not present

## 2022-11-03 DIAGNOSIS — E7849 Other hyperlipidemia: Secondary | ICD-10-CM | POA: Diagnosis not present

## 2022-11-03 DIAGNOSIS — E43 Unspecified severe protein-calorie malnutrition: Secondary | ICD-10-CM | POA: Diagnosis not present

## 2022-11-03 DIAGNOSIS — I10 Essential (primary) hypertension: Secondary | ICD-10-CM | POA: Diagnosis not present

## 2022-11-03 DIAGNOSIS — E538 Deficiency of other specified B group vitamins: Secondary | ICD-10-CM | POA: Diagnosis not present

## 2022-11-03 DIAGNOSIS — M6281 Muscle weakness (generalized): Secondary | ICD-10-CM | POA: Diagnosis not present

## 2022-11-05 DIAGNOSIS — G20A1 Parkinson's disease without dyskinesia, without mention of fluctuations: Secondary | ICD-10-CM | POA: Diagnosis not present

## 2022-11-05 DIAGNOSIS — R451 Restlessness and agitation: Secondary | ICD-10-CM | POA: Diagnosis not present

## 2022-11-05 DIAGNOSIS — R1312 Dysphagia, oropharyngeal phase: Secondary | ICD-10-CM | POA: Diagnosis not present

## 2022-11-05 DIAGNOSIS — E43 Unspecified severe protein-calorie malnutrition: Secondary | ICD-10-CM | POA: Diagnosis not present

## 2022-11-05 DIAGNOSIS — M6281 Muscle weakness (generalized): Secondary | ICD-10-CM | POA: Diagnosis not present

## 2022-11-05 DIAGNOSIS — E440 Moderate protein-calorie malnutrition: Secondary | ICD-10-CM | POA: Diagnosis not present

## 2022-11-05 DIAGNOSIS — E7849 Other hyperlipidemia: Secondary | ICD-10-CM | POA: Diagnosis not present

## 2022-11-05 DIAGNOSIS — I10 Essential (primary) hypertension: Secondary | ICD-10-CM | POA: Diagnosis not present

## 2022-11-05 DIAGNOSIS — E538 Deficiency of other specified B group vitamins: Secondary | ICD-10-CM | POA: Diagnosis not present

## 2022-11-05 DIAGNOSIS — K219 Gastro-esophageal reflux disease without esophagitis: Secondary | ICD-10-CM | POA: Diagnosis not present

## 2022-11-06 DIAGNOSIS — I10 Essential (primary) hypertension: Secondary | ICD-10-CM | POA: Diagnosis not present

## 2022-11-06 DIAGNOSIS — E559 Vitamin D deficiency, unspecified: Secondary | ICD-10-CM | POA: Diagnosis not present

## 2022-11-06 DIAGNOSIS — Z79899 Other long term (current) drug therapy: Secondary | ICD-10-CM | POA: Diagnosis not present

## 2022-11-07 DIAGNOSIS — M6281 Muscle weakness (generalized): Secondary | ICD-10-CM | POA: Diagnosis not present

## 2022-11-07 DIAGNOSIS — I639 Cerebral infarction, unspecified: Secondary | ICD-10-CM | POA: Diagnosis not present

## 2022-11-07 DIAGNOSIS — E538 Deficiency of other specified B group vitamins: Secondary | ICD-10-CM | POA: Diagnosis not present

## 2022-11-07 DIAGNOSIS — R1312 Dysphagia, oropharyngeal phase: Secondary | ICD-10-CM | POA: Diagnosis not present

## 2022-11-07 DIAGNOSIS — E43 Unspecified severe protein-calorie malnutrition: Secondary | ICD-10-CM | POA: Diagnosis not present

## 2022-11-07 DIAGNOSIS — M6282 Rhabdomyolysis: Secondary | ICD-10-CM | POA: Diagnosis not present

## 2022-11-07 DIAGNOSIS — E7849 Other hyperlipidemia: Secondary | ICD-10-CM | POA: Diagnosis not present

## 2022-11-07 DIAGNOSIS — R269 Unspecified abnormalities of gait and mobility: Secondary | ICD-10-CM | POA: Diagnosis not present

## 2022-11-07 DIAGNOSIS — I10 Essential (primary) hypertension: Secondary | ICD-10-CM | POA: Diagnosis not present

## 2022-11-07 DIAGNOSIS — G20A1 Parkinson's disease without dyskinesia, without mention of fluctuations: Secondary | ICD-10-CM | POA: Diagnosis not present

## 2022-11-10 DIAGNOSIS — E538 Deficiency of other specified B group vitamins: Secondary | ICD-10-CM | POA: Diagnosis not present

## 2022-11-10 DIAGNOSIS — M6281 Muscle weakness (generalized): Secondary | ICD-10-CM | POA: Diagnosis not present

## 2022-11-10 DIAGNOSIS — E7849 Other hyperlipidemia: Secondary | ICD-10-CM | POA: Diagnosis not present

## 2022-11-10 DIAGNOSIS — E43 Unspecified severe protein-calorie malnutrition: Secondary | ICD-10-CM | POA: Diagnosis not present

## 2022-11-10 DIAGNOSIS — R1312 Dysphagia, oropharyngeal phase: Secondary | ICD-10-CM | POA: Diagnosis not present

## 2022-11-10 DIAGNOSIS — G20A1 Parkinson's disease without dyskinesia, without mention of fluctuations: Secondary | ICD-10-CM | POA: Diagnosis not present

## 2022-11-10 DIAGNOSIS — I10 Essential (primary) hypertension: Secondary | ICD-10-CM | POA: Diagnosis not present

## 2022-11-12 DIAGNOSIS — E43 Unspecified severe protein-calorie malnutrition: Secondary | ICD-10-CM | POA: Diagnosis not present

## 2022-11-12 DIAGNOSIS — E538 Deficiency of other specified B group vitamins: Secondary | ICD-10-CM | POA: Diagnosis not present

## 2022-11-12 DIAGNOSIS — M6281 Muscle weakness (generalized): Secondary | ICD-10-CM | POA: Diagnosis not present

## 2022-11-12 DIAGNOSIS — R1312 Dysphagia, oropharyngeal phase: Secondary | ICD-10-CM | POA: Diagnosis not present

## 2022-11-12 DIAGNOSIS — G20A1 Parkinson's disease without dyskinesia, without mention of fluctuations: Secondary | ICD-10-CM | POA: Diagnosis not present

## 2022-11-12 DIAGNOSIS — I10 Essential (primary) hypertension: Secondary | ICD-10-CM | POA: Diagnosis not present

## 2022-11-12 DIAGNOSIS — E7849 Other hyperlipidemia: Secondary | ICD-10-CM | POA: Diagnosis not present

## 2022-11-15 DIAGNOSIS — I10 Essential (primary) hypertension: Secondary | ICD-10-CM | POA: Diagnosis not present

## 2022-11-15 DIAGNOSIS — E43 Unspecified severe protein-calorie malnutrition: Secondary | ICD-10-CM | POA: Diagnosis not present

## 2022-11-15 DIAGNOSIS — K5909 Other constipation: Secondary | ICD-10-CM | POA: Diagnosis not present

## 2022-11-15 DIAGNOSIS — R296 Repeated falls: Secondary | ICD-10-CM | POA: Diagnosis not present

## 2022-11-15 DIAGNOSIS — K219 Gastro-esophageal reflux disease without esophagitis: Secondary | ICD-10-CM | POA: Diagnosis not present

## 2022-11-15 DIAGNOSIS — G20A1 Parkinson's disease without dyskinesia, without mention of fluctuations: Secondary | ICD-10-CM | POA: Diagnosis not present

## 2022-11-17 DIAGNOSIS — R1312 Dysphagia, oropharyngeal phase: Secondary | ICD-10-CM | POA: Diagnosis not present

## 2022-11-17 DIAGNOSIS — E43 Unspecified severe protein-calorie malnutrition: Secondary | ICD-10-CM | POA: Diagnosis not present

## 2022-11-17 DIAGNOSIS — E538 Deficiency of other specified B group vitamins: Secondary | ICD-10-CM | POA: Diagnosis not present

## 2022-11-17 DIAGNOSIS — M6281 Muscle weakness (generalized): Secondary | ICD-10-CM | POA: Diagnosis not present

## 2022-11-17 DIAGNOSIS — G20A1 Parkinson's disease without dyskinesia, without mention of fluctuations: Secondary | ICD-10-CM | POA: Diagnosis not present

## 2022-11-17 DIAGNOSIS — I10 Essential (primary) hypertension: Secondary | ICD-10-CM | POA: Diagnosis not present

## 2022-11-17 DIAGNOSIS — E7849 Other hyperlipidemia: Secondary | ICD-10-CM | POA: Diagnosis not present

## 2022-11-18 DIAGNOSIS — F02B18 Dementia in other diseases classified elsewhere, moderate, with other behavioral disturbance: Secondary | ICD-10-CM | POA: Diagnosis not present

## 2022-11-18 DIAGNOSIS — E538 Deficiency of other specified B group vitamins: Secondary | ICD-10-CM | POA: Diagnosis not present

## 2022-11-18 DIAGNOSIS — E7849 Other hyperlipidemia: Secondary | ICD-10-CM | POA: Diagnosis not present

## 2022-11-18 DIAGNOSIS — M6281 Muscle weakness (generalized): Secondary | ICD-10-CM | POA: Diagnosis not present

## 2022-11-18 DIAGNOSIS — K219 Gastro-esophageal reflux disease without esophagitis: Secondary | ICD-10-CM | POA: Diagnosis not present

## 2022-11-18 DIAGNOSIS — G20C Parkinsonism, unspecified: Secondary | ICD-10-CM | POA: Diagnosis not present

## 2022-11-18 DIAGNOSIS — E43 Unspecified severe protein-calorie malnutrition: Secondary | ICD-10-CM | POA: Diagnosis not present

## 2022-11-18 DIAGNOSIS — F5101 Primary insomnia: Secondary | ICD-10-CM | POA: Diagnosis not present

## 2022-11-18 DIAGNOSIS — G20A1 Parkinson's disease without dyskinesia, without mention of fluctuations: Secondary | ICD-10-CM | POA: Diagnosis not present

## 2022-11-18 DIAGNOSIS — I10 Essential (primary) hypertension: Secondary | ICD-10-CM | POA: Diagnosis not present

## 2022-11-18 DIAGNOSIS — R1312 Dysphagia, oropharyngeal phase: Secondary | ICD-10-CM | POA: Diagnosis not present

## 2022-11-19 DIAGNOSIS — E538 Deficiency of other specified B group vitamins: Secondary | ICD-10-CM | POA: Diagnosis not present

## 2022-11-19 DIAGNOSIS — G20A1 Parkinson's disease without dyskinesia, without mention of fluctuations: Secondary | ICD-10-CM | POA: Diagnosis not present

## 2022-11-19 DIAGNOSIS — M6281 Muscle weakness (generalized): Secondary | ICD-10-CM | POA: Diagnosis not present

## 2022-11-19 DIAGNOSIS — I10 Essential (primary) hypertension: Secondary | ICD-10-CM | POA: Diagnosis not present

## 2022-11-19 DIAGNOSIS — R1312 Dysphagia, oropharyngeal phase: Secondary | ICD-10-CM | POA: Diagnosis not present

## 2022-11-19 DIAGNOSIS — E43 Unspecified severe protein-calorie malnutrition: Secondary | ICD-10-CM | POA: Diagnosis not present

## 2022-11-19 DIAGNOSIS — E7849 Other hyperlipidemia: Secondary | ICD-10-CM | POA: Diagnosis not present

## 2022-11-20 DIAGNOSIS — G20A1 Parkinson's disease without dyskinesia, without mention of fluctuations: Secondary | ICD-10-CM | POA: Diagnosis not present

## 2022-11-20 DIAGNOSIS — M6281 Muscle weakness (generalized): Secondary | ICD-10-CM | POA: Diagnosis not present

## 2022-11-20 DIAGNOSIS — E43 Unspecified severe protein-calorie malnutrition: Secondary | ICD-10-CM | POA: Diagnosis not present

## 2022-11-20 DIAGNOSIS — I10 Essential (primary) hypertension: Secondary | ICD-10-CM | POA: Diagnosis not present

## 2022-11-20 DIAGNOSIS — E538 Deficiency of other specified B group vitamins: Secondary | ICD-10-CM | POA: Diagnosis not present

## 2022-11-20 DIAGNOSIS — E7849 Other hyperlipidemia: Secondary | ICD-10-CM | POA: Diagnosis not present

## 2022-11-20 DIAGNOSIS — R1312 Dysphagia, oropharyngeal phase: Secondary | ICD-10-CM | POA: Diagnosis not present

## 2022-11-21 DIAGNOSIS — E7849 Other hyperlipidemia: Secondary | ICD-10-CM | POA: Diagnosis not present

## 2022-11-21 DIAGNOSIS — I10 Essential (primary) hypertension: Secondary | ICD-10-CM | POA: Diagnosis not present

## 2022-11-21 DIAGNOSIS — G20A1 Parkinson's disease without dyskinesia, without mention of fluctuations: Secondary | ICD-10-CM | POA: Diagnosis not present

## 2022-11-21 DIAGNOSIS — E538 Deficiency of other specified B group vitamins: Secondary | ICD-10-CM | POA: Diagnosis not present

## 2022-11-21 DIAGNOSIS — E43 Unspecified severe protein-calorie malnutrition: Secondary | ICD-10-CM | POA: Diagnosis not present

## 2022-11-21 DIAGNOSIS — R1312 Dysphagia, oropharyngeal phase: Secondary | ICD-10-CM | POA: Diagnosis not present

## 2022-11-21 DIAGNOSIS — M6281 Muscle weakness (generalized): Secondary | ICD-10-CM | POA: Diagnosis not present

## 2022-11-26 DIAGNOSIS — G20A1 Parkinson's disease without dyskinesia, without mention of fluctuations: Secondary | ICD-10-CM | POA: Diagnosis not present

## 2022-11-26 DIAGNOSIS — E440 Moderate protein-calorie malnutrition: Secondary | ICD-10-CM | POA: Diagnosis not present

## 2022-11-26 DIAGNOSIS — I10 Essential (primary) hypertension: Secondary | ICD-10-CM | POA: Diagnosis not present

## 2022-11-30 DIAGNOSIS — R296 Repeated falls: Secondary | ICD-10-CM | POA: Diagnosis not present

## 2022-11-30 DIAGNOSIS — E440 Moderate protein-calorie malnutrition: Secondary | ICD-10-CM | POA: Diagnosis not present

## 2022-11-30 DIAGNOSIS — I10 Essential (primary) hypertension: Secondary | ICD-10-CM | POA: Diagnosis not present

## 2022-11-30 DIAGNOSIS — G20A1 Parkinson's disease without dyskinesia, without mention of fluctuations: Secondary | ICD-10-CM | POA: Diagnosis not present

## 2022-11-30 DIAGNOSIS — K219 Gastro-esophageal reflux disease without esophagitis: Secondary | ICD-10-CM | POA: Diagnosis not present

## 2022-11-30 DIAGNOSIS — K5909 Other constipation: Secondary | ICD-10-CM | POA: Diagnosis not present

## 2022-12-02 DIAGNOSIS — K219 Gastro-esophageal reflux disease without esophagitis: Secondary | ICD-10-CM | POA: Diagnosis not present

## 2022-12-02 DIAGNOSIS — I10 Essential (primary) hypertension: Secondary | ICD-10-CM | POA: Diagnosis not present

## 2022-12-02 DIAGNOSIS — R296 Repeated falls: Secondary | ICD-10-CM | POA: Diagnosis not present

## 2022-12-02 DIAGNOSIS — G20A1 Parkinson's disease without dyskinesia, without mention of fluctuations: Secondary | ICD-10-CM | POA: Diagnosis not present

## 2022-12-08 DIAGNOSIS — E46 Unspecified protein-calorie malnutrition: Secondary | ICD-10-CM | POA: Diagnosis not present

## 2022-12-08 DIAGNOSIS — I739 Peripheral vascular disease, unspecified: Secondary | ICD-10-CM | POA: Diagnosis not present

## 2022-12-08 DIAGNOSIS — I10 Essential (primary) hypertension: Secondary | ICD-10-CM | POA: Diagnosis not present

## 2022-12-09 DIAGNOSIS — G20C Parkinsonism, unspecified: Secondary | ICD-10-CM | POA: Diagnosis not present

## 2022-12-09 DIAGNOSIS — G20A1 Parkinson's disease without dyskinesia, without mention of fluctuations: Secondary | ICD-10-CM | POA: Diagnosis not present

## 2022-12-09 DIAGNOSIS — K219 Gastro-esophageal reflux disease without esophagitis: Secondary | ICD-10-CM | POA: Diagnosis not present

## 2022-12-09 DIAGNOSIS — I10 Essential (primary) hypertension: Secondary | ICD-10-CM | POA: Diagnosis not present

## 2022-12-09 DIAGNOSIS — E440 Moderate protein-calorie malnutrition: Secondary | ICD-10-CM | POA: Diagnosis not present

## 2022-12-09 DIAGNOSIS — F02B18 Dementia in other diseases classified elsewhere, moderate, with other behavioral disturbance: Secondary | ICD-10-CM | POA: Diagnosis not present

## 2022-12-09 DIAGNOSIS — F5101 Primary insomnia: Secondary | ICD-10-CM | POA: Diagnosis not present

## 2022-12-18 DIAGNOSIS — K219 Gastro-esophageal reflux disease without esophagitis: Secondary | ICD-10-CM | POA: Diagnosis not present

## 2022-12-18 DIAGNOSIS — I10 Essential (primary) hypertension: Secondary | ICD-10-CM | POA: Diagnosis not present

## 2022-12-18 DIAGNOSIS — G20A1 Parkinson's disease without dyskinesia, without mention of fluctuations: Secondary | ICD-10-CM | POA: Diagnosis not present

## 2022-12-19 DIAGNOSIS — I639 Cerebral infarction, unspecified: Secondary | ICD-10-CM | POA: Diagnosis not present

## 2022-12-19 DIAGNOSIS — E43 Unspecified severe protein-calorie malnutrition: Secondary | ICD-10-CM | POA: Diagnosis not present

## 2022-12-19 DIAGNOSIS — M6282 Rhabdomyolysis: Secondary | ICD-10-CM | POA: Diagnosis not present

## 2022-12-19 DIAGNOSIS — R269 Unspecified abnormalities of gait and mobility: Secondary | ICD-10-CM | POA: Diagnosis not present

## 2022-12-21 DIAGNOSIS — G20A1 Parkinson's disease without dyskinesia, without mention of fluctuations: Secondary | ICD-10-CM | POA: Diagnosis not present

## 2022-12-21 DIAGNOSIS — I10 Essential (primary) hypertension: Secondary | ICD-10-CM | POA: Diagnosis not present

## 2022-12-21 DIAGNOSIS — K5909 Other constipation: Secondary | ICD-10-CM | POA: Diagnosis not present

## 2022-12-21 DIAGNOSIS — E440 Moderate protein-calorie malnutrition: Secondary | ICD-10-CM | POA: Diagnosis not present

## 2022-12-21 DIAGNOSIS — K219 Gastro-esophageal reflux disease without esophagitis: Secondary | ICD-10-CM | POA: Diagnosis not present

## 2022-12-21 DIAGNOSIS — R296 Repeated falls: Secondary | ICD-10-CM | POA: Diagnosis not present

## 2022-12-25 DIAGNOSIS — R451 Restlessness and agitation: Secondary | ICD-10-CM | POA: Diagnosis not present

## 2022-12-25 DIAGNOSIS — G20A1 Parkinson's disease without dyskinesia, without mention of fluctuations: Secondary | ICD-10-CM | POA: Diagnosis not present

## 2022-12-25 DIAGNOSIS — I10 Essential (primary) hypertension: Secondary | ICD-10-CM | POA: Diagnosis not present

## 2022-12-28 DIAGNOSIS — K5909 Other constipation: Secondary | ICD-10-CM | POA: Diagnosis not present

## 2022-12-28 DIAGNOSIS — E440 Moderate protein-calorie malnutrition: Secondary | ICD-10-CM | POA: Diagnosis not present

## 2022-12-28 DIAGNOSIS — G20A1 Parkinson's disease without dyskinesia, without mention of fluctuations: Secondary | ICD-10-CM | POA: Diagnosis not present

## 2022-12-28 DIAGNOSIS — K219 Gastro-esophageal reflux disease without esophagitis: Secondary | ICD-10-CM | POA: Diagnosis not present

## 2022-12-28 DIAGNOSIS — R296 Repeated falls: Secondary | ICD-10-CM | POA: Diagnosis not present

## 2023-01-06 DIAGNOSIS — F5101 Primary insomnia: Secondary | ICD-10-CM | POA: Diagnosis not present

## 2023-01-06 DIAGNOSIS — G20C Parkinsonism, unspecified: Secondary | ICD-10-CM | POA: Diagnosis not present

## 2023-01-07 DIAGNOSIS — E440 Moderate protein-calorie malnutrition: Secondary | ICD-10-CM | POA: Diagnosis not present

## 2023-01-07 DIAGNOSIS — I10 Essential (primary) hypertension: Secondary | ICD-10-CM | POA: Diagnosis not present

## 2023-01-07 DIAGNOSIS — G20A1 Parkinson's disease without dyskinesia, without mention of fluctuations: Secondary | ICD-10-CM | POA: Diagnosis not present

## 2023-01-07 DIAGNOSIS — K219 Gastro-esophageal reflux disease without esophagitis: Secondary | ICD-10-CM | POA: Diagnosis not present

## 2023-01-12 DIAGNOSIS — K219 Gastro-esophageal reflux disease without esophagitis: Secondary | ICD-10-CM | POA: Diagnosis not present

## 2023-01-12 DIAGNOSIS — I10 Essential (primary) hypertension: Secondary | ICD-10-CM | POA: Diagnosis not present

## 2023-01-12 DIAGNOSIS — E440 Moderate protein-calorie malnutrition: Secondary | ICD-10-CM | POA: Diagnosis not present

## 2023-01-12 DIAGNOSIS — R451 Restlessness and agitation: Secondary | ICD-10-CM | POA: Diagnosis not present

## 2023-01-16 DIAGNOSIS — R269 Unspecified abnormalities of gait and mobility: Secondary | ICD-10-CM | POA: Diagnosis not present

## 2023-01-16 DIAGNOSIS — I10 Essential (primary) hypertension: Secondary | ICD-10-CM | POA: Diagnosis not present

## 2023-01-16 DIAGNOSIS — E46 Unspecified protein-calorie malnutrition: Secondary | ICD-10-CM | POA: Diagnosis not present

## 2023-01-16 DIAGNOSIS — E43 Unspecified severe protein-calorie malnutrition: Secondary | ICD-10-CM | POA: Diagnosis not present

## 2023-01-16 DIAGNOSIS — M6282 Rhabdomyolysis: Secondary | ICD-10-CM | POA: Diagnosis not present

## 2023-01-16 DIAGNOSIS — I639 Cerebral infarction, unspecified: Secondary | ICD-10-CM | POA: Diagnosis not present

## 2023-01-18 DIAGNOSIS — K5909 Other constipation: Secondary | ICD-10-CM | POA: Diagnosis not present

## 2023-01-18 DIAGNOSIS — E440 Moderate protein-calorie malnutrition: Secondary | ICD-10-CM | POA: Diagnosis not present

## 2023-01-18 DIAGNOSIS — K219 Gastro-esophageal reflux disease without esophagitis: Secondary | ICD-10-CM | POA: Diagnosis not present

## 2023-01-18 DIAGNOSIS — G20A1 Parkinson's disease without dyskinesia, without mention of fluctuations: Secondary | ICD-10-CM | POA: Diagnosis not present

## 2023-01-18 DIAGNOSIS — R296 Repeated falls: Secondary | ICD-10-CM | POA: Diagnosis not present

## 2023-01-18 DIAGNOSIS — I10 Essential (primary) hypertension: Secondary | ICD-10-CM | POA: Diagnosis not present

## 2023-01-20 DIAGNOSIS — I10 Essential (primary) hypertension: Secondary | ICD-10-CM | POA: Diagnosis not present

## 2023-01-20 DIAGNOSIS — E538 Deficiency of other specified B group vitamins: Secondary | ICD-10-CM | POA: Diagnosis not present

## 2023-01-20 DIAGNOSIS — G20A1 Parkinson's disease without dyskinesia, without mention of fluctuations: Secondary | ICD-10-CM | POA: Diagnosis not present

## 2023-01-20 DIAGNOSIS — E7849 Other hyperlipidemia: Secondary | ICD-10-CM | POA: Diagnosis not present

## 2023-01-20 DIAGNOSIS — M6281 Muscle weakness (generalized): Secondary | ICD-10-CM | POA: Diagnosis not present

## 2023-01-20 DIAGNOSIS — R1312 Dysphagia, oropharyngeal phase: Secondary | ICD-10-CM | POA: Diagnosis not present

## 2023-01-20 DIAGNOSIS — R2681 Unsteadiness on feet: Secondary | ICD-10-CM | POA: Diagnosis not present

## 2023-01-20 DIAGNOSIS — E43 Unspecified severe protein-calorie malnutrition: Secondary | ICD-10-CM | POA: Diagnosis not present

## 2023-01-21 DIAGNOSIS — G20A1 Parkinson's disease without dyskinesia, without mention of fluctuations: Secondary | ICD-10-CM | POA: Diagnosis not present

## 2023-01-21 DIAGNOSIS — R2681 Unsteadiness on feet: Secondary | ICD-10-CM | POA: Diagnosis not present

## 2023-01-21 DIAGNOSIS — E43 Unspecified severe protein-calorie malnutrition: Secondary | ICD-10-CM | POA: Diagnosis not present

## 2023-01-21 DIAGNOSIS — I10 Essential (primary) hypertension: Secondary | ICD-10-CM | POA: Diagnosis not present

## 2023-01-21 DIAGNOSIS — E538 Deficiency of other specified B group vitamins: Secondary | ICD-10-CM | POA: Diagnosis not present

## 2023-01-21 DIAGNOSIS — E7849 Other hyperlipidemia: Secondary | ICD-10-CM | POA: Diagnosis not present

## 2023-01-21 DIAGNOSIS — M6281 Muscle weakness (generalized): Secondary | ICD-10-CM | POA: Diagnosis not present

## 2023-01-21 DIAGNOSIS — R1312 Dysphagia, oropharyngeal phase: Secondary | ICD-10-CM | POA: Diagnosis not present

## 2023-01-23 DIAGNOSIS — E43 Unspecified severe protein-calorie malnutrition: Secondary | ICD-10-CM | POA: Diagnosis not present

## 2023-01-23 DIAGNOSIS — E538 Deficiency of other specified B group vitamins: Secondary | ICD-10-CM | POA: Diagnosis not present

## 2023-01-23 DIAGNOSIS — R1312 Dysphagia, oropharyngeal phase: Secondary | ICD-10-CM | POA: Diagnosis not present

## 2023-01-23 DIAGNOSIS — E7849 Other hyperlipidemia: Secondary | ICD-10-CM | POA: Diagnosis not present

## 2023-01-23 DIAGNOSIS — R2681 Unsteadiness on feet: Secondary | ICD-10-CM | POA: Diagnosis not present

## 2023-01-23 DIAGNOSIS — I10 Essential (primary) hypertension: Secondary | ICD-10-CM | POA: Diagnosis not present

## 2023-01-23 DIAGNOSIS — M6281 Muscle weakness (generalized): Secondary | ICD-10-CM | POA: Diagnosis not present

## 2023-01-23 DIAGNOSIS — G20A1 Parkinson's disease without dyskinesia, without mention of fluctuations: Secondary | ICD-10-CM | POA: Diagnosis not present

## 2023-01-26 DIAGNOSIS — I739 Peripheral vascular disease, unspecified: Secondary | ICD-10-CM | POA: Diagnosis not present

## 2023-01-26 DIAGNOSIS — E538 Deficiency of other specified B group vitamins: Secondary | ICD-10-CM | POA: Diagnosis not present

## 2023-01-26 DIAGNOSIS — M2042 Other hammer toe(s) (acquired), left foot: Secondary | ICD-10-CM | POA: Diagnosis not present

## 2023-01-26 DIAGNOSIS — L603 Nail dystrophy: Secondary | ICD-10-CM | POA: Diagnosis not present

## 2023-01-26 DIAGNOSIS — E7849 Other hyperlipidemia: Secondary | ICD-10-CM | POA: Diagnosis not present

## 2023-01-26 DIAGNOSIS — M2041 Other hammer toe(s) (acquired), right foot: Secondary | ICD-10-CM | POA: Diagnosis not present

## 2023-01-26 DIAGNOSIS — M6281 Muscle weakness (generalized): Secondary | ICD-10-CM | POA: Diagnosis not present

## 2023-01-26 DIAGNOSIS — R1312 Dysphagia, oropharyngeal phase: Secondary | ICD-10-CM | POA: Diagnosis not present

## 2023-01-26 DIAGNOSIS — E43 Unspecified severe protein-calorie malnutrition: Secondary | ICD-10-CM | POA: Diagnosis not present

## 2023-01-26 DIAGNOSIS — M25562 Pain in left knee: Secondary | ICD-10-CM | POA: Diagnosis not present

## 2023-01-26 DIAGNOSIS — W19XXXA Unspecified fall, initial encounter: Secondary | ICD-10-CM | POA: Diagnosis not present

## 2023-01-26 DIAGNOSIS — M25561 Pain in right knee: Secondary | ICD-10-CM | POA: Diagnosis not present

## 2023-01-26 DIAGNOSIS — R2681 Unsteadiness on feet: Secondary | ICD-10-CM | POA: Diagnosis not present

## 2023-01-26 DIAGNOSIS — I10 Essential (primary) hypertension: Secondary | ICD-10-CM | POA: Diagnosis not present

## 2023-01-26 DIAGNOSIS — B351 Tinea unguium: Secondary | ICD-10-CM | POA: Diagnosis not present

## 2023-01-26 DIAGNOSIS — G20A1 Parkinson's disease without dyskinesia, without mention of fluctuations: Secondary | ICD-10-CM | POA: Diagnosis not present

## 2023-01-27 DIAGNOSIS — R1312 Dysphagia, oropharyngeal phase: Secondary | ICD-10-CM | POA: Diagnosis not present

## 2023-01-27 DIAGNOSIS — I10 Essential (primary) hypertension: Secondary | ICD-10-CM | POA: Diagnosis not present

## 2023-01-27 DIAGNOSIS — G20A1 Parkinson's disease without dyskinesia, without mention of fluctuations: Secondary | ICD-10-CM | POA: Diagnosis not present

## 2023-01-27 DIAGNOSIS — E7849 Other hyperlipidemia: Secondary | ICD-10-CM | POA: Diagnosis not present

## 2023-01-27 DIAGNOSIS — E440 Moderate protein-calorie malnutrition: Secondary | ICD-10-CM | POA: Diagnosis not present

## 2023-01-27 DIAGNOSIS — E538 Deficiency of other specified B group vitamins: Secondary | ICD-10-CM | POA: Diagnosis not present

## 2023-01-27 DIAGNOSIS — E43 Unspecified severe protein-calorie malnutrition: Secondary | ICD-10-CM | POA: Diagnosis not present

## 2023-01-27 DIAGNOSIS — R2681 Unsteadiness on feet: Secondary | ICD-10-CM | POA: Diagnosis not present

## 2023-01-27 DIAGNOSIS — M6281 Muscle weakness (generalized): Secondary | ICD-10-CM | POA: Diagnosis not present

## 2023-01-28 DIAGNOSIS — E43 Unspecified severe protein-calorie malnutrition: Secondary | ICD-10-CM | POA: Diagnosis not present

## 2023-01-28 DIAGNOSIS — I10 Essential (primary) hypertension: Secondary | ICD-10-CM | POA: Diagnosis not present

## 2023-01-28 DIAGNOSIS — E7849 Other hyperlipidemia: Secondary | ICD-10-CM | POA: Diagnosis not present

## 2023-01-28 DIAGNOSIS — M6281 Muscle weakness (generalized): Secondary | ICD-10-CM | POA: Diagnosis not present

## 2023-01-28 DIAGNOSIS — E538 Deficiency of other specified B group vitamins: Secondary | ICD-10-CM | POA: Diagnosis not present

## 2023-01-28 DIAGNOSIS — R2681 Unsteadiness on feet: Secondary | ICD-10-CM | POA: Diagnosis not present

## 2023-01-28 DIAGNOSIS — G20A1 Parkinson's disease without dyskinesia, without mention of fluctuations: Secondary | ICD-10-CM | POA: Diagnosis not present

## 2023-01-28 DIAGNOSIS — R1312 Dysphagia, oropharyngeal phase: Secondary | ICD-10-CM | POA: Diagnosis not present

## 2023-01-29 DIAGNOSIS — R2681 Unsteadiness on feet: Secondary | ICD-10-CM | POA: Diagnosis not present

## 2023-01-29 DIAGNOSIS — E7849 Other hyperlipidemia: Secondary | ICD-10-CM | POA: Diagnosis not present

## 2023-01-29 DIAGNOSIS — E43 Unspecified severe protein-calorie malnutrition: Secondary | ICD-10-CM | POA: Diagnosis not present

## 2023-01-29 DIAGNOSIS — E538 Deficiency of other specified B group vitamins: Secondary | ICD-10-CM | POA: Diagnosis not present

## 2023-01-29 DIAGNOSIS — I10 Essential (primary) hypertension: Secondary | ICD-10-CM | POA: Diagnosis not present

## 2023-01-29 DIAGNOSIS — G20A1 Parkinson's disease without dyskinesia, without mention of fluctuations: Secondary | ICD-10-CM | POA: Diagnosis not present

## 2023-01-29 DIAGNOSIS — R1312 Dysphagia, oropharyngeal phase: Secondary | ICD-10-CM | POA: Diagnosis not present

## 2023-01-29 DIAGNOSIS — M6281 Muscle weakness (generalized): Secondary | ICD-10-CM | POA: Diagnosis not present

## 2023-01-30 DIAGNOSIS — R2681 Unsteadiness on feet: Secondary | ICD-10-CM | POA: Diagnosis not present

## 2023-01-30 DIAGNOSIS — E538 Deficiency of other specified B group vitamins: Secondary | ICD-10-CM | POA: Diagnosis not present

## 2023-01-30 DIAGNOSIS — I10 Essential (primary) hypertension: Secondary | ICD-10-CM | POA: Diagnosis not present

## 2023-01-30 DIAGNOSIS — M6281 Muscle weakness (generalized): Secondary | ICD-10-CM | POA: Diagnosis not present

## 2023-01-30 DIAGNOSIS — E43 Unspecified severe protein-calorie malnutrition: Secondary | ICD-10-CM | POA: Diagnosis not present

## 2023-01-30 DIAGNOSIS — R1312 Dysphagia, oropharyngeal phase: Secondary | ICD-10-CM | POA: Diagnosis not present

## 2023-01-30 DIAGNOSIS — G20A1 Parkinson's disease without dyskinesia, without mention of fluctuations: Secondary | ICD-10-CM | POA: Diagnosis not present

## 2023-01-30 DIAGNOSIS — E7849 Other hyperlipidemia: Secondary | ICD-10-CM | POA: Diagnosis not present

## 2023-02-01 DIAGNOSIS — M6281 Muscle weakness (generalized): Secondary | ICD-10-CM | POA: Diagnosis not present

## 2023-02-01 DIAGNOSIS — R1312 Dysphagia, oropharyngeal phase: Secondary | ICD-10-CM | POA: Diagnosis not present

## 2023-02-01 DIAGNOSIS — I10 Essential (primary) hypertension: Secondary | ICD-10-CM | POA: Diagnosis not present

## 2023-02-01 DIAGNOSIS — R2681 Unsteadiness on feet: Secondary | ICD-10-CM | POA: Diagnosis not present

## 2023-02-01 DIAGNOSIS — G20A1 Parkinson's disease without dyskinesia, without mention of fluctuations: Secondary | ICD-10-CM | POA: Diagnosis not present

## 2023-02-01 DIAGNOSIS — E538 Deficiency of other specified B group vitamins: Secondary | ICD-10-CM | POA: Diagnosis not present

## 2023-02-01 DIAGNOSIS — E7849 Other hyperlipidemia: Secondary | ICD-10-CM | POA: Diagnosis not present

## 2023-02-01 DIAGNOSIS — E43 Unspecified severe protein-calorie malnutrition: Secondary | ICD-10-CM | POA: Diagnosis not present

## 2023-02-03 DIAGNOSIS — R1312 Dysphagia, oropharyngeal phase: Secondary | ICD-10-CM | POA: Diagnosis not present

## 2023-02-03 DIAGNOSIS — R2681 Unsteadiness on feet: Secondary | ICD-10-CM | POA: Diagnosis not present

## 2023-02-03 DIAGNOSIS — I10 Essential (primary) hypertension: Secondary | ICD-10-CM | POA: Diagnosis not present

## 2023-02-03 DIAGNOSIS — M6281 Muscle weakness (generalized): Secondary | ICD-10-CM | POA: Diagnosis not present

## 2023-02-03 DIAGNOSIS — E43 Unspecified severe protein-calorie malnutrition: Secondary | ICD-10-CM | POA: Diagnosis not present

## 2023-02-03 DIAGNOSIS — E7849 Other hyperlipidemia: Secondary | ICD-10-CM | POA: Diagnosis not present

## 2023-02-03 DIAGNOSIS — E538 Deficiency of other specified B group vitamins: Secondary | ICD-10-CM | POA: Diagnosis not present

## 2023-02-03 DIAGNOSIS — G20A1 Parkinson's disease without dyskinesia, without mention of fluctuations: Secondary | ICD-10-CM | POA: Diagnosis not present

## 2023-02-04 DIAGNOSIS — E538 Deficiency of other specified B group vitamins: Secondary | ICD-10-CM | POA: Diagnosis not present

## 2023-02-04 DIAGNOSIS — G20A1 Parkinson's disease without dyskinesia, without mention of fluctuations: Secondary | ICD-10-CM | POA: Diagnosis not present

## 2023-02-04 DIAGNOSIS — R2681 Unsteadiness on feet: Secondary | ICD-10-CM | POA: Diagnosis not present

## 2023-02-04 DIAGNOSIS — E43 Unspecified severe protein-calorie malnutrition: Secondary | ICD-10-CM | POA: Diagnosis not present

## 2023-02-04 DIAGNOSIS — M6281 Muscle weakness (generalized): Secondary | ICD-10-CM | POA: Diagnosis not present

## 2023-02-04 DIAGNOSIS — R1312 Dysphagia, oropharyngeal phase: Secondary | ICD-10-CM | POA: Diagnosis not present

## 2023-02-04 DIAGNOSIS — E7849 Other hyperlipidemia: Secondary | ICD-10-CM | POA: Diagnosis not present

## 2023-02-04 DIAGNOSIS — I10 Essential (primary) hypertension: Secondary | ICD-10-CM | POA: Diagnosis not present

## 2023-02-05 DIAGNOSIS — E7849 Other hyperlipidemia: Secondary | ICD-10-CM | POA: Diagnosis not present

## 2023-02-05 DIAGNOSIS — E43 Unspecified severe protein-calorie malnutrition: Secondary | ICD-10-CM | POA: Diagnosis not present

## 2023-02-05 DIAGNOSIS — G20A1 Parkinson's disease without dyskinesia, without mention of fluctuations: Secondary | ICD-10-CM | POA: Diagnosis not present

## 2023-02-05 DIAGNOSIS — R2681 Unsteadiness on feet: Secondary | ICD-10-CM | POA: Diagnosis not present

## 2023-02-05 DIAGNOSIS — R1312 Dysphagia, oropharyngeal phase: Secondary | ICD-10-CM | POA: Diagnosis not present

## 2023-02-05 DIAGNOSIS — E538 Deficiency of other specified B group vitamins: Secondary | ICD-10-CM | POA: Diagnosis not present

## 2023-02-05 DIAGNOSIS — I10 Essential (primary) hypertension: Secondary | ICD-10-CM | POA: Diagnosis not present

## 2023-02-05 DIAGNOSIS — M6281 Muscle weakness (generalized): Secondary | ICD-10-CM | POA: Diagnosis not present

## 2023-02-06 DIAGNOSIS — M6281 Muscle weakness (generalized): Secondary | ICD-10-CM | POA: Diagnosis not present

## 2023-02-06 DIAGNOSIS — E538 Deficiency of other specified B group vitamins: Secondary | ICD-10-CM | POA: Diagnosis not present

## 2023-02-06 DIAGNOSIS — G20A1 Parkinson's disease without dyskinesia, without mention of fluctuations: Secondary | ICD-10-CM | POA: Diagnosis not present

## 2023-02-06 DIAGNOSIS — I10 Essential (primary) hypertension: Secondary | ICD-10-CM | POA: Diagnosis not present

## 2023-02-06 DIAGNOSIS — E7849 Other hyperlipidemia: Secondary | ICD-10-CM | POA: Diagnosis not present

## 2023-02-06 DIAGNOSIS — R2681 Unsteadiness on feet: Secondary | ICD-10-CM | POA: Diagnosis not present

## 2023-02-06 DIAGNOSIS — E43 Unspecified severe protein-calorie malnutrition: Secondary | ICD-10-CM | POA: Diagnosis not present

## 2023-02-06 DIAGNOSIS — R1312 Dysphagia, oropharyngeal phase: Secondary | ICD-10-CM | POA: Diagnosis not present

## 2023-02-07 DIAGNOSIS — K5909 Other constipation: Secondary | ICD-10-CM | POA: Diagnosis not present

## 2023-02-07 DIAGNOSIS — K219 Gastro-esophageal reflux disease without esophagitis: Secondary | ICD-10-CM | POA: Diagnosis not present

## 2023-02-07 DIAGNOSIS — G20A1 Parkinson's disease without dyskinesia, without mention of fluctuations: Secondary | ICD-10-CM | POA: Diagnosis not present

## 2023-02-07 DIAGNOSIS — E440 Moderate protein-calorie malnutrition: Secondary | ICD-10-CM | POA: Diagnosis not present

## 2023-02-09 DIAGNOSIS — R1312 Dysphagia, oropharyngeal phase: Secondary | ICD-10-CM | POA: Diagnosis not present

## 2023-02-09 DIAGNOSIS — E43 Unspecified severe protein-calorie malnutrition: Secondary | ICD-10-CM | POA: Diagnosis not present

## 2023-02-09 DIAGNOSIS — E7849 Other hyperlipidemia: Secondary | ICD-10-CM | POA: Diagnosis not present

## 2023-02-09 DIAGNOSIS — E538 Deficiency of other specified B group vitamins: Secondary | ICD-10-CM | POA: Diagnosis not present

## 2023-02-09 DIAGNOSIS — M6281 Muscle weakness (generalized): Secondary | ICD-10-CM | POA: Diagnosis not present

## 2023-02-09 DIAGNOSIS — G20A1 Parkinson's disease without dyskinesia, without mention of fluctuations: Secondary | ICD-10-CM | POA: Diagnosis not present

## 2023-02-09 DIAGNOSIS — I10 Essential (primary) hypertension: Secondary | ICD-10-CM | POA: Diagnosis not present

## 2023-02-09 DIAGNOSIS — R2681 Unsteadiness on feet: Secondary | ICD-10-CM | POA: Diagnosis not present

## 2023-02-10 DIAGNOSIS — R1312 Dysphagia, oropharyngeal phase: Secondary | ICD-10-CM | POA: Diagnosis not present

## 2023-02-10 DIAGNOSIS — M6281 Muscle weakness (generalized): Secondary | ICD-10-CM | POA: Diagnosis not present

## 2023-02-10 DIAGNOSIS — E43 Unspecified severe protein-calorie malnutrition: Secondary | ICD-10-CM | POA: Diagnosis not present

## 2023-02-10 DIAGNOSIS — E538 Deficiency of other specified B group vitamins: Secondary | ICD-10-CM | POA: Diagnosis not present

## 2023-02-10 DIAGNOSIS — G20A1 Parkinson's disease without dyskinesia, without mention of fluctuations: Secondary | ICD-10-CM | POA: Diagnosis not present

## 2023-02-10 DIAGNOSIS — E7849 Other hyperlipidemia: Secondary | ICD-10-CM | POA: Diagnosis not present

## 2023-02-10 DIAGNOSIS — I10 Essential (primary) hypertension: Secondary | ICD-10-CM | POA: Diagnosis not present

## 2023-02-10 DIAGNOSIS — R2681 Unsteadiness on feet: Secondary | ICD-10-CM | POA: Diagnosis not present

## 2023-02-11 DIAGNOSIS — G20A1 Parkinson's disease without dyskinesia, without mention of fluctuations: Secondary | ICD-10-CM | POA: Diagnosis not present

## 2023-02-11 DIAGNOSIS — I10 Essential (primary) hypertension: Secondary | ICD-10-CM | POA: Diagnosis not present

## 2023-02-11 DIAGNOSIS — E440 Moderate protein-calorie malnutrition: Secondary | ICD-10-CM | POA: Diagnosis not present

## 2023-02-12 DIAGNOSIS — E43 Unspecified severe protein-calorie malnutrition: Secondary | ICD-10-CM | POA: Diagnosis not present

## 2023-02-12 DIAGNOSIS — G20A1 Parkinson's disease without dyskinesia, without mention of fluctuations: Secondary | ICD-10-CM | POA: Diagnosis not present

## 2023-02-12 DIAGNOSIS — E7849 Other hyperlipidemia: Secondary | ICD-10-CM | POA: Diagnosis not present

## 2023-02-12 DIAGNOSIS — I10 Essential (primary) hypertension: Secondary | ICD-10-CM | POA: Diagnosis not present

## 2023-02-12 DIAGNOSIS — R1312 Dysphagia, oropharyngeal phase: Secondary | ICD-10-CM | POA: Diagnosis not present

## 2023-02-12 DIAGNOSIS — R2681 Unsteadiness on feet: Secondary | ICD-10-CM | POA: Diagnosis not present

## 2023-02-12 DIAGNOSIS — E538 Deficiency of other specified B group vitamins: Secondary | ICD-10-CM | POA: Diagnosis not present

## 2023-02-12 DIAGNOSIS — M6281 Muscle weakness (generalized): Secondary | ICD-10-CM | POA: Diagnosis not present

## 2023-02-13 DIAGNOSIS — M6282 Rhabdomyolysis: Secondary | ICD-10-CM | POA: Diagnosis not present

## 2023-02-13 DIAGNOSIS — R269 Unspecified abnormalities of gait and mobility: Secondary | ICD-10-CM | POA: Diagnosis not present

## 2023-02-13 DIAGNOSIS — E43 Unspecified severe protein-calorie malnutrition: Secondary | ICD-10-CM | POA: Diagnosis not present

## 2023-02-13 DIAGNOSIS — I639 Cerebral infarction, unspecified: Secondary | ICD-10-CM | POA: Diagnosis not present

## 2023-02-16 DIAGNOSIS — R2681 Unsteadiness on feet: Secondary | ICD-10-CM | POA: Diagnosis not present

## 2023-02-16 DIAGNOSIS — E538 Deficiency of other specified B group vitamins: Secondary | ICD-10-CM | POA: Diagnosis not present

## 2023-02-16 DIAGNOSIS — I10 Essential (primary) hypertension: Secondary | ICD-10-CM | POA: Diagnosis not present

## 2023-02-16 DIAGNOSIS — R1312 Dysphagia, oropharyngeal phase: Secondary | ICD-10-CM | POA: Diagnosis not present

## 2023-02-16 DIAGNOSIS — E43 Unspecified severe protein-calorie malnutrition: Secondary | ICD-10-CM | POA: Diagnosis not present

## 2023-02-16 DIAGNOSIS — E7849 Other hyperlipidemia: Secondary | ICD-10-CM | POA: Diagnosis not present

## 2023-02-16 DIAGNOSIS — G20A1 Parkinson's disease without dyskinesia, without mention of fluctuations: Secondary | ICD-10-CM | POA: Diagnosis not present

## 2023-02-16 DIAGNOSIS — M6281 Muscle weakness (generalized): Secondary | ICD-10-CM | POA: Diagnosis not present

## 2023-02-17 DIAGNOSIS — E7849 Other hyperlipidemia: Secondary | ICD-10-CM | POA: Diagnosis not present

## 2023-02-17 DIAGNOSIS — I10 Essential (primary) hypertension: Secondary | ICD-10-CM | POA: Diagnosis not present

## 2023-02-17 DIAGNOSIS — G20A1 Parkinson's disease without dyskinesia, without mention of fluctuations: Secondary | ICD-10-CM | POA: Diagnosis not present

## 2023-02-17 DIAGNOSIS — R1312 Dysphagia, oropharyngeal phase: Secondary | ICD-10-CM | POA: Diagnosis not present

## 2023-02-17 DIAGNOSIS — E43 Unspecified severe protein-calorie malnutrition: Secondary | ICD-10-CM | POA: Diagnosis not present

## 2023-02-17 DIAGNOSIS — E440 Moderate protein-calorie malnutrition: Secondary | ICD-10-CM | POA: Diagnosis not present

## 2023-02-17 DIAGNOSIS — R2681 Unsteadiness on feet: Secondary | ICD-10-CM | POA: Diagnosis not present

## 2023-02-17 DIAGNOSIS — M6281 Muscle weakness (generalized): Secondary | ICD-10-CM | POA: Diagnosis not present

## 2023-02-17 DIAGNOSIS — K219 Gastro-esophageal reflux disease without esophagitis: Secondary | ICD-10-CM | POA: Diagnosis not present

## 2023-02-17 DIAGNOSIS — E538 Deficiency of other specified B group vitamins: Secondary | ICD-10-CM | POA: Diagnosis not present

## 2023-02-18 DIAGNOSIS — E7849 Other hyperlipidemia: Secondary | ICD-10-CM | POA: Diagnosis not present

## 2023-02-18 DIAGNOSIS — G20A1 Parkinson's disease without dyskinesia, without mention of fluctuations: Secondary | ICD-10-CM | POA: Diagnosis not present

## 2023-02-18 DIAGNOSIS — E538 Deficiency of other specified B group vitamins: Secondary | ICD-10-CM | POA: Diagnosis not present

## 2023-02-18 DIAGNOSIS — R2681 Unsteadiness on feet: Secondary | ICD-10-CM | POA: Diagnosis not present

## 2023-02-18 DIAGNOSIS — R1312 Dysphagia, oropharyngeal phase: Secondary | ICD-10-CM | POA: Diagnosis not present

## 2023-02-18 DIAGNOSIS — M6281 Muscle weakness (generalized): Secondary | ICD-10-CM | POA: Diagnosis not present

## 2023-02-18 DIAGNOSIS — I10 Essential (primary) hypertension: Secondary | ICD-10-CM | POA: Diagnosis not present

## 2023-02-18 DIAGNOSIS — E43 Unspecified severe protein-calorie malnutrition: Secondary | ICD-10-CM | POA: Diagnosis not present

## 2023-02-19 DIAGNOSIS — I10 Essential (primary) hypertension: Secondary | ICD-10-CM | POA: Diagnosis not present

## 2023-02-19 DIAGNOSIS — R2681 Unsteadiness on feet: Secondary | ICD-10-CM | POA: Diagnosis not present

## 2023-02-19 DIAGNOSIS — G20A1 Parkinson's disease without dyskinesia, without mention of fluctuations: Secondary | ICD-10-CM | POA: Diagnosis not present

## 2023-02-19 DIAGNOSIS — E43 Unspecified severe protein-calorie malnutrition: Secondary | ICD-10-CM | POA: Diagnosis not present

## 2023-02-19 DIAGNOSIS — E7849 Other hyperlipidemia: Secondary | ICD-10-CM | POA: Diagnosis not present

## 2023-02-19 DIAGNOSIS — E538 Deficiency of other specified B group vitamins: Secondary | ICD-10-CM | POA: Diagnosis not present

## 2023-02-19 DIAGNOSIS — M6281 Muscle weakness (generalized): Secondary | ICD-10-CM | POA: Diagnosis not present

## 2023-02-19 DIAGNOSIS — R1312 Dysphagia, oropharyngeal phase: Secondary | ICD-10-CM | POA: Diagnosis not present

## 2023-02-20 DIAGNOSIS — I10 Essential (primary) hypertension: Secondary | ICD-10-CM | POA: Diagnosis not present

## 2023-02-20 DIAGNOSIS — E538 Deficiency of other specified B group vitamins: Secondary | ICD-10-CM | POA: Diagnosis not present

## 2023-02-20 DIAGNOSIS — G20A1 Parkinson's disease without dyskinesia, without mention of fluctuations: Secondary | ICD-10-CM | POA: Diagnosis not present

## 2023-02-20 DIAGNOSIS — E43 Unspecified severe protein-calorie malnutrition: Secondary | ICD-10-CM | POA: Diagnosis not present

## 2023-02-20 DIAGNOSIS — R1312 Dysphagia, oropharyngeal phase: Secondary | ICD-10-CM | POA: Diagnosis not present

## 2023-02-20 DIAGNOSIS — R2681 Unsteadiness on feet: Secondary | ICD-10-CM | POA: Diagnosis not present

## 2023-02-20 DIAGNOSIS — M6281 Muscle weakness (generalized): Secondary | ICD-10-CM | POA: Diagnosis not present

## 2023-02-20 DIAGNOSIS — E7849 Other hyperlipidemia: Secondary | ICD-10-CM | POA: Diagnosis not present

## 2023-02-23 DIAGNOSIS — E7849 Other hyperlipidemia: Secondary | ICD-10-CM | POA: Diagnosis not present

## 2023-02-23 DIAGNOSIS — R2681 Unsteadiness on feet: Secondary | ICD-10-CM | POA: Diagnosis not present

## 2023-02-23 DIAGNOSIS — G20A1 Parkinson's disease without dyskinesia, without mention of fluctuations: Secondary | ICD-10-CM | POA: Diagnosis not present

## 2023-02-23 DIAGNOSIS — R1312 Dysphagia, oropharyngeal phase: Secondary | ICD-10-CM | POA: Diagnosis not present

## 2023-02-23 DIAGNOSIS — E538 Deficiency of other specified B group vitamins: Secondary | ICD-10-CM | POA: Diagnosis not present

## 2023-02-23 DIAGNOSIS — E43 Unspecified severe protein-calorie malnutrition: Secondary | ICD-10-CM | POA: Diagnosis not present

## 2023-02-23 DIAGNOSIS — M6281 Muscle weakness (generalized): Secondary | ICD-10-CM | POA: Diagnosis not present

## 2023-02-23 DIAGNOSIS — I10 Essential (primary) hypertension: Secondary | ICD-10-CM | POA: Diagnosis not present

## 2023-02-24 DIAGNOSIS — G20A1 Parkinson's disease without dyskinesia, without mention of fluctuations: Secondary | ICD-10-CM | POA: Diagnosis not present

## 2023-02-24 DIAGNOSIS — M6281 Muscle weakness (generalized): Secondary | ICD-10-CM | POA: Diagnosis not present

## 2023-02-24 DIAGNOSIS — F02B18 Dementia in other diseases classified elsewhere, moderate, with other behavioral disturbance: Secondary | ICD-10-CM | POA: Diagnosis not present

## 2023-02-24 DIAGNOSIS — R2681 Unsteadiness on feet: Secondary | ICD-10-CM | POA: Diagnosis not present

## 2023-02-24 DIAGNOSIS — R1312 Dysphagia, oropharyngeal phase: Secondary | ICD-10-CM | POA: Diagnosis not present

## 2023-02-24 DIAGNOSIS — E538 Deficiency of other specified B group vitamins: Secondary | ICD-10-CM | POA: Diagnosis not present

## 2023-02-24 DIAGNOSIS — I10 Essential (primary) hypertension: Secondary | ICD-10-CM | POA: Diagnosis not present

## 2023-02-24 DIAGNOSIS — E43 Unspecified severe protein-calorie malnutrition: Secondary | ICD-10-CM | POA: Diagnosis not present

## 2023-02-24 DIAGNOSIS — G20C Parkinsonism, unspecified: Secondary | ICD-10-CM | POA: Diagnosis not present

## 2023-02-24 DIAGNOSIS — E7849 Other hyperlipidemia: Secondary | ICD-10-CM | POA: Diagnosis not present

## 2023-02-24 DIAGNOSIS — F5101 Primary insomnia: Secondary | ICD-10-CM | POA: Diagnosis not present

## 2023-02-25 DIAGNOSIS — R2681 Unsteadiness on feet: Secondary | ICD-10-CM | POA: Diagnosis not present

## 2023-02-25 DIAGNOSIS — G20A1 Parkinson's disease without dyskinesia, without mention of fluctuations: Secondary | ICD-10-CM | POA: Diagnosis not present

## 2023-02-25 DIAGNOSIS — R1312 Dysphagia, oropharyngeal phase: Secondary | ICD-10-CM | POA: Diagnosis not present

## 2023-02-25 DIAGNOSIS — R278 Other lack of coordination: Secondary | ICD-10-CM | POA: Diagnosis not present

## 2023-02-25 DIAGNOSIS — M6281 Muscle weakness (generalized): Secondary | ICD-10-CM | POA: Diagnosis not present

## 2023-02-25 DIAGNOSIS — K219 Gastro-esophageal reflux disease without esophagitis: Secondary | ICD-10-CM | POA: Diagnosis not present

## 2023-02-25 DIAGNOSIS — E43 Unspecified severe protein-calorie malnutrition: Secondary | ICD-10-CM | POA: Diagnosis not present

## 2023-02-25 DIAGNOSIS — E538 Deficiency of other specified B group vitamins: Secondary | ICD-10-CM | POA: Diagnosis not present

## 2023-02-25 DIAGNOSIS — I10 Essential (primary) hypertension: Secondary | ICD-10-CM | POA: Diagnosis not present

## 2023-02-25 DIAGNOSIS — E7849 Other hyperlipidemia: Secondary | ICD-10-CM | POA: Diagnosis not present

## 2023-02-25 DIAGNOSIS — E440 Moderate protein-calorie malnutrition: Secondary | ICD-10-CM | POA: Diagnosis not present

## 2023-02-26 DIAGNOSIS — M6281 Muscle weakness (generalized): Secondary | ICD-10-CM | POA: Diagnosis not present

## 2023-02-26 DIAGNOSIS — R2681 Unsteadiness on feet: Secondary | ICD-10-CM | POA: Diagnosis not present

## 2023-02-26 DIAGNOSIS — R1312 Dysphagia, oropharyngeal phase: Secondary | ICD-10-CM | POA: Diagnosis not present

## 2023-02-26 DIAGNOSIS — I10 Essential (primary) hypertension: Secondary | ICD-10-CM | POA: Diagnosis not present

## 2023-02-26 DIAGNOSIS — R278 Other lack of coordination: Secondary | ICD-10-CM | POA: Diagnosis not present

## 2023-02-26 DIAGNOSIS — E538 Deficiency of other specified B group vitamins: Secondary | ICD-10-CM | POA: Diagnosis not present

## 2023-02-26 DIAGNOSIS — E43 Unspecified severe protein-calorie malnutrition: Secondary | ICD-10-CM | POA: Diagnosis not present

## 2023-02-26 DIAGNOSIS — G20A1 Parkinson's disease without dyskinesia, without mention of fluctuations: Secondary | ICD-10-CM | POA: Diagnosis not present

## 2023-02-26 DIAGNOSIS — E7849 Other hyperlipidemia: Secondary | ICD-10-CM | POA: Diagnosis not present

## 2023-02-27 DIAGNOSIS — R1312 Dysphagia, oropharyngeal phase: Secondary | ICD-10-CM | POA: Diagnosis not present

## 2023-02-27 DIAGNOSIS — E538 Deficiency of other specified B group vitamins: Secondary | ICD-10-CM | POA: Diagnosis not present

## 2023-02-27 DIAGNOSIS — E43 Unspecified severe protein-calorie malnutrition: Secondary | ICD-10-CM | POA: Diagnosis not present

## 2023-02-27 DIAGNOSIS — E7849 Other hyperlipidemia: Secondary | ICD-10-CM | POA: Diagnosis not present

## 2023-02-27 DIAGNOSIS — R2681 Unsteadiness on feet: Secondary | ICD-10-CM | POA: Diagnosis not present

## 2023-02-27 DIAGNOSIS — G20A1 Parkinson's disease without dyskinesia, without mention of fluctuations: Secondary | ICD-10-CM | POA: Diagnosis not present

## 2023-02-27 DIAGNOSIS — R278 Other lack of coordination: Secondary | ICD-10-CM | POA: Diagnosis not present

## 2023-02-27 DIAGNOSIS — I10 Essential (primary) hypertension: Secondary | ICD-10-CM | POA: Diagnosis not present

## 2023-02-27 DIAGNOSIS — M6281 Muscle weakness (generalized): Secondary | ICD-10-CM | POA: Diagnosis not present

## 2023-03-02 DIAGNOSIS — E538 Deficiency of other specified B group vitamins: Secondary | ICD-10-CM | POA: Diagnosis not present

## 2023-03-02 DIAGNOSIS — M6281 Muscle weakness (generalized): Secondary | ICD-10-CM | POA: Diagnosis not present

## 2023-03-02 DIAGNOSIS — I10 Essential (primary) hypertension: Secondary | ICD-10-CM | POA: Diagnosis not present

## 2023-03-02 DIAGNOSIS — R278 Other lack of coordination: Secondary | ICD-10-CM | POA: Diagnosis not present

## 2023-03-02 DIAGNOSIS — R2681 Unsteadiness on feet: Secondary | ICD-10-CM | POA: Diagnosis not present

## 2023-03-02 DIAGNOSIS — E43 Unspecified severe protein-calorie malnutrition: Secondary | ICD-10-CM | POA: Diagnosis not present

## 2023-03-02 DIAGNOSIS — E7849 Other hyperlipidemia: Secondary | ICD-10-CM | POA: Diagnosis not present

## 2023-03-02 DIAGNOSIS — G20A1 Parkinson's disease without dyskinesia, without mention of fluctuations: Secondary | ICD-10-CM | POA: Diagnosis not present

## 2023-03-02 DIAGNOSIS — R1312 Dysphagia, oropharyngeal phase: Secondary | ICD-10-CM | POA: Diagnosis not present

## 2023-03-03 DIAGNOSIS — I10 Essential (primary) hypertension: Secondary | ICD-10-CM | POA: Diagnosis not present

## 2023-03-03 DIAGNOSIS — R1312 Dysphagia, oropharyngeal phase: Secondary | ICD-10-CM | POA: Diagnosis not present

## 2023-03-03 DIAGNOSIS — R278 Other lack of coordination: Secondary | ICD-10-CM | POA: Diagnosis not present

## 2023-03-03 DIAGNOSIS — G20A1 Parkinson's disease without dyskinesia, without mention of fluctuations: Secondary | ICD-10-CM | POA: Diagnosis not present

## 2023-03-03 DIAGNOSIS — R2681 Unsteadiness on feet: Secondary | ICD-10-CM | POA: Diagnosis not present

## 2023-03-03 DIAGNOSIS — E7849 Other hyperlipidemia: Secondary | ICD-10-CM | POA: Diagnosis not present

## 2023-03-03 DIAGNOSIS — E43 Unspecified severe protein-calorie malnutrition: Secondary | ICD-10-CM | POA: Diagnosis not present

## 2023-03-03 DIAGNOSIS — E538 Deficiency of other specified B group vitamins: Secondary | ICD-10-CM | POA: Diagnosis not present

## 2023-03-03 DIAGNOSIS — M6281 Muscle weakness (generalized): Secondary | ICD-10-CM | POA: Diagnosis not present

## 2023-03-04 DIAGNOSIS — E538 Deficiency of other specified B group vitamins: Secondary | ICD-10-CM | POA: Diagnosis not present

## 2023-03-04 DIAGNOSIS — M6281 Muscle weakness (generalized): Secondary | ICD-10-CM | POA: Diagnosis not present

## 2023-03-04 DIAGNOSIS — R278 Other lack of coordination: Secondary | ICD-10-CM | POA: Diagnosis not present

## 2023-03-04 DIAGNOSIS — E43 Unspecified severe protein-calorie malnutrition: Secondary | ICD-10-CM | POA: Diagnosis not present

## 2023-03-04 DIAGNOSIS — I10 Essential (primary) hypertension: Secondary | ICD-10-CM | POA: Diagnosis not present

## 2023-03-04 DIAGNOSIS — G20A1 Parkinson's disease without dyskinesia, without mention of fluctuations: Secondary | ICD-10-CM | POA: Diagnosis not present

## 2023-03-04 DIAGNOSIS — R2681 Unsteadiness on feet: Secondary | ICD-10-CM | POA: Diagnosis not present

## 2023-03-04 DIAGNOSIS — R1312 Dysphagia, oropharyngeal phase: Secondary | ICD-10-CM | POA: Diagnosis not present

## 2023-03-04 DIAGNOSIS — E7849 Other hyperlipidemia: Secondary | ICD-10-CM | POA: Diagnosis not present

## 2023-03-05 DIAGNOSIS — E43 Unspecified severe protein-calorie malnutrition: Secondary | ICD-10-CM | POA: Diagnosis not present

## 2023-03-05 DIAGNOSIS — E538 Deficiency of other specified B group vitamins: Secondary | ICD-10-CM | POA: Diagnosis not present

## 2023-03-05 DIAGNOSIS — G20A1 Parkinson's disease without dyskinesia, without mention of fluctuations: Secondary | ICD-10-CM | POA: Diagnosis not present

## 2023-03-05 DIAGNOSIS — K219 Gastro-esophageal reflux disease without esophagitis: Secondary | ICD-10-CM | POA: Diagnosis not present

## 2023-03-05 DIAGNOSIS — E7849 Other hyperlipidemia: Secondary | ICD-10-CM | POA: Diagnosis not present

## 2023-03-05 DIAGNOSIS — M6281 Muscle weakness (generalized): Secondary | ICD-10-CM | POA: Diagnosis not present

## 2023-03-05 DIAGNOSIS — R1312 Dysphagia, oropharyngeal phase: Secondary | ICD-10-CM | POA: Diagnosis not present

## 2023-03-05 DIAGNOSIS — I10 Essential (primary) hypertension: Secondary | ICD-10-CM | POA: Diagnosis not present

## 2023-03-05 DIAGNOSIS — R278 Other lack of coordination: Secondary | ICD-10-CM | POA: Diagnosis not present

## 2023-03-05 DIAGNOSIS — R2681 Unsteadiness on feet: Secondary | ICD-10-CM | POA: Diagnosis not present

## 2023-03-06 DIAGNOSIS — M6281 Muscle weakness (generalized): Secondary | ICD-10-CM | POA: Diagnosis not present

## 2023-03-06 DIAGNOSIS — E538 Deficiency of other specified B group vitamins: Secondary | ICD-10-CM | POA: Diagnosis not present

## 2023-03-06 DIAGNOSIS — G20A1 Parkinson's disease without dyskinesia, without mention of fluctuations: Secondary | ICD-10-CM | POA: Diagnosis not present

## 2023-03-06 DIAGNOSIS — R2681 Unsteadiness on feet: Secondary | ICD-10-CM | POA: Diagnosis not present

## 2023-03-06 DIAGNOSIS — R278 Other lack of coordination: Secondary | ICD-10-CM | POA: Diagnosis not present

## 2023-03-06 DIAGNOSIS — E7849 Other hyperlipidemia: Secondary | ICD-10-CM | POA: Diagnosis not present

## 2023-03-06 DIAGNOSIS — I10 Essential (primary) hypertension: Secondary | ICD-10-CM | POA: Diagnosis not present

## 2023-03-06 DIAGNOSIS — R1312 Dysphagia, oropharyngeal phase: Secondary | ICD-10-CM | POA: Diagnosis not present

## 2023-03-06 DIAGNOSIS — E43 Unspecified severe protein-calorie malnutrition: Secondary | ICD-10-CM | POA: Diagnosis not present

## 2023-03-09 DIAGNOSIS — E440 Moderate protein-calorie malnutrition: Secondary | ICD-10-CM | POA: Diagnosis not present

## 2023-03-09 DIAGNOSIS — I10 Essential (primary) hypertension: Secondary | ICD-10-CM | POA: Diagnosis not present

## 2023-03-09 DIAGNOSIS — E43 Unspecified severe protein-calorie malnutrition: Secondary | ICD-10-CM | POA: Diagnosis not present

## 2023-03-09 DIAGNOSIS — G20A1 Parkinson's disease without dyskinesia, without mention of fluctuations: Secondary | ICD-10-CM | POA: Diagnosis not present

## 2023-03-09 DIAGNOSIS — R1312 Dysphagia, oropharyngeal phase: Secondary | ICD-10-CM | POA: Diagnosis not present

## 2023-03-09 DIAGNOSIS — E538 Deficiency of other specified B group vitamins: Secondary | ICD-10-CM | POA: Diagnosis not present

## 2023-03-09 DIAGNOSIS — K219 Gastro-esophageal reflux disease without esophagitis: Secondary | ICD-10-CM | POA: Diagnosis not present

## 2023-03-09 DIAGNOSIS — M6281 Muscle weakness (generalized): Secondary | ICD-10-CM | POA: Diagnosis not present

## 2023-03-09 DIAGNOSIS — E7849 Other hyperlipidemia: Secondary | ICD-10-CM | POA: Diagnosis not present

## 2023-03-09 DIAGNOSIS — R2681 Unsteadiness on feet: Secondary | ICD-10-CM | POA: Diagnosis not present

## 2023-03-09 DIAGNOSIS — R278 Other lack of coordination: Secondary | ICD-10-CM | POA: Diagnosis not present

## 2023-03-10 DIAGNOSIS — M6281 Muscle weakness (generalized): Secondary | ICD-10-CM | POA: Diagnosis not present

## 2023-03-10 DIAGNOSIS — E7849 Other hyperlipidemia: Secondary | ICD-10-CM | POA: Diagnosis not present

## 2023-03-10 DIAGNOSIS — E43 Unspecified severe protein-calorie malnutrition: Secondary | ICD-10-CM | POA: Diagnosis not present

## 2023-03-10 DIAGNOSIS — R2681 Unsteadiness on feet: Secondary | ICD-10-CM | POA: Diagnosis not present

## 2023-03-10 DIAGNOSIS — G20A1 Parkinson's disease without dyskinesia, without mention of fluctuations: Secondary | ICD-10-CM | POA: Diagnosis not present

## 2023-03-10 DIAGNOSIS — R1312 Dysphagia, oropharyngeal phase: Secondary | ICD-10-CM | POA: Diagnosis not present

## 2023-03-10 DIAGNOSIS — R278 Other lack of coordination: Secondary | ICD-10-CM | POA: Diagnosis not present

## 2023-03-10 DIAGNOSIS — E119 Type 2 diabetes mellitus without complications: Secondary | ICD-10-CM | POA: Diagnosis not present

## 2023-03-10 DIAGNOSIS — I10 Essential (primary) hypertension: Secondary | ICD-10-CM | POA: Diagnosis not present

## 2023-03-10 DIAGNOSIS — E538 Deficiency of other specified B group vitamins: Secondary | ICD-10-CM | POA: Diagnosis not present

## 2023-03-11 DIAGNOSIS — E7849 Other hyperlipidemia: Secondary | ICD-10-CM | POA: Diagnosis not present

## 2023-03-11 DIAGNOSIS — R278 Other lack of coordination: Secondary | ICD-10-CM | POA: Diagnosis not present

## 2023-03-11 DIAGNOSIS — R1312 Dysphagia, oropharyngeal phase: Secondary | ICD-10-CM | POA: Diagnosis not present

## 2023-03-11 DIAGNOSIS — R7982 Elevated C-reactive protein (CRP): Secondary | ICD-10-CM | POA: Diagnosis not present

## 2023-03-11 DIAGNOSIS — M6281 Muscle weakness (generalized): Secondary | ICD-10-CM | POA: Diagnosis not present

## 2023-03-11 DIAGNOSIS — G20A1 Parkinson's disease without dyskinesia, without mention of fluctuations: Secondary | ICD-10-CM | POA: Diagnosis not present

## 2023-03-11 DIAGNOSIS — I959 Hypotension, unspecified: Secondary | ICD-10-CM | POA: Diagnosis not present

## 2023-03-11 DIAGNOSIS — R2681 Unsteadiness on feet: Secondary | ICD-10-CM | POA: Diagnosis not present

## 2023-03-11 DIAGNOSIS — I10 Essential (primary) hypertension: Secondary | ICD-10-CM | POA: Diagnosis not present

## 2023-03-11 DIAGNOSIS — E538 Deficiency of other specified B group vitamins: Secondary | ICD-10-CM | POA: Diagnosis not present

## 2023-03-11 DIAGNOSIS — E43 Unspecified severe protein-calorie malnutrition: Secondary | ICD-10-CM | POA: Diagnosis not present

## 2023-03-12 DIAGNOSIS — R1312 Dysphagia, oropharyngeal phase: Secondary | ICD-10-CM | POA: Diagnosis not present

## 2023-03-12 DIAGNOSIS — R278 Other lack of coordination: Secondary | ICD-10-CM | POA: Diagnosis not present

## 2023-03-12 DIAGNOSIS — E538 Deficiency of other specified B group vitamins: Secondary | ICD-10-CM | POA: Diagnosis not present

## 2023-03-12 DIAGNOSIS — I10 Essential (primary) hypertension: Secondary | ICD-10-CM | POA: Diagnosis not present

## 2023-03-12 DIAGNOSIS — M6281 Muscle weakness (generalized): Secondary | ICD-10-CM | POA: Diagnosis not present

## 2023-03-12 DIAGNOSIS — E43 Unspecified severe protein-calorie malnutrition: Secondary | ICD-10-CM | POA: Diagnosis not present

## 2023-03-12 DIAGNOSIS — K219 Gastro-esophageal reflux disease without esophagitis: Secondary | ICD-10-CM | POA: Diagnosis not present

## 2023-03-12 DIAGNOSIS — R2681 Unsteadiness on feet: Secondary | ICD-10-CM | POA: Diagnosis not present

## 2023-03-12 DIAGNOSIS — E7849 Other hyperlipidemia: Secondary | ICD-10-CM | POA: Diagnosis not present

## 2023-03-12 DIAGNOSIS — G20A1 Parkinson's disease without dyskinesia, without mention of fluctuations: Secondary | ICD-10-CM | POA: Diagnosis not present

## 2023-03-13 DIAGNOSIS — R269 Unspecified abnormalities of gait and mobility: Secondary | ICD-10-CM | POA: Diagnosis not present

## 2023-03-13 DIAGNOSIS — I639 Cerebral infarction, unspecified: Secondary | ICD-10-CM | POA: Diagnosis not present

## 2023-03-13 DIAGNOSIS — R278 Other lack of coordination: Secondary | ICD-10-CM | POA: Diagnosis not present

## 2023-03-13 DIAGNOSIS — E7849 Other hyperlipidemia: Secondary | ICD-10-CM | POA: Diagnosis not present

## 2023-03-13 DIAGNOSIS — E538 Deficiency of other specified B group vitamins: Secondary | ICD-10-CM | POA: Diagnosis not present

## 2023-03-13 DIAGNOSIS — I10 Essential (primary) hypertension: Secondary | ICD-10-CM | POA: Diagnosis not present

## 2023-03-13 DIAGNOSIS — M6282 Rhabdomyolysis: Secondary | ICD-10-CM | POA: Diagnosis not present

## 2023-03-13 DIAGNOSIS — E43 Unspecified severe protein-calorie malnutrition: Secondary | ICD-10-CM | POA: Diagnosis not present

## 2023-03-13 DIAGNOSIS — R2681 Unsteadiness on feet: Secondary | ICD-10-CM | POA: Diagnosis not present

## 2023-03-13 DIAGNOSIS — M6281 Muscle weakness (generalized): Secondary | ICD-10-CM | POA: Diagnosis not present

## 2023-03-13 DIAGNOSIS — R1312 Dysphagia, oropharyngeal phase: Secondary | ICD-10-CM | POA: Diagnosis not present

## 2023-03-13 DIAGNOSIS — G20A1 Parkinson's disease without dyskinesia, without mention of fluctuations: Secondary | ICD-10-CM | POA: Diagnosis not present

## 2023-03-16 DIAGNOSIS — I10 Essential (primary) hypertension: Secondary | ICD-10-CM | POA: Diagnosis not present

## 2023-03-16 DIAGNOSIS — E7849 Other hyperlipidemia: Secondary | ICD-10-CM | POA: Diagnosis not present

## 2023-03-16 DIAGNOSIS — R1312 Dysphagia, oropharyngeal phase: Secondary | ICD-10-CM | POA: Diagnosis not present

## 2023-03-16 DIAGNOSIS — E538 Deficiency of other specified B group vitamins: Secondary | ICD-10-CM | POA: Diagnosis not present

## 2023-03-16 DIAGNOSIS — E43 Unspecified severe protein-calorie malnutrition: Secondary | ICD-10-CM | POA: Diagnosis not present

## 2023-03-16 DIAGNOSIS — R2681 Unsteadiness on feet: Secondary | ICD-10-CM | POA: Diagnosis not present

## 2023-03-16 DIAGNOSIS — R278 Other lack of coordination: Secondary | ICD-10-CM | POA: Diagnosis not present

## 2023-03-16 DIAGNOSIS — G20A1 Parkinson's disease without dyskinesia, without mention of fluctuations: Secondary | ICD-10-CM | POA: Diagnosis not present

## 2023-03-16 DIAGNOSIS — M6281 Muscle weakness (generalized): Secondary | ICD-10-CM | POA: Diagnosis not present

## 2023-03-17 DIAGNOSIS — I10 Essential (primary) hypertension: Secondary | ICD-10-CM | POA: Diagnosis not present

## 2023-03-17 DIAGNOSIS — K219 Gastro-esophageal reflux disease without esophagitis: Secondary | ICD-10-CM | POA: Diagnosis not present

## 2023-03-17 DIAGNOSIS — G20A1 Parkinson's disease without dyskinesia, without mention of fluctuations: Secondary | ICD-10-CM | POA: Diagnosis not present

## 2023-03-21 DIAGNOSIS — I10 Essential (primary) hypertension: Secondary | ICD-10-CM | POA: Diagnosis not present

## 2023-03-21 DIAGNOSIS — E440 Moderate protein-calorie malnutrition: Secondary | ICD-10-CM | POA: Diagnosis not present

## 2023-03-21 DIAGNOSIS — K219 Gastro-esophageal reflux disease without esophagitis: Secondary | ICD-10-CM | POA: Diagnosis not present

## 2023-03-21 DIAGNOSIS — K5909 Other constipation: Secondary | ICD-10-CM | POA: Diagnosis not present

## 2023-03-21 DIAGNOSIS — R296 Repeated falls: Secondary | ICD-10-CM | POA: Diagnosis not present

## 2023-03-21 DIAGNOSIS — G20A1 Parkinson's disease without dyskinesia, without mention of fluctuations: Secondary | ICD-10-CM | POA: Diagnosis not present

## 2023-03-22 DIAGNOSIS — W19XXXA Unspecified fall, initial encounter: Secondary | ICD-10-CM | POA: Diagnosis not present

## 2023-03-22 DIAGNOSIS — S2231XD Fracture of one rib, right side, subsequent encounter for fracture with routine healing: Secondary | ICD-10-CM | POA: Diagnosis not present

## 2023-03-24 DIAGNOSIS — I10 Essential (primary) hypertension: Secondary | ICD-10-CM | POA: Diagnosis not present

## 2023-03-24 DIAGNOSIS — K219 Gastro-esophageal reflux disease without esophagitis: Secondary | ICD-10-CM | POA: Diagnosis not present

## 2023-03-24 DIAGNOSIS — F02B18 Dementia in other diseases classified elsewhere, moderate, with other behavioral disturbance: Secondary | ICD-10-CM | POA: Diagnosis not present

## 2023-03-24 DIAGNOSIS — G20C Parkinsonism, unspecified: Secondary | ICD-10-CM | POA: Diagnosis not present

## 2023-03-24 DIAGNOSIS — F5101 Primary insomnia: Secondary | ICD-10-CM | POA: Diagnosis not present

## 2023-03-24 DIAGNOSIS — G20A1 Parkinson's disease without dyskinesia, without mention of fluctuations: Secondary | ICD-10-CM | POA: Diagnosis not present

## 2023-03-24 DIAGNOSIS — S2241XD Multiple fractures of ribs, right side, subsequent encounter for fracture with routine healing: Secondary | ICD-10-CM | POA: Diagnosis not present

## 2023-04-01 DIAGNOSIS — K219 Gastro-esophageal reflux disease without esophagitis: Secondary | ICD-10-CM | POA: Diagnosis not present

## 2023-04-01 DIAGNOSIS — I10 Essential (primary) hypertension: Secondary | ICD-10-CM | POA: Diagnosis not present

## 2023-04-01 DIAGNOSIS — S2241XD Multiple fractures of ribs, right side, subsequent encounter for fracture with routine healing: Secondary | ICD-10-CM | POA: Diagnosis not present

## 2023-04-10 DIAGNOSIS — I639 Cerebral infarction, unspecified: Secondary | ICD-10-CM | POA: Diagnosis not present

## 2023-04-10 DIAGNOSIS — M6282 Rhabdomyolysis: Secondary | ICD-10-CM | POA: Diagnosis not present

## 2023-04-10 DIAGNOSIS — R269 Unspecified abnormalities of gait and mobility: Secondary | ICD-10-CM | POA: Diagnosis not present

## 2023-04-10 DIAGNOSIS — E43 Unspecified severe protein-calorie malnutrition: Secondary | ICD-10-CM | POA: Diagnosis not present

## 2023-04-16 DIAGNOSIS — I10 Essential (primary) hypertension: Secondary | ICD-10-CM | POA: Diagnosis not present

## 2023-04-16 DIAGNOSIS — K219 Gastro-esophageal reflux disease without esophagitis: Secondary | ICD-10-CM | POA: Diagnosis not present

## 2023-04-16 DIAGNOSIS — G20A1 Parkinson's disease without dyskinesia, without mention of fluctuations: Secondary | ICD-10-CM | POA: Diagnosis not present

## 2023-04-21 DIAGNOSIS — F5101 Primary insomnia: Secondary | ICD-10-CM | POA: Diagnosis not present

## 2023-04-21 DIAGNOSIS — F02B18 Dementia in other diseases classified elsewhere, moderate, with other behavioral disturbance: Secondary | ICD-10-CM | POA: Diagnosis not present

## 2023-04-21 DIAGNOSIS — G20C Parkinsonism, unspecified: Secondary | ICD-10-CM | POA: Diagnosis not present

## 2023-04-22 DIAGNOSIS — I10 Essential (primary) hypertension: Secondary | ICD-10-CM | POA: Diagnosis not present

## 2023-04-22 DIAGNOSIS — G20A1 Parkinson's disease without dyskinesia, without mention of fluctuations: Secondary | ICD-10-CM | POA: Diagnosis not present

## 2023-04-22 DIAGNOSIS — E440 Moderate protein-calorie malnutrition: Secondary | ICD-10-CM | POA: Diagnosis not present

## 2023-04-22 DIAGNOSIS — K219 Gastro-esophageal reflux disease without esophagitis: Secondary | ICD-10-CM | POA: Diagnosis not present

## 2023-04-25 DIAGNOSIS — I10 Essential (primary) hypertension: Secondary | ICD-10-CM | POA: Diagnosis not present

## 2023-04-25 DIAGNOSIS — E440 Moderate protein-calorie malnutrition: Secondary | ICD-10-CM | POA: Diagnosis not present

## 2023-04-25 DIAGNOSIS — G20A1 Parkinson's disease without dyskinesia, without mention of fluctuations: Secondary | ICD-10-CM | POA: Diagnosis not present

## 2023-04-25 DIAGNOSIS — K5909 Other constipation: Secondary | ICD-10-CM | POA: Diagnosis not present

## 2023-04-25 DIAGNOSIS — R296 Repeated falls: Secondary | ICD-10-CM | POA: Diagnosis not present

## 2023-04-25 DIAGNOSIS — K219 Gastro-esophageal reflux disease without esophagitis: Secondary | ICD-10-CM | POA: Diagnosis not present

## 2023-04-29 DIAGNOSIS — K219 Gastro-esophageal reflux disease without esophagitis: Secondary | ICD-10-CM | POA: Diagnosis not present

## 2023-04-29 DIAGNOSIS — G20A1 Parkinson's disease without dyskinesia, without mention of fluctuations: Secondary | ICD-10-CM | POA: Diagnosis not present

## 2023-04-29 DIAGNOSIS — E440 Moderate protein-calorie malnutrition: Secondary | ICD-10-CM | POA: Diagnosis not present

## 2023-05-02 DIAGNOSIS — K219 Gastro-esophageal reflux disease without esophagitis: Secondary | ICD-10-CM | POA: Diagnosis not present

## 2023-05-02 DIAGNOSIS — R296 Repeated falls: Secondary | ICD-10-CM | POA: Diagnosis not present

## 2023-05-02 DIAGNOSIS — K5909 Other constipation: Secondary | ICD-10-CM | POA: Diagnosis not present

## 2023-05-02 DIAGNOSIS — G20A1 Parkinson's disease without dyskinesia, without mention of fluctuations: Secondary | ICD-10-CM | POA: Diagnosis not present

## 2023-05-05 DIAGNOSIS — I10 Essential (primary) hypertension: Secondary | ICD-10-CM | POA: Diagnosis not present

## 2023-05-05 DIAGNOSIS — G20A1 Parkinson's disease without dyskinesia, without mention of fluctuations: Secondary | ICD-10-CM | POA: Diagnosis not present

## 2023-05-05 DIAGNOSIS — E440 Moderate protein-calorie malnutrition: Secondary | ICD-10-CM | POA: Diagnosis not present

## 2023-05-12 DIAGNOSIS — G20C Parkinsonism, unspecified: Secondary | ICD-10-CM | POA: Diagnosis not present

## 2023-05-12 DIAGNOSIS — F5101 Primary insomnia: Secondary | ICD-10-CM | POA: Diagnosis not present

## 2023-05-12 DIAGNOSIS — F02B18 Dementia in other diseases classified elsewhere, moderate, with other behavioral disturbance: Secondary | ICD-10-CM | POA: Diagnosis not present

## 2023-05-22 DIAGNOSIS — E43 Unspecified severe protein-calorie malnutrition: Secondary | ICD-10-CM | POA: Diagnosis not present

## 2023-05-22 DIAGNOSIS — R269 Unspecified abnormalities of gait and mobility: Secondary | ICD-10-CM | POA: Diagnosis not present

## 2023-05-22 DIAGNOSIS — I639 Cerebral infarction, unspecified: Secondary | ICD-10-CM | POA: Diagnosis not present

## 2023-05-22 DIAGNOSIS — M6282 Rhabdomyolysis: Secondary | ICD-10-CM | POA: Diagnosis not present

## 2023-05-27 DIAGNOSIS — K219 Gastro-esophageal reflux disease without esophagitis: Secondary | ICD-10-CM | POA: Diagnosis not present

## 2023-05-27 DIAGNOSIS — G20A1 Parkinson's disease without dyskinesia, without mention of fluctuations: Secondary | ICD-10-CM | POA: Diagnosis not present

## 2023-05-27 DIAGNOSIS — E440 Moderate protein-calorie malnutrition: Secondary | ICD-10-CM | POA: Diagnosis not present

## 2023-05-27 DIAGNOSIS — I10 Essential (primary) hypertension: Secondary | ICD-10-CM | POA: Diagnosis not present

## 2023-06-02 DIAGNOSIS — F02B18 Dementia in other diseases classified elsewhere, moderate, with other behavioral disturbance: Secondary | ICD-10-CM | POA: Diagnosis not present

## 2023-06-02 DIAGNOSIS — F5101 Primary insomnia: Secondary | ICD-10-CM | POA: Diagnosis not present

## 2023-06-02 DIAGNOSIS — G20C Parkinsonism, unspecified: Secondary | ICD-10-CM | POA: Diagnosis not present

## 2023-06-04 DIAGNOSIS — I10 Essential (primary) hypertension: Secondary | ICD-10-CM | POA: Diagnosis not present

## 2023-06-04 DIAGNOSIS — G20A1 Parkinson's disease without dyskinesia, without mention of fluctuations: Secondary | ICD-10-CM | POA: Diagnosis not present

## 2023-06-04 DIAGNOSIS — K219 Gastro-esophageal reflux disease without esophagitis: Secondary | ICD-10-CM | POA: Diagnosis not present

## 2023-06-06 DIAGNOSIS — K769 Liver disease, unspecified: Secondary | ICD-10-CM | POA: Diagnosis not present

## 2023-06-07 DIAGNOSIS — R296 Repeated falls: Secondary | ICD-10-CM | POA: Diagnosis not present

## 2023-06-07 DIAGNOSIS — E440 Moderate protein-calorie malnutrition: Secondary | ICD-10-CM | POA: Diagnosis not present

## 2023-06-07 DIAGNOSIS — K219 Gastro-esophageal reflux disease without esophagitis: Secondary | ICD-10-CM | POA: Diagnosis not present

## 2023-06-07 DIAGNOSIS — K5909 Other constipation: Secondary | ICD-10-CM | POA: Diagnosis not present

## 2023-06-07 DIAGNOSIS — G20A1 Parkinson's disease without dyskinesia, without mention of fluctuations: Secondary | ICD-10-CM | POA: Diagnosis not present

## 2023-06-09 DIAGNOSIS — I10 Essential (primary) hypertension: Secondary | ICD-10-CM | POA: Diagnosis not present

## 2023-06-09 DIAGNOSIS — R296 Repeated falls: Secondary | ICD-10-CM | POA: Diagnosis not present

## 2023-06-09 DIAGNOSIS — K219 Gastro-esophageal reflux disease without esophagitis: Secondary | ICD-10-CM | POA: Diagnosis not present

## 2023-06-10 IMAGING — DX DG CHEST 1V PORT
1 series · 1 of 1 positions shown · non-contrast
Comparison: 08/17/2019

CLINICAL DATA: Fall, shortness of breath.

EXAM:
PORTABLE CHEST 1 VIEW

[chest]
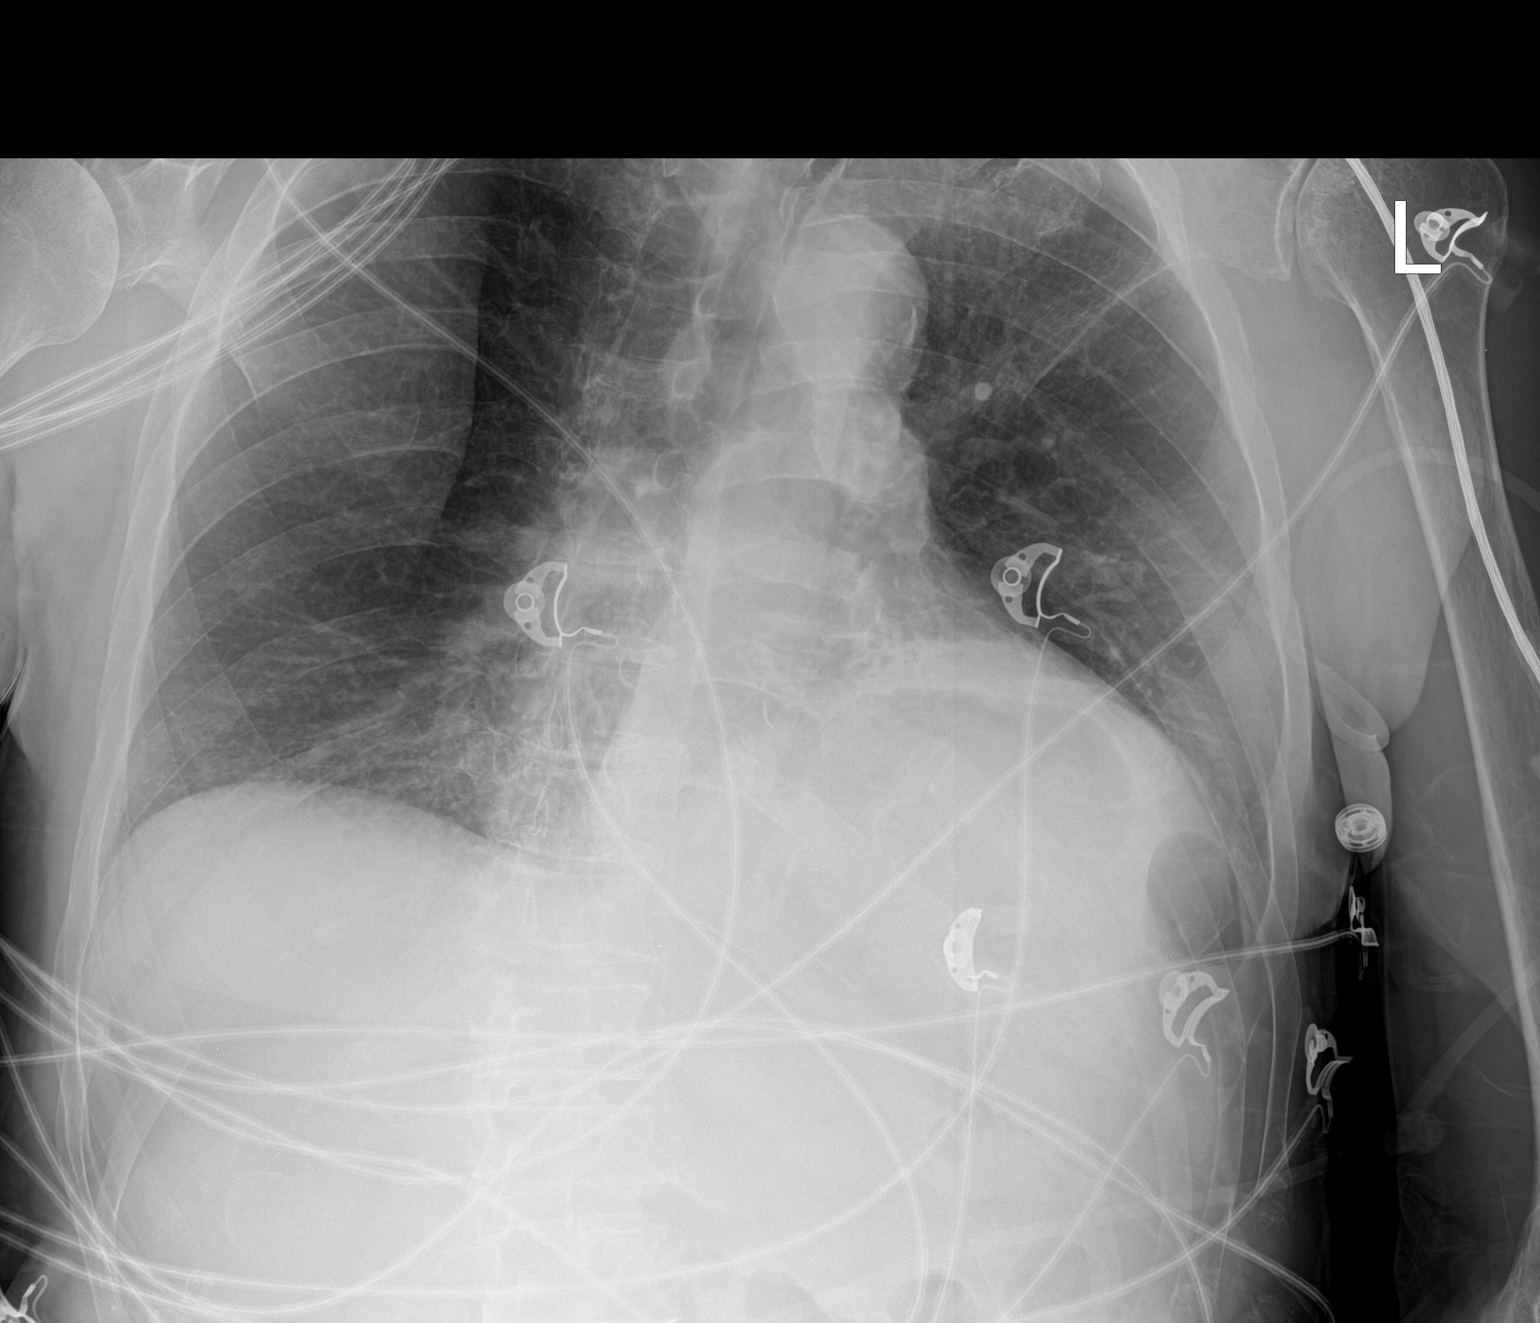

[1 of 1 positions shown; findings below may reference images not displayed]

FINDINGS: Chronic elevation of the left hemidiaphragm. Patient is rotated
towards the left. A few densities at the right lung base are
suggestive for atelectasis. Negative for pneumothorax.
Atherosclerotic calcifications at the aortic arch. Heart size is
grossly stable.
IMPRESSION: Limited examination due to patient rotation but there may be
atelectasis at the medial right lung base.

## 2023-06-10 IMAGING — CT CT HEAD W/O CM
4 series · 15 of 47 positions shown, 17 images · non-contrast
Comparison: CT head 08/17/2019

CLINICAL DATA: Head trauma.  Fall

EXAM:
CT HEAD WITHOUT CONTRAST
TECHNIQUE: Contiguous axial images were obtained from the base of the skull
through the vertex without intravenous contrast.

[Series 3: head without · axial · non-contrast · 0.51mm/px · z∈[-107,+18]mm · 7 of 35 slices shown, 9 images]
[im 5/35  brain]
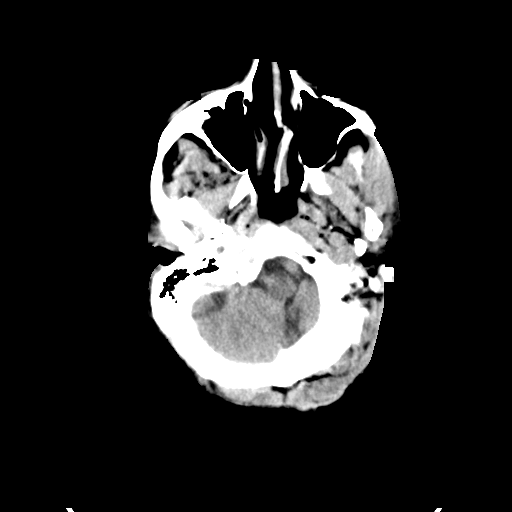
[im 5/35  bone]
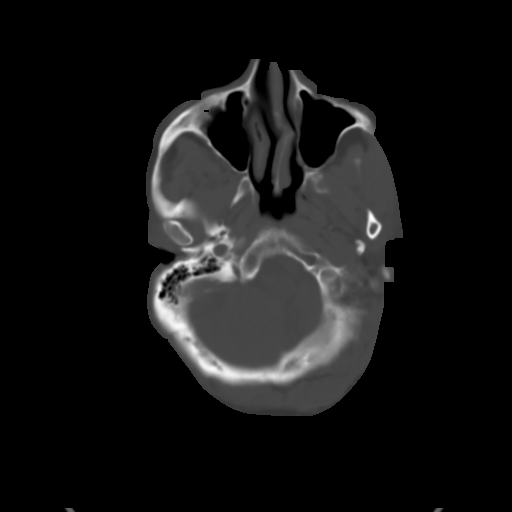
[im 9/35  brain]
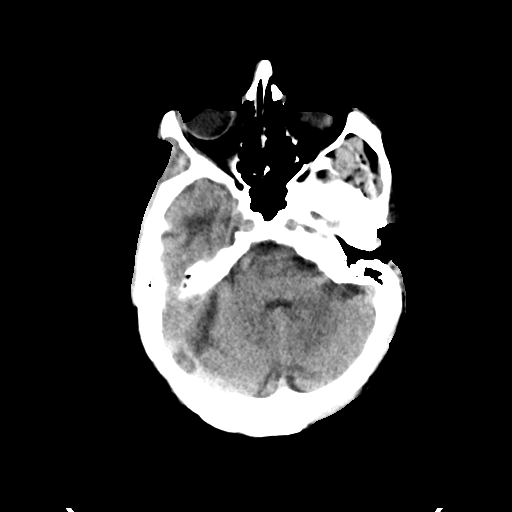
[im 13/35  brain]
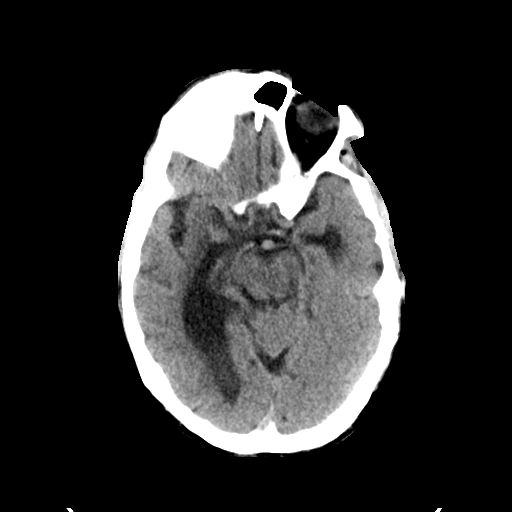
[im 18/35  brain]
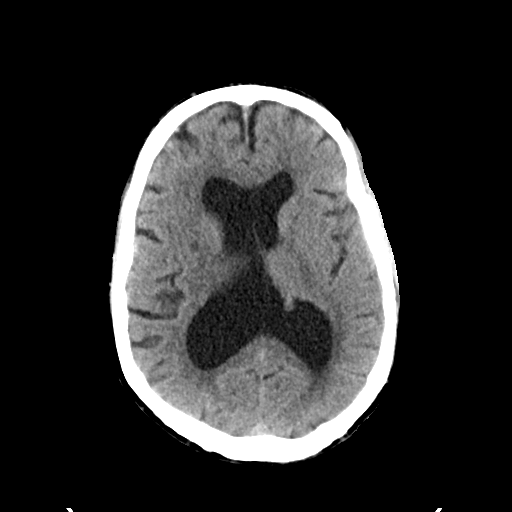
[im 22/35  brain]
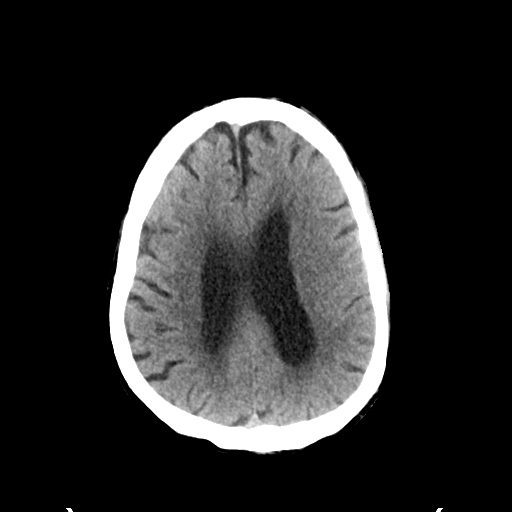
[im 22/35  bone]
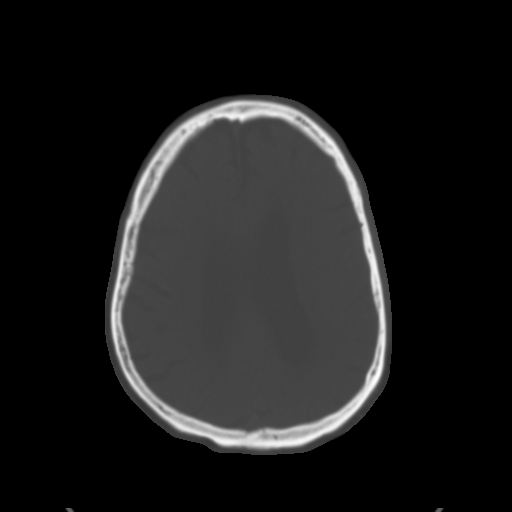
[im 26/35  brain]
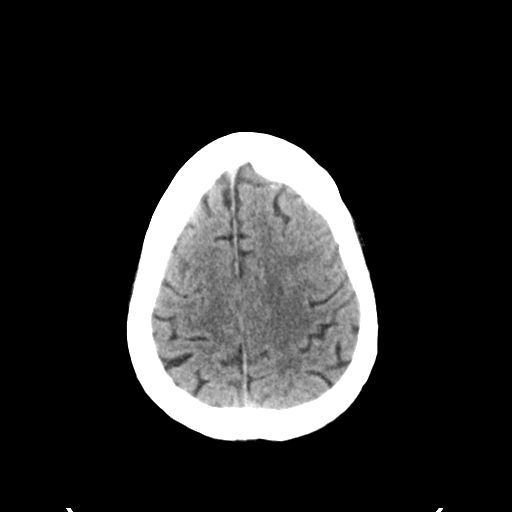
[im 30/35  brain]
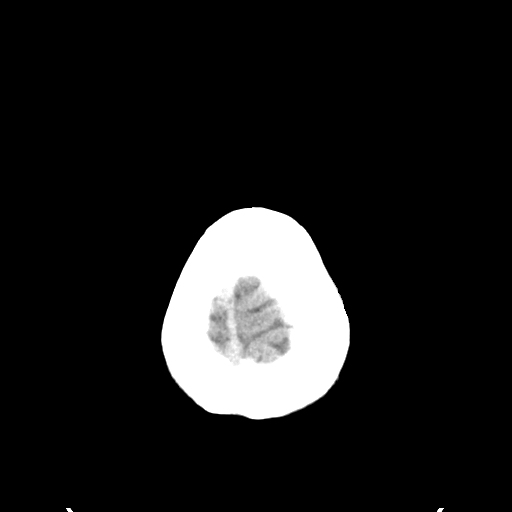

[Series 4: head bone · axial · 0.51mm/px · z∈[-111,-93]mm · 2 of 87 slices shown]
[im 9/87  bone]
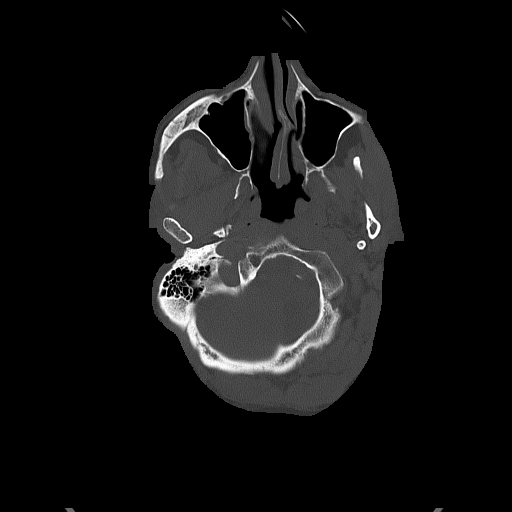
[im 18/87  bone]
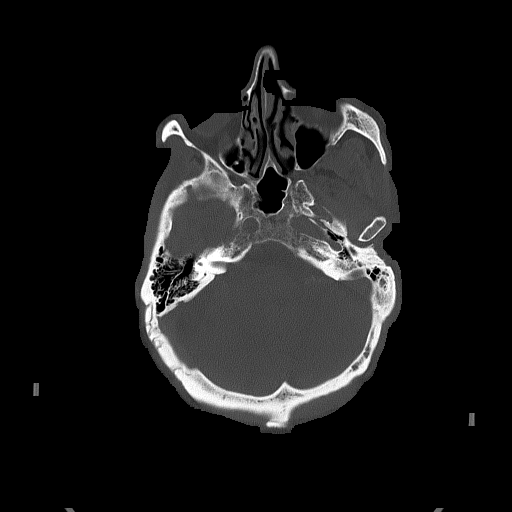

[Series 5: head without cor · coronal · non-contrast · 0.34mm/px · 3 of 71 slices shown]
[im 24/71  brain]
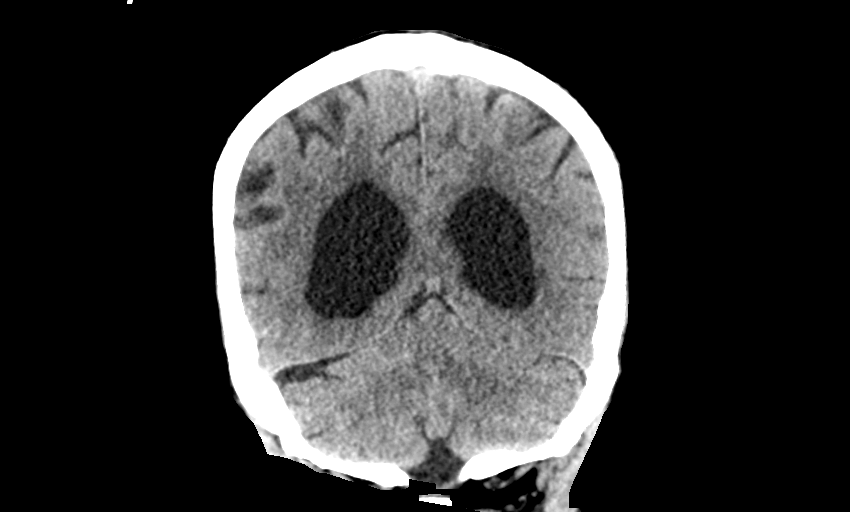
[im 32/71  brain]
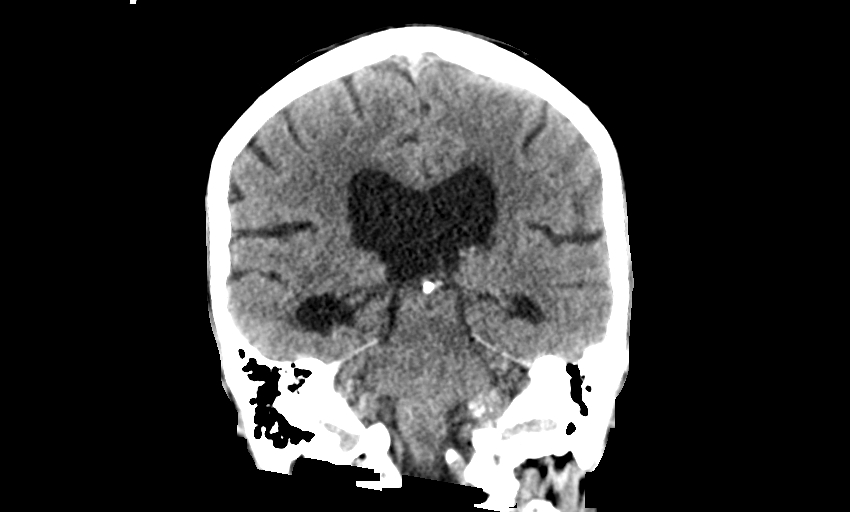
[im 39/71  brain]
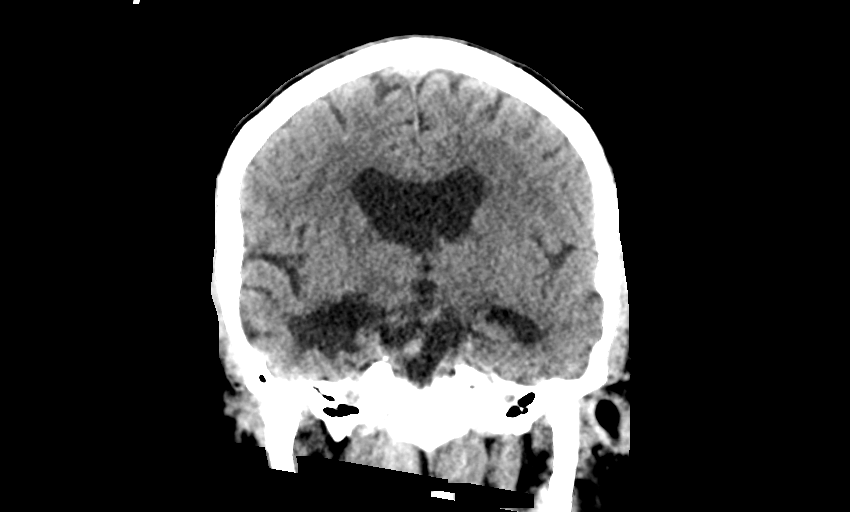

[Series 6: head without sag · sagittal · non-contrast · 0.34mm/px · 3 of 55 slices shown]
[im 21/55  brain]
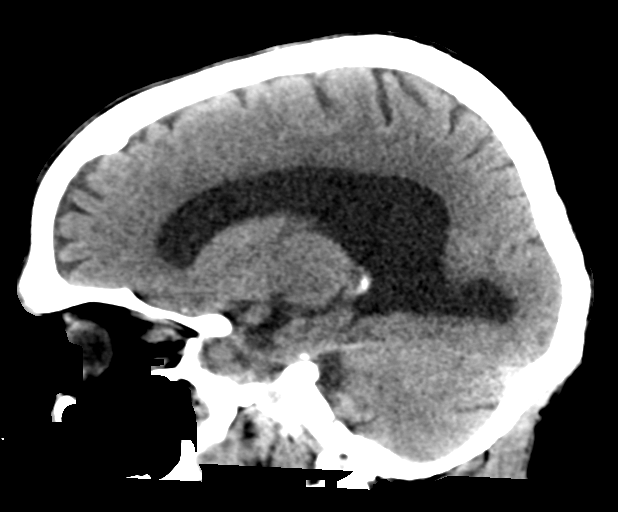
[im 28/55  brain]
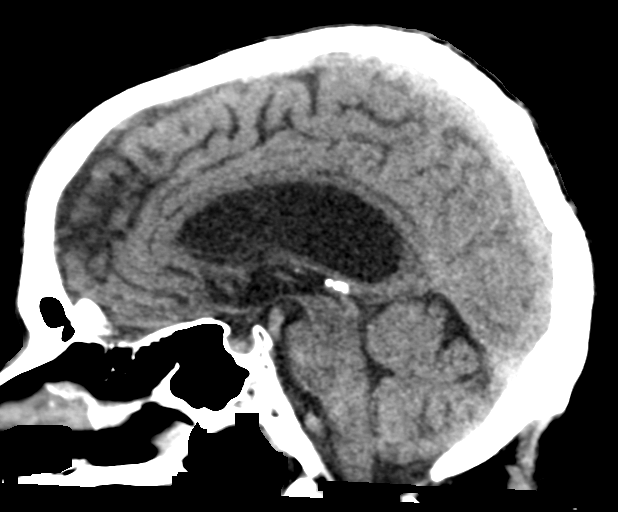
[im 34/55  brain]
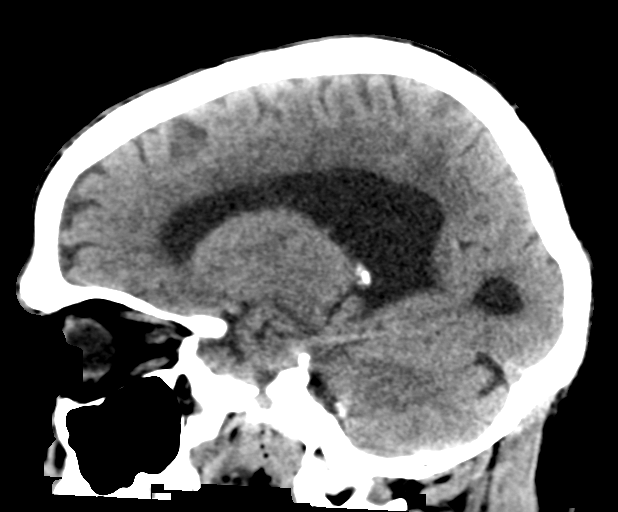

[15 of 47 positions shown; findings below may reference images not displayed]

FINDINGS: Brain: Mild ventricular dilatation similar to the prior study.
Encephalomalacia in the right temporal lobe unchanged. Patchy white
matter hypodensity bilaterally similar to the prior study.

Negative for acute infarct, hemorrhage, mass

Vascular: Negative for hyperdense vessel

Skull: Negative

Sinuses/Orbits: Negative

Other: None
IMPRESSION: No acute abnormality and no change from the prior CT.

## 2023-06-12 DIAGNOSIS — I639 Cerebral infarction, unspecified: Secondary | ICD-10-CM | POA: Diagnosis not present

## 2023-06-12 DIAGNOSIS — R269 Unspecified abnormalities of gait and mobility: Secondary | ICD-10-CM | POA: Diagnosis not present

## 2023-06-12 DIAGNOSIS — M6282 Rhabdomyolysis: Secondary | ICD-10-CM | POA: Diagnosis not present

## 2023-06-12 DIAGNOSIS — E43 Unspecified severe protein-calorie malnutrition: Secondary | ICD-10-CM | POA: Diagnosis not present

## 2023-06-13 DIAGNOSIS — G20A1 Parkinson's disease without dyskinesia, without mention of fluctuations: Secondary | ICD-10-CM | POA: Diagnosis not present

## 2023-06-13 DIAGNOSIS — K219 Gastro-esophageal reflux disease without esophagitis: Secondary | ICD-10-CM | POA: Diagnosis not present

## 2023-06-13 DIAGNOSIS — R296 Repeated falls: Secondary | ICD-10-CM | POA: Diagnosis not present

## 2023-06-13 DIAGNOSIS — K5909 Other constipation: Secondary | ICD-10-CM | POA: Diagnosis not present

## 2023-06-17 ENCOUNTER — Emergency Department (HOSPITAL_COMMUNITY)
Admission: EM | Admit: 2023-06-17 | Discharge: 2023-06-18 | Disposition: A | Discharge status: Home / Self Care | Payer: Medicare Other | Attending: Emergency Medicine | Admitting: Emergency Medicine

## 2023-06-17 ENCOUNTER — Other Ambulatory Visit: Payer: Self-pay

## 2023-06-17 ENCOUNTER — Encounter (HOSPITAL_COMMUNITY): Payer: Self-pay

## 2023-06-17 ENCOUNTER — Emergency Department (HOSPITAL_COMMUNITY): Payer: Medicare Other

## 2023-06-17 DIAGNOSIS — W19XXXA Unspecified fall, initial encounter: Secondary | ICD-10-CM | POA: Diagnosis not present

## 2023-06-17 DIAGNOSIS — G20C Parkinsonism, unspecified: Secondary | ICD-10-CM | POA: Insufficient documentation

## 2023-06-17 DIAGNOSIS — Z743 Need for continuous supervision: Secondary | ICD-10-CM | POA: Diagnosis not present

## 2023-06-17 DIAGNOSIS — S0101XA Laceration without foreign body of scalp, initial encounter: Secondary | ICD-10-CM | POA: Insufficient documentation

## 2023-06-17 DIAGNOSIS — R9082 White matter disease, unspecified: Secondary | ICD-10-CM | POA: Diagnosis not present

## 2023-06-17 DIAGNOSIS — I10 Essential (primary) hypertension: Secondary | ICD-10-CM | POA: Diagnosis not present

## 2023-06-17 DIAGNOSIS — W1830XA Fall on same level, unspecified, initial encounter: Secondary | ICD-10-CM | POA: Insufficient documentation

## 2023-06-17 DIAGNOSIS — S0990XA Unspecified injury of head, initial encounter: Secondary | ICD-10-CM | POA: Diagnosis not present

## 2023-06-17 DIAGNOSIS — G319 Degenerative disease of nervous system, unspecified: Secondary | ICD-10-CM | POA: Diagnosis not present

## 2023-06-17 DIAGNOSIS — S199XXA Unspecified injury of neck, initial encounter: Secondary | ICD-10-CM | POA: Diagnosis not present

## 2023-06-17 DIAGNOSIS — Z79899 Other long term (current) drug therapy: Secondary | ICD-10-CM | POA: Diagnosis not present

## 2023-06-17 DIAGNOSIS — R6889 Other general symptoms and signs: Secondary | ICD-10-CM | POA: Diagnosis not present

## 2023-06-17 DIAGNOSIS — R410 Disorientation, unspecified: Secondary | ICD-10-CM | POA: Diagnosis not present

## 2023-06-17 MED ORDER — LIDOCAINE-EPINEPHRINE (PF) 2 %-1:200000 IJ SOLN
10.0000 mL | Freq: Once | INTRAMUSCULAR | Status: DC
  Filled 2023-06-17: qty 20

## 2023-06-17 NOTE — ED Triage Notes (Addendum)
Pt BIB EMS from [place], for a unwitnessed fall, patient is nonambulatory but attempts to stand and walk from his wheel chair.  Laceration noted to the back of the head. Pt not on thinners, no LOC.  Pt confused at base line and end stage parkinson's  BP 106/51 Hr 60 16 Resp 97% RA 110 CBG

## 2023-06-17 NOTE — ED Provider Notes (Signed)
[place] EMERGENCY DEPARTMENT AT [facility] Provider Note   CSN: [id number] Arrival date & time: 06/17/23  1639   History  Chief Complaint  Patient presents with   Kevin Gould is a 77 y.o. male with a pertinent PMH of end-stage Parkinson's disease with dementia who arrived by EMS from [facility] following an unwitnessed fall.  History is limited by the patient's muffled speech and confusion.  Per EMS, he fell at [facility] and hit the back of his head with no loss of consciousness. He denies any headache or vision changes.  He is apparently confused at baseline and is oriented to self, but not place, time, or situation. Per chart review, he is not on any blood thinners.     Home Medications Prior to Admission medications   Medication Sig Start Date End Date Taking? Authorizing Provider  acetaminophen (TYLENOL) 500 MG tablet Take 500-1,000 mg by mouth See admin instructions. Take 1,000 mg by mouth two times a day and 500 mg every six hours as needed for pain    [provider]  carbidopa-levodopa (PARCOPA) 25-100 MG disintegrating tablet Take 2 tablets by mouth See admin instructions. Take 3 tablets by mouth twice daily for Parkinson at 9am and 1pm, then take 2 tablets by mouth at bedtime for parkinson    [provider]  Cholecalciferol (VITAMIN D3) 50 MCG (2000 UT) TABS Take 2,000 Units by mouth daily.    [provider]  escitalopram (LEXAPRO) 5 MG tablet Take 5 mg by mouth daily.    [provider]  famotidine (PEPCID) 20 MG tablet Take 20 mg by mouth 2 (two) times daily.    [provider]  feeding supplement, ENSURE ENLIVE, (ENSURE ENLIVE) LIQD Take 237 mLs by mouth 2 (two) times daily between meals. Patient not taking: Reported on 05/22/2021 08/22/19   Pennie Banter, DO  [employer] [facility]) OINT Apply 1 application topically See admin instructions. Apply to buttocks for redness    [provider]  lidocaine (LIDODERM) 5 % Place 1 patch onto the skin See admin instructions. Apply 1 patch to lower back once a day and remove & discard patch within 12 hours or as directed by MD    [provider]  Multiple Vitamin (MULTIVITAMIN WITH MINERALS) TABS tablet Take 1 tablet by mouth daily. 08/23/19   Pennie Banter, DO  senna (SENOKOT) 8.6 MG tablet Take 1 tablet by mouth daily.    [provider]      Allergies    Patient has no known allergies.    Review of Systems   Review of Systems  Constitutional: Negative.        ROS limited by dementia  HENT: Negative.    Eyes: Negative.   Respiratory: Negative.    Cardiovascular: Negative.   Gastrointestinal: Negative.   Endocrine: Negative.   Genitourinary: Negative.   Musculoskeletal: Negative.   Allergic/Immunologic: Negative.   Neurological: Negative.   Hematological: Negative.   Psychiatric/Behavioral:  Positive for confusion.     Physical Exam Updated Vital Signs BP 138/86 (BP Location: Right Arm)   Pulse (!) 58   Temp 97.7 F (36.5 C) (Oral)   Resp 16   SpO2 98%  Physical Exam HENT:     Head: Laceration present.   Eyes:     Extraocular Movements: Extraocular movements intact.     Pupils: Pupils are equal, round, and reactive to light.  Neck:  Comments: Cervical collar on Cardiovascular:     Rate and Rhythm: Normal rate and regular rhythm.     Pulses: Normal pulses.     Heart sounds: Normal heart sounds.  Pulmonary:     Effort: Pulmonary effort is normal.     Breath sounds: Normal breath sounds.  Abdominal:     Palpations: Abdomen is soft.     Tenderness: There is no abdominal tenderness.  Musculoskeletal:        General: Normal range of motion.     Comments: Muscle atrophy in LE bilaterally, likely from immobility  Skin:    Findings: Lesion present.     Comments: Small superficial laceration on back of head  Neurological:     General: No focal deficit present.      Mental Status: He is alert. Mental status is at baseline. He is disoriented.     Cranial Nerves: No cranial nerve deficit.     Sensory: No sensory deficit.     ED Results / Procedures / Treatments   Labs (all labs ordered are listed, but only abnormal results are displayed) Labs Reviewed - No data to display  EKG None  Radiology CT HEAD WO CONTRAST ( )  Result Date: 06/17/2023 CLINICAL DATA:  Abnormal mental status and neck trauma EXAM: CT HEAD WITHOUT CONTRAST CT CERVICAL SPINE WITHOUT CONTRAST TECHNIQUE: Multidetector CT imaging of the head and cervical spine was performed following the standard protocol without intravenous contrast. Multiplanar CT image reconstructions of the cervical spine were also generated. RADIATION DOSE REDUCTION: This exam was performed according to the departmental dose-optimization program which includes automated exposure control, adjustment of the mA and/or kV according to patient size and/or use of iterative reconstruction technique. COMPARISON:  CT cervical spine 04/12/2022 and CT head 04/12/2022 FINDINGS: CT HEAD FINDINGS Brain: No intracranial hemorrhage, mass effect, or evidence of acute infarct. Unchanged ventriculomegaly. No extra-axial fluid collection. Generalized cerebral atrophy. Ill-defined hypoattenuation within the cerebral white matter is nonspecific but consistent with chronic small vessel ischemic disease. Vascular: No hyperdense vessel. Intracranial arterial calcification. Skull: No fracture or focal lesion. Sinuses/Orbits: No acute finding. Frothy mucous in the sphenoid sinuses. Other: None. CT CERVICAL SPINE FINDINGS Alignment: No evidence of traumatic malalignment. Skull base and vertebrae: No acute fracture. No primary bone lesion or focal pathologic process. Soft tissues and spinal canal: No prevertebral fluid or swelling. No visible canal hematoma. Disc levels: Multilevel spondylosis, disc space height loss, and degenerative endplate changes  greatest at C4-C5, C5-C6, and C6-C7 where it is advanced. Multilevel advanced facet arthropathy. Upper chest: No acute abnormality. Other: Carotid calcification. IMPRESSION: 1. No acute intracranial abnormality. Generalized atrophy and small vessel white matter disease. 2. No acute fracture in the cervical spine. Multilevel degenerative spondylosis. 3. Sphenoid sinus disease. Electronically Signed   By: Minerva Fester M.D.   On: 06/17/2023 19:36   CT Cervical Spine Wo Contrast  Result Date: 06/17/2023 CLINICAL DATA:  Abnormal mental status and neck trauma EXAM: CT HEAD WITHOUT CONTRAST CT CERVICAL SPINE WITHOUT CONTRAST TECHNIQUE: Multidetector CT imaging of the head and cervical spine was performed following the standard protocol without intravenous contrast. Multiplanar CT image reconstructions of the cervical spine were also generated. RADIATION DOSE REDUCTION: This exam was performed according to the departmental dose-optimization program which includes automated exposure control, adjustment of the mA and/or kV according to patient size and/or use of iterative reconstruction technique. COMPARISON:  CT cervical spine 04/12/2022 and CT head 04/12/2022 FINDINGS: CT HEAD FINDINGS Brain: No intracranial hemorrhage,  mass effect, or evidence of acute infarct. Unchanged ventriculomegaly. No extra-axial fluid collection. Generalized cerebral atrophy. Ill-defined hypoattenuation within the cerebral white matter is nonspecific but consistent with chronic small vessel ischemic disease. Vascular: No hyperdense vessel. Intracranial arterial calcification. Skull: No fracture or focal lesion. Sinuses/Orbits: No acute finding. Frothy mucous in the sphenoid sinuses. Other: None. CT CERVICAL SPINE FINDINGS Alignment: No evidence of traumatic malalignment. Skull base and vertebrae: No acute fracture. No primary bone lesion or focal pathologic process. Soft tissues and spinal canal: No prevertebral fluid or swelling. No visible  canal hematoma. Disc levels: Multilevel spondylosis, disc space height loss, and degenerative endplate changes greatest at C4-C5, C5-C6, and C6-C7 where it is advanced. Multilevel advanced facet arthropathy. Upper chest: No acute abnormality. Other: Carotid calcification. IMPRESSION: 1. No acute intracranial abnormality. Generalized atrophy and small vessel white matter disease. 2. No acute fracture in the cervical spine. Multilevel degenerative spondylosis. 3. Sphenoid sinus disease. Electronically Signed   By: Minerva Fester M.D.   On: 06/17/2023 19:36    Medications Ordered in ED Medications - No data to display  ED Course/ Medical Decision Making/ A&P                                 Medical Decision Making Amount and/or Complexity of Data Reviewed Radiology: ordered.   This patient presents to the ED for concern of head injury after a fall. This involves an extensive number of treatment options, and is a complaint that carries with it a high risk of complications and morbidity.   Co morbidities: Parkinson's disease with dementia, frequent falls, HTN  Lab Tests:  No new labs.  Imaging Studies:  I ordered imaging studies including CT head and cervical spine, non-contrast  I independently visualized and interpreted imaging:  CT head non-contrast: No intracranial abnormalities  CT C-spine non-contrast: No fractures or acute abnormalities  I agree with the radiologist interpretation   MDM:   Patient is a 77 yr old male presenting to the ED after an unwitnessed fall at his skilled nursing facility.  He has a superficial laceration on the back of his head.  He is confused at baseline given his Parkinson's with dementia, so it is difficult to evaluate any change in mental status.  Physical exam was unremarkable with no other bodily injuries noted other than the superficial laceration on the back of the head. He denies any other symptoms including fever, dizziness, chills, cough,  nausea, vomiting, urinary changes.   On exam, his lungs are clear to auscultation bilaterally.  Heart regular rate and rhythm.  Abdomen is nontender.  Normal range of motion in upper and lower extremities and neck.  No other sources of MSK pain noted.  Pupils symmetric and reactive.  Denies any pain.  No focal neurological deficits are observed, however patient is confused and disoriented in the setting of his Parkinson's with dementia.  Since the patient says he feels well, no other injuries are observed, and the imaging is reassuring, the patient's laceration will be stapled and he can be discharged back to [facility] facility.   Annett Fabian, MD 06/17/23 2585    Alvira Monday, MD 06/18/23 1158

## 2023-06-18 DIAGNOSIS — G20A1 Parkinson's disease without dyskinesia, without mention of fluctuations: Secondary | ICD-10-CM | POA: Diagnosis not present

## 2023-06-18 DIAGNOSIS — I10 Essential (primary) hypertension: Secondary | ICD-10-CM | POA: Diagnosis not present

## 2023-06-18 DIAGNOSIS — R296 Repeated falls: Secondary | ICD-10-CM | POA: Diagnosis not present

## 2023-06-18 DIAGNOSIS — K219 Gastro-esophageal reflux disease without esophagitis: Secondary | ICD-10-CM | POA: Diagnosis not present

## 2023-06-18 NOTE — ED Provider Notes (Signed)
  Physical Exam  BP 124/87 (BP Location: Right Arm)   Pulse 60   Temp 97.9 F (36.6 C) (Oral)   Resp 16   SpO2 99%   Physical Exam  Procedures  .Marland KitchenLaceration Repair  Date/Time: 06/18/2023 11:55 AM  Performed by: Alvira Monday, MD Authorized by: Alvira Monday, MD   Consent:    Consent obtained:  Emergent situation and verbal   Consent given by:  Patient   Risks discussed:  Pain and infection   Alternatives discussed:  No treatment Universal protocol:    Patient identity confirmed:  Verbally with patient Anesthesia:    Anesthesia method:  Local infiltration   Local anesthetic:  Lidocaine 2% WITH epi Laceration details:    Length (cm):  4 Pre-procedure details:    Preparation:  Patient was prepped and draped in usual sterile fashion Treatment:    Area cleansed with:  Saline and chlorhexidine   Amount of cleaning:  Standard   Irrigation solution:  Sterile saline   Irrigation method:  Syringe Skin repair:    Repair method:  Staples   Number of staples:  6 Approximation:    Approximation:  Close Repair type:    Repair type:  Simple Post-procedure details:    Dressing:  Antibiotic ointment   Procedure completion:  Tolerated well, no immediate complications   ED Course / MDM    Medical Decision Making Amount and/or Complexity of Data Reviewed Radiology: ordered.   Repaired as above.       Alvira Monday, MD 06/18/23 1157

## 2023-06-23 DIAGNOSIS — E440 Moderate protein-calorie malnutrition: Secondary | ICD-10-CM | POA: Diagnosis not present

## 2023-06-23 DIAGNOSIS — I10 Essential (primary) hypertension: Secondary | ICD-10-CM | POA: Diagnosis not present

## 2023-06-23 DIAGNOSIS — K219 Gastro-esophageal reflux disease without esophagitis: Secondary | ICD-10-CM | POA: Diagnosis not present

## 2023-06-23 DIAGNOSIS — G20A1 Parkinson's disease without dyskinesia, without mention of fluctuations: Secondary | ICD-10-CM | POA: Diagnosis not present

## 2023-06-24 DIAGNOSIS — M6281 Muscle weakness (generalized): Secondary | ICD-10-CM | POA: Diagnosis not present

## 2023-06-24 DIAGNOSIS — G20A1 Parkinson's disease without dyskinesia, without mention of fluctuations: Secondary | ICD-10-CM | POA: Diagnosis not present

## 2023-06-27 DIAGNOSIS — G20A1 Parkinson's disease without dyskinesia, without mention of fluctuations: Secondary | ICD-10-CM | POA: Diagnosis not present

## 2023-06-27 DIAGNOSIS — M6281 Muscle weakness (generalized): Secondary | ICD-10-CM | POA: Diagnosis not present

## 2023-06-28 DIAGNOSIS — K219 Gastro-esophageal reflux disease without esophagitis: Secondary | ICD-10-CM | POA: Diagnosis not present

## 2023-06-28 DIAGNOSIS — I10 Essential (primary) hypertension: Secondary | ICD-10-CM | POA: Diagnosis not present

## 2023-06-28 DIAGNOSIS — E440 Moderate protein-calorie malnutrition: Secondary | ICD-10-CM | POA: Diagnosis not present

## 2023-06-28 DIAGNOSIS — K5909 Other constipation: Secondary | ICD-10-CM | POA: Diagnosis not present

## 2023-06-28 DIAGNOSIS — R296 Repeated falls: Secondary | ICD-10-CM | POA: Diagnosis not present

## 2023-06-28 DIAGNOSIS — G20A1 Parkinson's disease without dyskinesia, without mention of fluctuations: Secondary | ICD-10-CM | POA: Diagnosis not present

## 2023-06-29 DIAGNOSIS — M6281 Muscle weakness (generalized): Secondary | ICD-10-CM | POA: Diagnosis not present

## 2023-06-29 DIAGNOSIS — G20A1 Parkinson's disease without dyskinesia, without mention of fluctuations: Secondary | ICD-10-CM | POA: Diagnosis not present

## 2023-06-30 DIAGNOSIS — F5101 Primary insomnia: Secondary | ICD-10-CM | POA: Diagnosis not present

## 2023-06-30 DIAGNOSIS — L602 Onychogryphosis: Secondary | ICD-10-CM | POA: Diagnosis not present

## 2023-06-30 DIAGNOSIS — G20A1 Parkinson's disease without dyskinesia, without mention of fluctuations: Secondary | ICD-10-CM | POA: Diagnosis not present

## 2023-06-30 DIAGNOSIS — I739 Peripheral vascular disease, unspecified: Secondary | ICD-10-CM | POA: Diagnosis not present

## 2023-06-30 DIAGNOSIS — G20C Parkinsonism, unspecified: Secondary | ICD-10-CM | POA: Diagnosis not present

## 2023-06-30 DIAGNOSIS — M6281 Muscle weakness (generalized): Secondary | ICD-10-CM | POA: Diagnosis not present

## 2023-06-30 DIAGNOSIS — L603 Nail dystrophy: Secondary | ICD-10-CM | POA: Diagnosis not present

## 2023-07-02 DIAGNOSIS — E440 Moderate protein-calorie malnutrition: Secondary | ICD-10-CM | POA: Diagnosis not present

## 2023-07-02 DIAGNOSIS — G20A1 Parkinson's disease without dyskinesia, without mention of fluctuations: Secondary | ICD-10-CM | POA: Diagnosis not present

## 2023-07-02 DIAGNOSIS — M6281 Muscle weakness (generalized): Secondary | ICD-10-CM | POA: Diagnosis not present

## 2023-07-02 DIAGNOSIS — K219 Gastro-esophageal reflux disease without esophagitis: Secondary | ICD-10-CM | POA: Diagnosis not present

## 2023-07-02 DIAGNOSIS — I10 Essential (primary) hypertension: Secondary | ICD-10-CM | POA: Diagnosis not present

## 2023-07-03 DIAGNOSIS — M6281 Muscle weakness (generalized): Secondary | ICD-10-CM | POA: Diagnosis not present

## 2023-07-03 DIAGNOSIS — G20A1 Parkinson's disease without dyskinesia, without mention of fluctuations: Secondary | ICD-10-CM | POA: Diagnosis not present

## 2023-07-05 DIAGNOSIS — R296 Repeated falls: Secondary | ICD-10-CM | POA: Diagnosis not present

## 2023-07-05 DIAGNOSIS — E440 Moderate protein-calorie malnutrition: Secondary | ICD-10-CM | POA: Diagnosis not present

## 2023-07-05 DIAGNOSIS — K219 Gastro-esophageal reflux disease without esophagitis: Secondary | ICD-10-CM | POA: Diagnosis not present

## 2023-07-05 DIAGNOSIS — I10 Essential (primary) hypertension: Secondary | ICD-10-CM | POA: Diagnosis not present

## 2023-07-05 DIAGNOSIS — G20A1 Parkinson's disease without dyskinesia, without mention of fluctuations: Secondary | ICD-10-CM | POA: Diagnosis not present

## 2023-07-05 DIAGNOSIS — K5909 Other constipation: Secondary | ICD-10-CM | POA: Diagnosis not present

## 2023-07-07 DIAGNOSIS — M6281 Muscle weakness (generalized): Secondary | ICD-10-CM | POA: Diagnosis not present

## 2023-07-07 DIAGNOSIS — G20A1 Parkinson's disease without dyskinesia, without mention of fluctuations: Secondary | ICD-10-CM | POA: Diagnosis not present

## 2023-07-08 DIAGNOSIS — M6281 Muscle weakness (generalized): Secondary | ICD-10-CM | POA: Diagnosis not present

## 2023-07-08 DIAGNOSIS — G20A1 Parkinson's disease without dyskinesia, without mention of fluctuations: Secondary | ICD-10-CM | POA: Diagnosis not present

## 2023-07-09 DIAGNOSIS — G20A1 Parkinson's disease without dyskinesia, without mention of fluctuations: Secondary | ICD-10-CM | POA: Diagnosis not present

## 2023-07-09 DIAGNOSIS — M6281 Muscle weakness (generalized): Secondary | ICD-10-CM | POA: Diagnosis not present

## 2023-07-10 DIAGNOSIS — M6281 Muscle weakness (generalized): Secondary | ICD-10-CM | POA: Diagnosis not present

## 2023-07-10 DIAGNOSIS — R269 Unspecified abnormalities of gait and mobility: Secondary | ICD-10-CM | POA: Diagnosis not present

## 2023-07-10 DIAGNOSIS — G20A1 Parkinson's disease without dyskinesia, without mention of fluctuations: Secondary | ICD-10-CM | POA: Diagnosis not present

## 2023-07-10 DIAGNOSIS — M6282 Rhabdomyolysis: Secondary | ICD-10-CM | POA: Diagnosis not present

## 2023-07-10 DIAGNOSIS — E43 Unspecified severe protein-calorie malnutrition: Secondary | ICD-10-CM | POA: Diagnosis not present

## 2023-07-10 DIAGNOSIS — I639 Cerebral infarction, unspecified: Secondary | ICD-10-CM | POA: Diagnosis not present

## 2023-07-11 DIAGNOSIS — G20A1 Parkinson's disease without dyskinesia, without mention of fluctuations: Secondary | ICD-10-CM | POA: Diagnosis not present

## 2023-07-11 DIAGNOSIS — K5909 Other constipation: Secondary | ICD-10-CM | POA: Diagnosis not present

## 2023-07-11 DIAGNOSIS — K219 Gastro-esophageal reflux disease without esophagitis: Secondary | ICD-10-CM | POA: Diagnosis not present

## 2023-07-11 DIAGNOSIS — E440 Moderate protein-calorie malnutrition: Secondary | ICD-10-CM | POA: Diagnosis not present

## 2023-07-11 DIAGNOSIS — R296 Repeated falls: Secondary | ICD-10-CM | POA: Diagnosis not present

## 2023-07-11 DIAGNOSIS — I10 Essential (primary) hypertension: Secondary | ICD-10-CM | POA: Diagnosis not present

## 2023-07-13 DIAGNOSIS — M6281 Muscle weakness (generalized): Secondary | ICD-10-CM | POA: Diagnosis not present

## 2023-07-13 DIAGNOSIS — G20A1 Parkinson's disease without dyskinesia, without mention of fluctuations: Secondary | ICD-10-CM | POA: Diagnosis not present

## 2023-07-15 DIAGNOSIS — G20A1 Parkinson's disease without dyskinesia, without mention of fluctuations: Secondary | ICD-10-CM | POA: Diagnosis not present

## 2023-07-15 DIAGNOSIS — M6281 Muscle weakness (generalized): Secondary | ICD-10-CM | POA: Diagnosis not present

## 2023-07-17 DIAGNOSIS — G20A1 Parkinson's disease without dyskinesia, without mention of fluctuations: Secondary | ICD-10-CM | POA: Diagnosis not present

## 2023-07-17 DIAGNOSIS — M6281 Muscle weakness (generalized): Secondary | ICD-10-CM | POA: Diagnosis not present

## 2023-07-18 DIAGNOSIS — G20A1 Parkinson's disease without dyskinesia, without mention of fluctuations: Secondary | ICD-10-CM | POA: Diagnosis not present

## 2023-07-18 DIAGNOSIS — M6281 Muscle weakness (generalized): Secondary | ICD-10-CM | POA: Diagnosis not present

## 2023-07-20 DIAGNOSIS — G20A1 Parkinson's disease without dyskinesia, without mention of fluctuations: Secondary | ICD-10-CM | POA: Diagnosis not present

## 2023-07-20 DIAGNOSIS — I10 Essential (primary) hypertension: Secondary | ICD-10-CM | POA: Diagnosis not present

## 2023-07-20 DIAGNOSIS — R296 Repeated falls: Secondary | ICD-10-CM | POA: Diagnosis not present

## 2023-07-20 DIAGNOSIS — E440 Moderate protein-calorie malnutrition: Secondary | ICD-10-CM | POA: Diagnosis not present

## 2023-07-21 DIAGNOSIS — G20A1 Parkinson's disease without dyskinesia, without mention of fluctuations: Secondary | ICD-10-CM | POA: Diagnosis not present

## 2023-07-21 DIAGNOSIS — M6281 Muscle weakness (generalized): Secondary | ICD-10-CM | POA: Diagnosis not present

## 2023-07-22 DIAGNOSIS — G20A1 Parkinson's disease without dyskinesia, without mention of fluctuations: Secondary | ICD-10-CM | POA: Diagnosis not present

## 2023-07-22 DIAGNOSIS — M6281 Muscle weakness (generalized): Secondary | ICD-10-CM | POA: Diagnosis not present

## 2023-07-23 DIAGNOSIS — G20A1 Parkinson's disease without dyskinesia, without mention of fluctuations: Secondary | ICD-10-CM | POA: Diagnosis not present

## 2023-07-23 DIAGNOSIS — M6281 Muscle weakness (generalized): Secondary | ICD-10-CM | POA: Diagnosis not present

## 2023-07-24 DIAGNOSIS — G20A1 Parkinson's disease without dyskinesia, without mention of fluctuations: Secondary | ICD-10-CM | POA: Diagnosis not present

## 2023-07-24 DIAGNOSIS — M6281 Muscle weakness (generalized): Secondary | ICD-10-CM | POA: Diagnosis not present

## 2023-07-25 DIAGNOSIS — K219 Gastro-esophageal reflux disease without esophagitis: Secondary | ICD-10-CM | POA: Diagnosis not present

## 2023-07-25 DIAGNOSIS — I739 Peripheral vascular disease, unspecified: Secondary | ICD-10-CM | POA: Diagnosis not present

## 2023-07-25 DIAGNOSIS — M6281 Muscle weakness (generalized): Secondary | ICD-10-CM | POA: Diagnosis not present

## 2023-07-25 DIAGNOSIS — G20A1 Parkinson's disease without dyskinesia, without mention of fluctuations: Secondary | ICD-10-CM | POA: Diagnosis not present

## 2023-07-25 DIAGNOSIS — R296 Repeated falls: Secondary | ICD-10-CM | POA: Diagnosis not present

## 2023-07-25 DIAGNOSIS — I10 Essential (primary) hypertension: Secondary | ICD-10-CM | POA: Diagnosis not present

## 2023-07-25 DIAGNOSIS — K5909 Other constipation: Secondary | ICD-10-CM | POA: Diagnosis not present

## 2023-07-25 DIAGNOSIS — E7849 Other hyperlipidemia: Secondary | ICD-10-CM | POA: Diagnosis not present

## 2023-07-25 DIAGNOSIS — E538 Deficiency of other specified B group vitamins: Secondary | ICD-10-CM | POA: Diagnosis not present

## 2023-07-25 DIAGNOSIS — Z8616 Personal history of COVID-19: Secondary | ICD-10-CM | POA: Diagnosis not present

## 2023-07-25 DIAGNOSIS — E43 Unspecified severe protein-calorie malnutrition: Secondary | ICD-10-CM | POA: Diagnosis not present

## 2023-07-25 DIAGNOSIS — E440 Moderate protein-calorie malnutrition: Secondary | ICD-10-CM | POA: Diagnosis not present

## 2023-07-28 DIAGNOSIS — G20C Parkinsonism, unspecified: Secondary | ICD-10-CM | POA: Diagnosis not present

## 2023-07-28 DIAGNOSIS — G20A1 Parkinson's disease without dyskinesia, without mention of fluctuations: Secondary | ICD-10-CM | POA: Diagnosis not present

## 2023-07-28 DIAGNOSIS — F5101 Primary insomnia: Secondary | ICD-10-CM | POA: Diagnosis not present

## 2023-07-28 DIAGNOSIS — M6281 Muscle weakness (generalized): Secondary | ICD-10-CM | POA: Diagnosis not present

## 2023-07-29 DIAGNOSIS — M6281 Muscle weakness (generalized): Secondary | ICD-10-CM | POA: Diagnosis not present

## 2023-07-29 DIAGNOSIS — G20A1 Parkinson's disease without dyskinesia, without mention of fluctuations: Secondary | ICD-10-CM | POA: Diagnosis not present

## 2023-07-30 DIAGNOSIS — G20A1 Parkinson's disease without dyskinesia, without mention of fluctuations: Secondary | ICD-10-CM | POA: Diagnosis not present

## 2023-07-30 DIAGNOSIS — M6281 Muscle weakness (generalized): Secondary | ICD-10-CM | POA: Diagnosis not present

## 2023-07-31 DIAGNOSIS — I10 Essential (primary) hypertension: Secondary | ICD-10-CM | POA: Diagnosis not present

## 2023-07-31 DIAGNOSIS — E43 Unspecified severe protein-calorie malnutrition: Secondary | ICD-10-CM | POA: Diagnosis not present

## 2023-07-31 DIAGNOSIS — G20A1 Parkinson's disease without dyskinesia, without mention of fluctuations: Secondary | ICD-10-CM | POA: Diagnosis not present

## 2023-07-31 DIAGNOSIS — E7849 Other hyperlipidemia: Secondary | ICD-10-CM | POA: Diagnosis not present

## 2023-07-31 DIAGNOSIS — Z8616 Personal history of COVID-19: Secondary | ICD-10-CM | POA: Diagnosis not present

## 2023-07-31 DIAGNOSIS — I739 Peripheral vascular disease, unspecified: Secondary | ICD-10-CM | POA: Diagnosis not present

## 2023-07-31 DIAGNOSIS — K219 Gastro-esophageal reflux disease without esophagitis: Secondary | ICD-10-CM | POA: Diagnosis not present

## 2023-07-31 DIAGNOSIS — M6281 Muscle weakness (generalized): Secondary | ICD-10-CM | POA: Diagnosis not present

## 2023-07-31 DIAGNOSIS — E538 Deficiency of other specified B group vitamins: Secondary | ICD-10-CM | POA: Diagnosis not present

## 2023-08-03 DIAGNOSIS — I739 Peripheral vascular disease, unspecified: Secondary | ICD-10-CM | POA: Diagnosis not present

## 2023-08-03 DIAGNOSIS — E7849 Other hyperlipidemia: Secondary | ICD-10-CM | POA: Diagnosis not present

## 2023-08-03 DIAGNOSIS — E538 Deficiency of other specified B group vitamins: Secondary | ICD-10-CM | POA: Diagnosis not present

## 2023-08-03 DIAGNOSIS — I10 Essential (primary) hypertension: Secondary | ICD-10-CM | POA: Diagnosis not present

## 2023-08-03 DIAGNOSIS — G20A1 Parkinson's disease without dyskinesia, without mention of fluctuations: Secondary | ICD-10-CM | POA: Diagnosis not present

## 2023-08-03 DIAGNOSIS — M6281 Muscle weakness (generalized): Secondary | ICD-10-CM | POA: Diagnosis not present

## 2023-08-03 DIAGNOSIS — K219 Gastro-esophageal reflux disease without esophagitis: Secondary | ICD-10-CM | POA: Diagnosis not present

## 2023-08-03 DIAGNOSIS — E43 Unspecified severe protein-calorie malnutrition: Secondary | ICD-10-CM | POA: Diagnosis not present

## 2023-08-03 DIAGNOSIS — Z8616 Personal history of COVID-19: Secondary | ICD-10-CM | POA: Diagnosis not present

## 2023-08-04 DIAGNOSIS — M6281 Muscle weakness (generalized): Secondary | ICD-10-CM | POA: Diagnosis not present

## 2023-08-04 DIAGNOSIS — I739 Peripheral vascular disease, unspecified: Secondary | ICD-10-CM | POA: Diagnosis not present

## 2023-08-04 DIAGNOSIS — E538 Deficiency of other specified B group vitamins: Secondary | ICD-10-CM | POA: Diagnosis not present

## 2023-08-04 DIAGNOSIS — Z8616 Personal history of COVID-19: Secondary | ICD-10-CM | POA: Diagnosis not present

## 2023-08-04 DIAGNOSIS — G20A1 Parkinson's disease without dyskinesia, without mention of fluctuations: Secondary | ICD-10-CM | POA: Diagnosis not present

## 2023-08-04 DIAGNOSIS — E43 Unspecified severe protein-calorie malnutrition: Secondary | ICD-10-CM | POA: Diagnosis not present

## 2023-08-04 DIAGNOSIS — I10 Essential (primary) hypertension: Secondary | ICD-10-CM | POA: Diagnosis not present

## 2023-08-04 DIAGNOSIS — E7849 Other hyperlipidemia: Secondary | ICD-10-CM | POA: Diagnosis not present

## 2023-08-04 DIAGNOSIS — K219 Gastro-esophageal reflux disease without esophagitis: Secondary | ICD-10-CM | POA: Diagnosis not present

## 2023-08-06 DIAGNOSIS — G20A1 Parkinson's disease without dyskinesia, without mention of fluctuations: Secondary | ICD-10-CM | POA: Diagnosis not present

## 2023-08-06 DIAGNOSIS — M6281 Muscle weakness (generalized): Secondary | ICD-10-CM | POA: Diagnosis not present

## 2023-08-19 DIAGNOSIS — I739 Peripheral vascular disease, unspecified: Secondary | ICD-10-CM | POA: Diagnosis not present

## 2023-08-19 DIAGNOSIS — Z8616 Personal history of COVID-19: Secondary | ICD-10-CM | POA: Diagnosis not present

## 2023-08-19 DIAGNOSIS — G20A1 Parkinson's disease without dyskinesia, without mention of fluctuations: Secondary | ICD-10-CM | POA: Diagnosis not present

## 2023-08-19 DIAGNOSIS — E43 Unspecified severe protein-calorie malnutrition: Secondary | ICD-10-CM | POA: Diagnosis not present

## 2023-08-19 DIAGNOSIS — E538 Deficiency of other specified B group vitamins: Secondary | ICD-10-CM | POA: Diagnosis not present

## 2023-08-19 DIAGNOSIS — E7849 Other hyperlipidemia: Secondary | ICD-10-CM | POA: Diagnosis not present

## 2023-08-19 DIAGNOSIS — K219 Gastro-esophageal reflux disease without esophagitis: Secondary | ICD-10-CM | POA: Diagnosis not present

## 2023-08-19 DIAGNOSIS — M6281 Muscle weakness (generalized): Secondary | ICD-10-CM | POA: Diagnosis not present

## 2023-08-19 DIAGNOSIS — I10 Essential (primary) hypertension: Secondary | ICD-10-CM | POA: Diagnosis not present

## 2023-09-01 DIAGNOSIS — G20C Parkinsonism, unspecified: Secondary | ICD-10-CM | POA: Diagnosis not present

## 2023-09-08 DIAGNOSIS — M6281 Muscle weakness (generalized): Secondary | ICD-10-CM | POA: Diagnosis not present

## 2023-09-08 DIAGNOSIS — Z8616 Personal history of COVID-19: Secondary | ICD-10-CM | POA: Diagnosis not present

## 2023-09-08 DIAGNOSIS — G20A1 Parkinson's disease without dyskinesia, without mention of fluctuations: Secondary | ICD-10-CM | POA: Diagnosis not present

## 2023-09-08 DIAGNOSIS — I739 Peripheral vascular disease, unspecified: Secondary | ICD-10-CM | POA: Diagnosis not present

## 2023-09-08 DIAGNOSIS — E7849 Other hyperlipidemia: Secondary | ICD-10-CM | POA: Diagnosis not present

## 2023-09-08 DIAGNOSIS — K219 Gastro-esophageal reflux disease without esophagitis: Secondary | ICD-10-CM | POA: Diagnosis not present

## 2023-09-08 DIAGNOSIS — E538 Deficiency of other specified B group vitamins: Secondary | ICD-10-CM | POA: Diagnosis not present

## 2023-09-08 DIAGNOSIS — E43 Unspecified severe protein-calorie malnutrition: Secondary | ICD-10-CM | POA: Diagnosis not present

## 2023-09-08 DIAGNOSIS — I10 Essential (primary) hypertension: Secondary | ICD-10-CM | POA: Diagnosis not present

## 2023-09-12 ENCOUNTER — Emergency Department (HOSPITAL_COMMUNITY): Payer: Medicare Other

## 2023-09-12 ENCOUNTER — Emergency Department (HOSPITAL_COMMUNITY)
Admission: EM | Admit: 2023-09-12 | Discharge: 2023-09-13 | Disposition: A | Discharge status: Home / Self Care | Payer: Medicare Other | Attending: Emergency Medicine | Admitting: Emergency Medicine

## 2023-09-12 ENCOUNTER — Other Ambulatory Visit: Payer: Self-pay

## 2023-09-12 DIAGNOSIS — G20C Parkinsonism, unspecified: Secondary | ICD-10-CM | POA: Insufficient documentation

## 2023-09-12 DIAGNOSIS — G20A1 Parkinson's disease without dyskinesia, without mention of fluctuations: Secondary | ICD-10-CM

## 2023-09-12 DIAGNOSIS — R4182 Altered mental status, unspecified: Secondary | ICD-10-CM | POA: Insufficient documentation

## 2023-09-12 DIAGNOSIS — N390 Urinary tract infection, site not specified: Secondary | ICD-10-CM | POA: Insufficient documentation

## 2023-09-12 DIAGNOSIS — I10 Essential (primary) hypertension: Secondary | ICD-10-CM | POA: Diagnosis not present

## 2023-09-12 DIAGNOSIS — R404 Transient alteration of awareness: Secondary | ICD-10-CM | POA: Diagnosis not present

## 2023-09-12 DIAGNOSIS — R6889 Other general symptoms and signs: Secondary | ICD-10-CM | POA: Diagnosis not present

## 2023-09-12 DIAGNOSIS — Z743 Need for continuous supervision: Secondary | ICD-10-CM | POA: Diagnosis not present

## 2023-09-12 DIAGNOSIS — R61 Generalized hyperhidrosis: Secondary | ICD-10-CM | POA: Diagnosis not present

## 2023-09-12 LAB — COMPREHENSIVE METABOLIC PANEL
ALT: <5 U/L (ref 0–44)
AST: 16 U/L (ref 15–41)
Albumin: 3.1 g/dL — ABNORMAL LOW (ref 3.5–5.0)
Alkaline Phosphatase: 36 U/L — ABNORMAL LOW (ref 38–126)
Anion gap: 7 (ref 5–15)
BUN: 18 mg/dL (ref 8–23)
CO2: 21 mmol/L — ABNORMAL LOW (ref 22–32)
Calcium: 6.8 mg/dL — ABNORMAL LOW (ref 8.9–10.3)
Chloride: 110 mmol/L (ref 98–111)
Creatinine, Ser: 0.45 mg/dL — ABNORMAL LOW (ref 0.61–1.24)
GFR, Estimated: >60 mL/min (ref >60)
Glucose, Bld: 66 mg/dL — ABNORMAL LOW (ref 70–99)
Potassium: 2.9 mmol/L — ABNORMAL LOW (ref 3.5–5.1)
Sodium: 138 mmol/L (ref 135–145)
Total Bilirubin: 0.6 mg/dL (ref <1.2)
Total Protein: 5.4 g/dL — ABNORMAL LOW (ref 6.5–8.1)

## 2023-09-12 LAB — URINALYSIS, ROUTINE W REFLEX MICROSCOPIC
Bilirubin Urine: NEGATIVE
Glucose, UA: NEGATIVE mg/dL
Hgb urine dipstick: NEGATIVE
Ketones, ur: 5 mg/dL — AB
Nitrite: POSITIVE — AB
Protein, ur: NEGATIVE mg/dL
Specific Gravity, Urine: 1.026 (ref 1.005–1.030)
pH: 5 (ref 5.0–8.0)

## 2023-09-12 LAB — CBC
HCT: 39.3 % (ref 39.0–52.0)
Hemoglobin: 12.8 g/dL — ABNORMAL LOW (ref 13.0–17.0)
MCH: 32.5 pg (ref 26.0–34.0)
MCHC: 32.6 g/dL (ref 30.0–36.0)
MCV: 99.7 fL (ref 80.0–100.0)
Platelets: 150 10*3/uL (ref 150–400)
RBC: 3.94 MIL/uL — ABNORMAL LOW (ref 4.22–5.81)
RDW: 13.2 % (ref 11.5–15.5)
WBC: 5.8 10*3/uL (ref 4.0–10.5)
nRBC: 0 % (ref 0.0–0.2)

## 2023-09-12 LAB — CBG MONITORING, ED
Glucose-Capillary: 80 mg/dL (ref 70–99)
Glucose-Capillary: 84 mg/dL (ref 70–99)

## 2023-09-12 LAB — MAGNESIUM: Magnesium: 1.8 mg/dL (ref 1.7–2.4)

## 2023-09-12 MED ORDER — POTASSIUM CHLORIDE 20 MEQ PO PACK
40.0000 meq | PACK | Freq: Once | ORAL | Status: AC
  Administered 2023-09-12: 40 meq via ORAL
  Filled 2023-09-12: qty 2

## 2023-09-12 MED ORDER — SODIUM CHLORIDE 0.9 % IV SOLN
1.0000 g | Freq: Once | INTRAVENOUS | Status: AC
  Administered 2023-09-12: 1 g via INTRAVENOUS
  Filled 2023-09-12: qty 10

## 2023-09-12 NOTE — ED Provider Notes (Signed)
[place] EMERGENCY DEPARTMENT AT [facility] Provider Note   CSN: [id number] Arrival date & time: 09/12/23  1419     History  Chief Complaint  Patient presents with   Altered Mental Status    Kevin Gould is Gould 77 y.o. male.  Patient presents from SNF with altered mental status. No report of trauma/fall. EMS questioned if patient could have had sz, but no hx same, and no witnessed seizure activity. Pt v limited historian - level 5 caveat. No report of fevers. Pt does not make specific c/o or pain.   The history is provided by the patient, medical records and the EMS personnel. The history is limited by the condition of the patient.  Altered Mental Status Associated symptoms: no abdominal pain, no fever and no headaches        Home Medications Prior to Admission medications   Medication Sig Start Date End Date Taking? Authorizing Provider  acetaminophen (TYLENOL) 500 MG tablet Take 1,000 mg by mouth 2 (two) times daily.   Yes [provider]  carbidopa-levodopa (SINEMET IR) 25-100 MG tablet Take 2-3 tablets by mouth See admin instructions. Take 3 tablets by mouth at 8 AM & 12:30 PM and 2 tablets at 9 PM   Yes [provider]  Cholecalciferol (VITAMIN D3) 1000 units CAPS Take 2,000 Units by mouth in the morning.   Yes [provider]  famotidine (PEPCID) 20 MG tablet Take 20 mg by mouth in the morning and at bedtime.   Yes [provider]  Lidocaine HCl (LIDAFLEX) 4 % PTCH Apply 1-3 patches topically See admin instructions. Apply 1-3 patches to the lower back at 9 PM and remove at 9 AM daily   Yes [provider]  melatonin 3 MG TABS tablet Take 3 mg by mouth at bedtime.   Yes [provider]  NON FORMULARY Take 120 mLs by mouth See admin instructions. MED PASS Fortified Nutrition Shakes - Drink 120 ml's by mouth two times Gould day   Yes [provider]  senna (SENOKOT) 8.6 MG tablet Take 1 tablet by  mouth daily.   Yes [provider]  feeding supplement, ENSURE ENLIVE, (ENSURE ENLIVE) LIQD Take 237 mLs by mouth 2 (two) times daily between meals. Patient not taking: Reported on 09/12/2023 08/22/19   Kevin Grandchild A, DO      Allergies    Patient has no known allergies.    Review of Systems   Review of Systems  Constitutional:  Negative for fever.  Respiratory:  Negative for shortness of breath.   Cardiovascular:  Negative for chest pain.  Gastrointestinal:  Negative for abdominal pain.  Neurological:  Negative for headaches.    Physical Exam Updated Vital Signs BP (!) 125/97   Pulse 60   Temp (!) 97.5 F (36.4 C) (Axillary)   Resp 12   Ht 1.753 m (5\' 9" )   Wt 56.7 kg   SpO2 100%   BMI 18.46 kg/m  Physical Exam Vitals and nursing note reviewed.  Constitutional:      Appearance: Normal appearance. He is well-developed.  HENT:     Head: Atraumatic.     Nose: Nose normal.     Mouth/Throat:     Mouth: Mucous membranes are moist.  Eyes:     General: No scleral icterus.    Conjunctiva/sclera: Conjunctivae normal.     Pupils: Pupils are equal, round, and reactive to light.  Neck:     Vascular: No carotid  bruit.     Trachea: No tracheal deviation.     Comments: No stiffness or rigidity.  Cardiovascular:     Rate and Rhythm: Normal rate and regular rhythm.     Pulses: Normal pulses.     Heart sounds: Normal heart sounds. No murmur heard.    No friction rub. No gallop.  Pulmonary:     Effort: Pulmonary effort is normal. No accessory muscle usage or respiratory distress.     Breath sounds: Normal breath sounds.  Abdominal:     General: Bowel sounds are normal. There is no distension.     Palpations: Abdomen is soft.     Tenderness: There is no abdominal tenderness. There is no guarding.  Genitourinary:    Comments: No cva tenderness. Musculoskeletal:        General: No swelling.     Cervical back: Normal range of motion and neck supple. No rigidity or  tenderness.     Comments: CTLS spine, non tender, aligned, no step off. Good passive rom bil extremities without pain or focal bony tenderness. Distal pulses palp bil.   Skin:    General: Skin is warm and dry.     Findings: No rash.  Neurological:     Mental Status: He is alert.     Comments: Alert, speech clear. GCS 15. Motor/sens grossly intact bil.   Psychiatric:        Mood and Affect: Mood normal.     ED Results / Procedures / Treatments   Labs (all labs ordered are listed, but only abnormal results are displayed) Results for orders placed or performed during the hospital encounter of 09/12/23  Comprehensive metabolic panel  Result Value Ref Range   Sodium 138 135 - 145 mmol/L   Potassium 2.9 (L) 3.5 - 5.1 mmol/L   Chloride 110 98 - 111 mmol/L   CO2 21 (L) 22 - 32 mmol/L   Glucose, Bld 66 (L) 70 - 99 mg/dL   BUN 18 8 - 23 mg/dL   Creatinine, Ser 1.61 (L) 0.61 - 1.24 mg/dL   Calcium 6.8 (L) 8.9 - 10.3 mg/dL   Total Protein 5.4 (L) 6.5 - 8.1 g/dL   Albumin 3.1 (L) 3.5 - 5.0 g/dL   AST 16 15 - 41 U/L   ALT <5 0 - 44 U/L   Alkaline Phosphatase 36 (L) 38 - 126 U/L   Total Bilirubin 0.6 <1.2 mg/dL   GFR, Estimated >09 >60 mL/min   Anion gap 7 5 - 15  CBC  Result Value Ref Range   WBC 5.8 4.0 - 10.5 K/uL   RBC 3.94 (L) 4.22 - 5.81 MIL/uL   Hemoglobin 12.8 (L) 13.0 - 17.0 g/dL   HCT 45.4 09.8 - 11.9 %   MCV 99.7 80.0 - 100.0 fL   MCH 32.5 26.0 - 34.0 pg   MCHC 32.6 30.0 - 36.0 g/dL   RDW 14.7 82.9 - 56.2 %   Platelets 150 150 - 400 K/uL   nRBC 0.0 0.0 - 0.2 %  Urinalysis, Routine w reflex microscopic -Urine, Catheterized  Result Value Ref Range   Color, Urine YELLOW YELLOW   APPearance HAZY (Gould) CLEAR   Specific Gravity, Urine 1.026 1.005 - 1.030   pH 5.0 5.0 - 8.0   Glucose, UA NEGATIVE NEGATIVE mg/dL   Hgb urine dipstick NEGATIVE NEGATIVE   Bilirubin Urine NEGATIVE NEGATIVE   Ketones, ur 5 (Gould) NEGATIVE mg/dL   Protein, ur NEGATIVE NEGATIVE mg/dL   Nitrite  POSITIVE (Gould) NEGATIVE   Leukocytes,Ua SMALL (Gould) NEGATIVE   RBC / HPF 11-20 0 - 5 RBC/hpf   WBC, UA 21-50 0 - 5 WBC/hpf   Bacteria, UA RARE (Gould) NONE SEEN   Squamous Epithelial / HPF 0-5 0 - 5 /HPF   Mucus PRESENT    Hyaline Casts, UA PRESENT   Magnesium  Result Value Ref Range   Magnesium 1.8 1.7 - 2.4 mg/dL  CBG monitoring, ED  Result Value Ref Range   Glucose-Capillary 80 70 - 99 mg/dL  CBG monitoring, ED  Result Value Ref Range   Glucose-Capillary 84 70 - 99 mg/dL     EKG EKG Interpretation Date/Time:  Saturday September 12 2023 15:32:33 EST Ventricular Rate:  50 PR Interval:  134 QRS Duration:  101 QT Interval:  459 QTC Calculation: 419 R Axis:   -6  Text Interpretation: Sinus rhythm Non-specific ST-t changes Artifact Confirmed by Cathren Laine ([id number]) on 09/12/2023 3:50:27 PM  Radiology CT Head Wo Contrast  Result Date: 09/12/2023 CLINICAL DATA:  Mental status change, unknown cause EXAM: CT HEAD WITHOUT CONTRAST TECHNIQUE: Contiguous axial images were obtained from the base of the skull through the vertex without intravenous contrast. RADIATION DOSE REDUCTION: This exam was performed according to the departmental dose-optimization program which includes automated exposure control, adjustment of the mA and/or kV according to patient size and/or use of iterative reconstruction technique. COMPARISON:  CT head 06/17/2023. FINDINGS: Brain: No evidence of acute infarction, hemorrhage, hydrocephalus, extra-axial collection or mass lesion/mass effect. Similar ventriculomegaly. Vascular: Calcific atherosclerosis. Skull: No acute fracture. Sinuses/Orbits: Mostly clear sinuses.  No acute orbital findings. Other: No mastoid effusions. IMPRESSION: No evidence of acute intracranial abnormality. Electronically Signed   By: Feliberto Harts M.D.   On: 09/12/2023 15:30    Procedures Procedures    Medications Ordered in ED Medications  potassium chloride (KLOR-CON) packet 40 mEq (40 mEq  Oral Not Given 09/12/23 1817)  cefTRIAXone (ROCEPHIN) 1 g in sodium chloride 0.9 % 100 mL IVPB (1 g Intravenous New Bag/Given 09/12/23 1831)    ED Course/ Medical Decision Making/ Gould&P                                 Medical Decision Making Problems Addressed: Acute alteration in mental status: acute illness or injury with systemic symptoms that poses Gould threat to life or bodily functions Acute UTI: acute illness or injury that poses Gould threat to life or bodily functions Parkinson's disease, unspecified whether dyskinesia present, unspecified whether manifestations fluctuate (HCC): chronic illness or injury with exacerbation, progression, or side effects of treatment that poses Gould threat to life or bodily functions  Amount and/or Complexity of Data Reviewed Independent Historian: EMS    Details: hx External Data Reviewed: notes. Labs: ordered. Decision-making details documented in ED Course. Radiology: ordered and independent interpretation performed. Decision-making details documented in ED Course. ECG/medicine tests: ordered and independent interpretation performed. Decision-making details documented in ED Course.  Risk Prescription drug management. Decision regarding hospitalization.   Iv ns. Continuous pulse ox and cardiac monitoring. Labs ordered/sent. Imaging ordered.   Differential diagnosis includes head injury, dehydration, uti, etc. Dispo decision including potential need for admission considered - will get labs and imaging and reassess.   Reviewed nursing notes and prior charts for additional history. External reports reviewed. Additional history from: EMS. DNR/yellow ticket w paperwork.   Cardiac monitor: sinus rhythm, rate 70.  Labs reviewed/interpreted by me -  wbc and hct normal. K low. Mg added. Kcl po. Glucose 66 - po fluids/food.   CBG 84.  CT reviewed/interpreted by me - no hem.   Additional labs reviewed/interpreted by me - UA c/w uti, rocephin iv.   Recheck,  alert, content, no distress, vitals stable.   Pt currently appears stable for ED d/c.   Rec close pcp f/u.  Return precautions provided.            Final Clinical Impression(s) / ED Diagnoses Final diagnoses:  None    Rx / DC Orders ED Discharge Orders     None         Cathren Laine, MD 09/12/23 [phone]

## 2023-09-12 NOTE — ED Triage Notes (Signed)
Pt BIBA from [facility]. Facility staff told EMS that pt was found unresponsive in dining room. EMS concerned for seizure- no know hx. Pt had GCS of 12 that increased to 13.  Pt AOx3

## 2023-09-12 NOTE — ED Notes (Signed)
Called [facility] for pt. Transported back to [facility].

## 2023-09-12 NOTE — ED Notes (Signed)
Lab called to add on mag. 

## 2023-09-12 NOTE — ED Notes (Signed)
Second attempt to reach [facility] staff unsuccessful at this time

## 2023-09-12 NOTE — ED Notes (Signed)
Unable to reach [facility] x1.

## 2023-09-12 NOTE — Discharge Instructions (Signed)
It was our pleasure to provide your ER care today - we hope that you feel better.  You lab work shows a possible urine infection - take antibiotic as prescribed.   Follow up closely with primary care doctor in the coming week.  Return to ER if worse, new symptoms, fevers, trouble breathing, persistent vomiting, new/severe pain, or other concern.

## 2023-09-12 NOTE — ED Triage Notes (Signed)
[facility], Medic spoke to family about plans to DC at this time.

## 2023-09-13 DIAGNOSIS — R6889 Other general symptoms and signs: Secondary | ICD-10-CM | POA: Diagnosis not present

## 2023-09-13 DIAGNOSIS — Z7401 Bed confinement status: Secondary | ICD-10-CM | POA: Diagnosis not present

## 2023-09-13 NOTE — ED Notes (Signed)
Third attempt to reach [facility] unsuccessful

## 2023-09-13 NOTE — ED Notes (Signed)
Briefly spoke with front desk at [facility], but the line hung up when this RN told call was being transferred to RN on unit. Tried to call back; however, now no answer.

## 2023-09-14 DIAGNOSIS — Z8616 Personal history of COVID-19: Secondary | ICD-10-CM | POA: Diagnosis not present

## 2023-09-14 DIAGNOSIS — E538 Deficiency of other specified B group vitamins: Secondary | ICD-10-CM | POA: Diagnosis not present

## 2023-09-14 DIAGNOSIS — I10 Essential (primary) hypertension: Secondary | ICD-10-CM | POA: Diagnosis not present

## 2023-09-14 DIAGNOSIS — K219 Gastro-esophageal reflux disease without esophagitis: Secondary | ICD-10-CM | POA: Diagnosis not present

## 2023-09-14 DIAGNOSIS — G20A1 Parkinson's disease without dyskinesia, without mention of fluctuations: Secondary | ICD-10-CM | POA: Diagnosis not present

## 2023-09-14 DIAGNOSIS — M6281 Muscle weakness (generalized): Secondary | ICD-10-CM | POA: Diagnosis not present

## 2023-09-14 DIAGNOSIS — E43 Unspecified severe protein-calorie malnutrition: Secondary | ICD-10-CM | POA: Diagnosis not present

## 2023-09-14 DIAGNOSIS — E7849 Other hyperlipidemia: Secondary | ICD-10-CM | POA: Diagnosis not present

## 2023-09-14 DIAGNOSIS — I739 Peripheral vascular disease, unspecified: Secondary | ICD-10-CM | POA: Diagnosis not present

## 2023-09-15 DIAGNOSIS — I10 Essential (primary) hypertension: Secondary | ICD-10-CM | POA: Diagnosis not present

## 2023-09-16 DIAGNOSIS — N390 Urinary tract infection, site not specified: Secondary | ICD-10-CM | POA: Diagnosis not present

## 2023-09-17 DIAGNOSIS — I10 Essential (primary) hypertension: Secondary | ICD-10-CM | POA: Diagnosis not present

## 2023-09-17 DIAGNOSIS — I739 Peripheral vascular disease, unspecified: Secondary | ICD-10-CM | POA: Diagnosis not present

## 2023-09-17 DIAGNOSIS — M6281 Muscle weakness (generalized): Secondary | ICD-10-CM | POA: Diagnosis not present

## 2023-09-17 DIAGNOSIS — E7849 Other hyperlipidemia: Secondary | ICD-10-CM | POA: Diagnosis not present

## 2023-09-17 DIAGNOSIS — K219 Gastro-esophageal reflux disease without esophagitis: Secondary | ICD-10-CM | POA: Diagnosis not present

## 2023-09-17 DIAGNOSIS — G20A1 Parkinson's disease without dyskinesia, without mention of fluctuations: Secondary | ICD-10-CM | POA: Diagnosis not present

## 2023-09-17 DIAGNOSIS — Z8616 Personal history of COVID-19: Secondary | ICD-10-CM | POA: Diagnosis not present

## 2023-09-17 DIAGNOSIS — E43 Unspecified severe protein-calorie malnutrition: Secondary | ICD-10-CM | POA: Diagnosis not present

## 2023-09-17 DIAGNOSIS — E538 Deficiency of other specified B group vitamins: Secondary | ICD-10-CM | POA: Diagnosis not present

## 2023-09-19 DIAGNOSIS — E43 Unspecified severe protein-calorie malnutrition: Secondary | ICD-10-CM | POA: Diagnosis not present

## 2023-09-19 DIAGNOSIS — I739 Peripheral vascular disease, unspecified: Secondary | ICD-10-CM | POA: Diagnosis not present

## 2023-09-19 DIAGNOSIS — Z8616 Personal history of COVID-19: Secondary | ICD-10-CM | POA: Diagnosis not present

## 2023-09-19 DIAGNOSIS — G20A1 Parkinson's disease without dyskinesia, without mention of fluctuations: Secondary | ICD-10-CM | POA: Diagnosis not present

## 2023-09-19 DIAGNOSIS — K219 Gastro-esophageal reflux disease without esophagitis: Secondary | ICD-10-CM | POA: Diagnosis not present

## 2023-09-19 DIAGNOSIS — E538 Deficiency of other specified B group vitamins: Secondary | ICD-10-CM | POA: Diagnosis not present

## 2023-09-19 DIAGNOSIS — M6281 Muscle weakness (generalized): Secondary | ICD-10-CM | POA: Diagnosis not present

## 2023-09-19 DIAGNOSIS — E7849 Other hyperlipidemia: Secondary | ICD-10-CM | POA: Diagnosis not present

## 2023-09-19 DIAGNOSIS — I10 Essential (primary) hypertension: Secondary | ICD-10-CM | POA: Diagnosis not present

## 2023-10-05 DIAGNOSIS — I10 Essential (primary) hypertension: Secondary | ICD-10-CM | POA: Diagnosis not present

## 2023-10-06 DIAGNOSIS — E7849 Other hyperlipidemia: Secondary | ICD-10-CM | POA: Diagnosis not present

## 2023-10-06 DIAGNOSIS — I739 Peripheral vascular disease, unspecified: Secondary | ICD-10-CM | POA: Diagnosis not present

## 2023-10-06 DIAGNOSIS — I10 Essential (primary) hypertension: Secondary | ICD-10-CM | POA: Diagnosis not present

## 2023-10-06 DIAGNOSIS — E538 Deficiency of other specified B group vitamins: Secondary | ICD-10-CM | POA: Diagnosis not present

## 2023-10-06 DIAGNOSIS — Z8616 Personal history of COVID-19: Secondary | ICD-10-CM | POA: Diagnosis not present

## 2023-10-06 DIAGNOSIS — E43 Unspecified severe protein-calorie malnutrition: Secondary | ICD-10-CM | POA: Diagnosis not present

## 2023-10-06 DIAGNOSIS — G20A1 Parkinson's disease without dyskinesia, without mention of fluctuations: Secondary | ICD-10-CM | POA: Diagnosis not present

## 2023-10-06 DIAGNOSIS — K219 Gastro-esophageal reflux disease without esophagitis: Secondary | ICD-10-CM | POA: Diagnosis not present

## 2023-10-06 DIAGNOSIS — M6281 Muscle weakness (generalized): Secondary | ICD-10-CM | POA: Diagnosis not present

## 2023-10-06 DIAGNOSIS — G20C Parkinsonism, unspecified: Secondary | ICD-10-CM | POA: Diagnosis not present

## 2023-10-17 DIAGNOSIS — Z8616 Personal history of COVID-19: Secondary | ICD-10-CM | POA: Diagnosis not present

## 2023-10-17 DIAGNOSIS — I739 Peripheral vascular disease, unspecified: Secondary | ICD-10-CM | POA: Diagnosis not present

## 2023-10-17 DIAGNOSIS — M6281 Muscle weakness (generalized): Secondary | ICD-10-CM | POA: Diagnosis not present

## 2023-10-17 DIAGNOSIS — I10 Essential (primary) hypertension: Secondary | ICD-10-CM | POA: Diagnosis not present

## 2023-10-17 DIAGNOSIS — E43 Unspecified severe protein-calorie malnutrition: Secondary | ICD-10-CM | POA: Diagnosis not present

## 2023-10-17 DIAGNOSIS — G20A1 Parkinson's disease without dyskinesia, without mention of fluctuations: Secondary | ICD-10-CM | POA: Diagnosis not present

## 2023-10-17 DIAGNOSIS — E538 Deficiency of other specified B group vitamins: Secondary | ICD-10-CM | POA: Diagnosis not present

## 2023-10-17 DIAGNOSIS — K219 Gastro-esophageal reflux disease without esophagitis: Secondary | ICD-10-CM | POA: Diagnosis not present

## 2023-10-17 DIAGNOSIS — E7849 Other hyperlipidemia: Secondary | ICD-10-CM | POA: Diagnosis not present

## 2023-10-21 DIAGNOSIS — E7849 Other hyperlipidemia: Secondary | ICD-10-CM | POA: Diagnosis not present

## 2023-10-21 DIAGNOSIS — Z8616 Personal history of COVID-19: Secondary | ICD-10-CM | POA: Diagnosis not present

## 2023-10-21 DIAGNOSIS — E538 Deficiency of other specified B group vitamins: Secondary | ICD-10-CM | POA: Diagnosis not present

## 2023-10-21 DIAGNOSIS — M6281 Muscle weakness (generalized): Secondary | ICD-10-CM | POA: Diagnosis not present

## 2023-10-21 DIAGNOSIS — G20A1 Parkinson's disease without dyskinesia, without mention of fluctuations: Secondary | ICD-10-CM | POA: Diagnosis not present

## 2023-10-21 DIAGNOSIS — K219 Gastro-esophageal reflux disease without esophagitis: Secondary | ICD-10-CM | POA: Diagnosis not present

## 2023-10-21 DIAGNOSIS — E43 Unspecified severe protein-calorie malnutrition: Secondary | ICD-10-CM | POA: Diagnosis not present

## 2023-10-21 DIAGNOSIS — I739 Peripheral vascular disease, unspecified: Secondary | ICD-10-CM | POA: Diagnosis not present

## 2023-10-21 DIAGNOSIS — I10 Essential (primary) hypertension: Secondary | ICD-10-CM | POA: Diagnosis not present

## 2023-10-22 DIAGNOSIS — L603 Nail dystrophy: Secondary | ICD-10-CM | POA: Diagnosis not present

## 2023-10-22 DIAGNOSIS — I739 Peripheral vascular disease, unspecified: Secondary | ICD-10-CM | POA: Diagnosis not present

## 2023-10-22 DIAGNOSIS — L84 Corns and callosities: Secondary | ICD-10-CM | POA: Diagnosis not present

## 2023-10-22 DIAGNOSIS — L602 Onychogryphosis: Secondary | ICD-10-CM | POA: Diagnosis not present

## 2023-11-02 DIAGNOSIS — M6281 Muscle weakness (generalized): Secondary | ICD-10-CM | POA: Diagnosis not present

## 2023-11-02 DIAGNOSIS — E7849 Other hyperlipidemia: Secondary | ICD-10-CM | POA: Diagnosis not present

## 2023-11-02 DIAGNOSIS — K219 Gastro-esophageal reflux disease without esophagitis: Secondary | ICD-10-CM | POA: Diagnosis not present

## 2023-11-02 DIAGNOSIS — I739 Peripheral vascular disease, unspecified: Secondary | ICD-10-CM | POA: Diagnosis not present

## 2023-11-02 DIAGNOSIS — I10 Essential (primary) hypertension: Secondary | ICD-10-CM | POA: Diagnosis not present

## 2023-11-02 DIAGNOSIS — G20A1 Parkinson's disease without dyskinesia, without mention of fluctuations: Secondary | ICD-10-CM | POA: Diagnosis not present

## 2023-11-02 DIAGNOSIS — Z8616 Personal history of COVID-19: Secondary | ICD-10-CM | POA: Diagnosis not present

## 2023-11-02 DIAGNOSIS — E538 Deficiency of other specified B group vitamins: Secondary | ICD-10-CM | POA: Diagnosis not present

## 2023-11-02 DIAGNOSIS — E43 Unspecified severe protein-calorie malnutrition: Secondary | ICD-10-CM | POA: Diagnosis not present

## 2023-11-03 DIAGNOSIS — G20C Parkinsonism, unspecified: Secondary | ICD-10-CM | POA: Diagnosis not present

## 2023-11-14 DIAGNOSIS — I10 Essential (primary) hypertension: Secondary | ICD-10-CM | POA: Diagnosis not present

## 2023-11-14 DIAGNOSIS — E559 Vitamin D deficiency, unspecified: Secondary | ICD-10-CM | POA: Diagnosis not present

## 2023-11-16 DIAGNOSIS — E538 Deficiency of other specified B group vitamins: Secondary | ICD-10-CM | POA: Diagnosis not present

## 2023-11-16 DIAGNOSIS — E7849 Other hyperlipidemia: Secondary | ICD-10-CM | POA: Diagnosis not present

## 2023-11-16 DIAGNOSIS — I739 Peripheral vascular disease, unspecified: Secondary | ICD-10-CM | POA: Diagnosis not present

## 2023-11-16 DIAGNOSIS — M6281 Muscle weakness (generalized): Secondary | ICD-10-CM | POA: Diagnosis not present

## 2023-11-16 DIAGNOSIS — Z8616 Personal history of COVID-19: Secondary | ICD-10-CM | POA: Diagnosis not present

## 2023-11-16 DIAGNOSIS — K219 Gastro-esophageal reflux disease without esophagitis: Secondary | ICD-10-CM | POA: Diagnosis not present

## 2023-11-16 DIAGNOSIS — E43 Unspecified severe protein-calorie malnutrition: Secondary | ICD-10-CM | POA: Diagnosis not present

## 2023-11-16 DIAGNOSIS — G20A1 Parkinson's disease without dyskinesia, without mention of fluctuations: Secondary | ICD-10-CM | POA: Diagnosis not present

## 2023-11-16 DIAGNOSIS — I10 Essential (primary) hypertension: Secondary | ICD-10-CM | POA: Diagnosis not present

## 2023-11-17 DIAGNOSIS — G20A1 Parkinson's disease without dyskinesia, without mention of fluctuations: Secondary | ICD-10-CM | POA: Diagnosis not present

## 2023-11-17 DIAGNOSIS — Z8616 Personal history of COVID-19: Secondary | ICD-10-CM | POA: Diagnosis not present

## 2023-11-17 DIAGNOSIS — K219 Gastro-esophageal reflux disease without esophagitis: Secondary | ICD-10-CM | POA: Diagnosis not present

## 2023-11-17 DIAGNOSIS — E7849 Other hyperlipidemia: Secondary | ICD-10-CM | POA: Diagnosis not present

## 2023-11-17 DIAGNOSIS — I739 Peripheral vascular disease, unspecified: Secondary | ICD-10-CM | POA: Diagnosis not present

## 2023-11-17 DIAGNOSIS — I10 Essential (primary) hypertension: Secondary | ICD-10-CM | POA: Diagnosis not present

## 2023-11-17 DIAGNOSIS — M6281 Muscle weakness (generalized): Secondary | ICD-10-CM | POA: Diagnosis not present

## 2023-11-17 DIAGNOSIS — E43 Unspecified severe protein-calorie malnutrition: Secondary | ICD-10-CM | POA: Diagnosis not present

## 2023-11-17 DIAGNOSIS — E538 Deficiency of other specified B group vitamins: Secondary | ICD-10-CM | POA: Diagnosis not present

## 2023-11-23 DIAGNOSIS — Z8616 Personal history of COVID-19: Secondary | ICD-10-CM | POA: Diagnosis not present

## 2023-11-23 DIAGNOSIS — I739 Peripheral vascular disease, unspecified: Secondary | ICD-10-CM | POA: Diagnosis not present

## 2023-11-23 DIAGNOSIS — K219 Gastro-esophageal reflux disease without esophagitis: Secondary | ICD-10-CM | POA: Diagnosis not present

## 2023-11-23 DIAGNOSIS — M6281 Muscle weakness (generalized): Secondary | ICD-10-CM | POA: Diagnosis not present

## 2023-11-23 DIAGNOSIS — E538 Deficiency of other specified B group vitamins: Secondary | ICD-10-CM | POA: Diagnosis not present

## 2023-11-23 DIAGNOSIS — I10 Essential (primary) hypertension: Secondary | ICD-10-CM | POA: Diagnosis not present

## 2023-11-23 DIAGNOSIS — G20A1 Parkinson's disease without dyskinesia, without mention of fluctuations: Secondary | ICD-10-CM | POA: Diagnosis not present

## 2023-11-23 DIAGNOSIS — E7849 Other hyperlipidemia: Secondary | ICD-10-CM | POA: Diagnosis not present

## 2023-11-23 DIAGNOSIS — E43 Unspecified severe protein-calorie malnutrition: Secondary | ICD-10-CM | POA: Diagnosis not present

## 2023-11-27 DIAGNOSIS — E538 Deficiency of other specified B group vitamins: Secondary | ICD-10-CM | POA: Diagnosis not present

## 2023-11-27 DIAGNOSIS — I10 Essential (primary) hypertension: Secondary | ICD-10-CM | POA: Diagnosis not present

## 2023-11-27 DIAGNOSIS — E7849 Other hyperlipidemia: Secondary | ICD-10-CM | POA: Diagnosis not present

## 2023-11-27 DIAGNOSIS — M6281 Muscle weakness (generalized): Secondary | ICD-10-CM | POA: Diagnosis not present

## 2023-11-27 DIAGNOSIS — G20A1 Parkinson's disease without dyskinesia, without mention of fluctuations: Secondary | ICD-10-CM | POA: Diagnosis not present

## 2023-11-27 DIAGNOSIS — I739 Peripheral vascular disease, unspecified: Secondary | ICD-10-CM | POA: Diagnosis not present

## 2023-11-27 DIAGNOSIS — K219 Gastro-esophageal reflux disease without esophagitis: Secondary | ICD-10-CM | POA: Diagnosis not present

## 2023-11-27 DIAGNOSIS — E43 Unspecified severe protein-calorie malnutrition: Secondary | ICD-10-CM | POA: Diagnosis not present

## 2023-11-27 DIAGNOSIS — Z8616 Personal history of COVID-19: Secondary | ICD-10-CM | POA: Diagnosis not present

## 2023-11-30 DIAGNOSIS — R262 Difficulty in walking, not elsewhere classified: Secondary | ICD-10-CM | POA: Diagnosis not present

## 2023-11-30 DIAGNOSIS — G20A1 Parkinson's disease without dyskinesia, without mention of fluctuations: Secondary | ICD-10-CM | POA: Diagnosis not present

## 2023-12-01 DIAGNOSIS — G20C Parkinsonism, unspecified: Secondary | ICD-10-CM | POA: Diagnosis not present

## 2023-12-01 DIAGNOSIS — G20A1 Parkinson's disease without dyskinesia, without mention of fluctuations: Secondary | ICD-10-CM | POA: Diagnosis not present

## 2023-12-01 DIAGNOSIS — R262 Difficulty in walking, not elsewhere classified: Secondary | ICD-10-CM | POA: Diagnosis not present

## 2023-12-02 DIAGNOSIS — G20A1 Parkinson's disease without dyskinesia, without mention of fluctuations: Secondary | ICD-10-CM | POA: Diagnosis not present

## 2023-12-02 DIAGNOSIS — R262 Difficulty in walking, not elsewhere classified: Secondary | ICD-10-CM | POA: Diagnosis not present

## 2023-12-03 DIAGNOSIS — M6281 Muscle weakness (generalized): Secondary | ICD-10-CM | POA: Diagnosis not present

## 2023-12-03 DIAGNOSIS — E538 Deficiency of other specified B group vitamins: Secondary | ICD-10-CM | POA: Diagnosis not present

## 2023-12-03 DIAGNOSIS — E7849 Other hyperlipidemia: Secondary | ICD-10-CM | POA: Diagnosis not present

## 2023-12-03 DIAGNOSIS — G20A1 Parkinson's disease without dyskinesia, without mention of fluctuations: Secondary | ICD-10-CM | POA: Diagnosis not present

## 2023-12-03 DIAGNOSIS — Z8616 Personal history of COVID-19: Secondary | ICD-10-CM | POA: Diagnosis not present

## 2023-12-03 DIAGNOSIS — K219 Gastro-esophageal reflux disease without esophagitis: Secondary | ICD-10-CM | POA: Diagnosis not present

## 2023-12-03 DIAGNOSIS — I739 Peripheral vascular disease, unspecified: Secondary | ICD-10-CM | POA: Diagnosis not present

## 2023-12-03 DIAGNOSIS — I10 Essential (primary) hypertension: Secondary | ICD-10-CM | POA: Diagnosis not present

## 2023-12-03 DIAGNOSIS — R262 Difficulty in walking, not elsewhere classified: Secondary | ICD-10-CM | POA: Diagnosis not present

## 2023-12-03 DIAGNOSIS — E43 Unspecified severe protein-calorie malnutrition: Secondary | ICD-10-CM | POA: Diagnosis not present

## 2023-12-04 DIAGNOSIS — G20A1 Parkinson's disease without dyskinesia, without mention of fluctuations: Secondary | ICD-10-CM | POA: Diagnosis not present

## 2023-12-04 DIAGNOSIS — R262 Difficulty in walking, not elsewhere classified: Secondary | ICD-10-CM | POA: Diagnosis not present

## 2023-12-07 DIAGNOSIS — E43 Unspecified severe protein-calorie malnutrition: Secondary | ICD-10-CM | POA: Diagnosis not present

## 2023-12-07 DIAGNOSIS — I10 Essential (primary) hypertension: Secondary | ICD-10-CM | POA: Diagnosis not present

## 2023-12-07 DIAGNOSIS — I739 Peripheral vascular disease, unspecified: Secondary | ICD-10-CM | POA: Diagnosis not present

## 2023-12-07 DIAGNOSIS — R262 Difficulty in walking, not elsewhere classified: Secondary | ICD-10-CM | POA: Diagnosis not present

## 2023-12-07 DIAGNOSIS — G20A1 Parkinson's disease without dyskinesia, without mention of fluctuations: Secondary | ICD-10-CM | POA: Diagnosis not present

## 2023-12-07 DIAGNOSIS — E538 Deficiency of other specified B group vitamins: Secondary | ICD-10-CM | POA: Diagnosis not present

## 2023-12-07 DIAGNOSIS — M6281 Muscle weakness (generalized): Secondary | ICD-10-CM | POA: Diagnosis not present

## 2023-12-07 DIAGNOSIS — K219 Gastro-esophageal reflux disease without esophagitis: Secondary | ICD-10-CM | POA: Diagnosis not present

## 2023-12-07 DIAGNOSIS — Z8616 Personal history of COVID-19: Secondary | ICD-10-CM | POA: Diagnosis not present

## 2023-12-07 DIAGNOSIS — E7849 Other hyperlipidemia: Secondary | ICD-10-CM | POA: Diagnosis not present

## 2023-12-08 DIAGNOSIS — G20A1 Parkinson's disease without dyskinesia, without mention of fluctuations: Secondary | ICD-10-CM | POA: Diagnosis not present

## 2023-12-08 DIAGNOSIS — R262 Difficulty in walking, not elsewhere classified: Secondary | ICD-10-CM | POA: Diagnosis not present

## 2023-12-09 DIAGNOSIS — R262 Difficulty in walking, not elsewhere classified: Secondary | ICD-10-CM | POA: Diagnosis not present

## 2023-12-09 DIAGNOSIS — G20A1 Parkinson's disease without dyskinesia, without mention of fluctuations: Secondary | ICD-10-CM | POA: Diagnosis not present

## 2023-12-10 DIAGNOSIS — G20A1 Parkinson's disease without dyskinesia, without mention of fluctuations: Secondary | ICD-10-CM | POA: Diagnosis not present

## 2023-12-10 DIAGNOSIS — R262 Difficulty in walking, not elsewhere classified: Secondary | ICD-10-CM | POA: Diagnosis not present

## 2023-12-11 DIAGNOSIS — G20A1 Parkinson's disease without dyskinesia, without mention of fluctuations: Secondary | ICD-10-CM | POA: Diagnosis not present

## 2023-12-11 DIAGNOSIS — R262 Difficulty in walking, not elsewhere classified: Secondary | ICD-10-CM | POA: Diagnosis not present

## 2023-12-14 DIAGNOSIS — R262 Difficulty in walking, not elsewhere classified: Secondary | ICD-10-CM | POA: Diagnosis not present

## 2023-12-14 DIAGNOSIS — G20A1 Parkinson's disease without dyskinesia, without mention of fluctuations: Secondary | ICD-10-CM | POA: Diagnosis not present

## 2023-12-15 DIAGNOSIS — G20A1 Parkinson's disease without dyskinesia, without mention of fluctuations: Secondary | ICD-10-CM | POA: Diagnosis not present

## 2023-12-15 DIAGNOSIS — R262 Difficulty in walking, not elsewhere classified: Secondary | ICD-10-CM | POA: Diagnosis not present

## 2023-12-17 DIAGNOSIS — R262 Difficulty in walking, not elsewhere classified: Secondary | ICD-10-CM | POA: Diagnosis not present

## 2023-12-17 DIAGNOSIS — G20A1 Parkinson's disease without dyskinesia, without mention of fluctuations: Secondary | ICD-10-CM | POA: Diagnosis not present

## 2023-12-21 DIAGNOSIS — G20A1 Parkinson's disease without dyskinesia, without mention of fluctuations: Secondary | ICD-10-CM | POA: Diagnosis not present

## 2023-12-21 DIAGNOSIS — R262 Difficulty in walking, not elsewhere classified: Secondary | ICD-10-CM | POA: Diagnosis not present

## 2023-12-22 DIAGNOSIS — R262 Difficulty in walking, not elsewhere classified: Secondary | ICD-10-CM | POA: Diagnosis not present

## 2023-12-22 DIAGNOSIS — G20A1 Parkinson's disease without dyskinesia, without mention of fluctuations: Secondary | ICD-10-CM | POA: Diagnosis not present

## 2023-12-24 DIAGNOSIS — G20A1 Parkinson's disease without dyskinesia, without mention of fluctuations: Secondary | ICD-10-CM | POA: Diagnosis not present

## 2023-12-24 DIAGNOSIS — K219 Gastro-esophageal reflux disease without esophagitis: Secondary | ICD-10-CM | POA: Diagnosis not present

## 2023-12-24 DIAGNOSIS — E43 Unspecified severe protein-calorie malnutrition: Secondary | ICD-10-CM | POA: Diagnosis not present

## 2023-12-24 DIAGNOSIS — Z8616 Personal history of COVID-19: Secondary | ICD-10-CM | POA: Diagnosis not present

## 2023-12-24 DIAGNOSIS — M6281 Muscle weakness (generalized): Secondary | ICD-10-CM | POA: Diagnosis not present

## 2023-12-24 DIAGNOSIS — I10 Essential (primary) hypertension: Secondary | ICD-10-CM | POA: Diagnosis not present

## 2023-12-24 DIAGNOSIS — I739 Peripheral vascular disease, unspecified: Secondary | ICD-10-CM | POA: Diagnosis not present

## 2023-12-24 DIAGNOSIS — E538 Deficiency of other specified B group vitamins: Secondary | ICD-10-CM | POA: Diagnosis not present

## 2023-12-24 DIAGNOSIS — E7849 Other hyperlipidemia: Secondary | ICD-10-CM | POA: Diagnosis not present

## 2023-12-25 DIAGNOSIS — I739 Peripheral vascular disease, unspecified: Secondary | ICD-10-CM | POA: Diagnosis not present

## 2023-12-25 DIAGNOSIS — E7849 Other hyperlipidemia: Secondary | ICD-10-CM | POA: Diagnosis not present

## 2023-12-25 DIAGNOSIS — M6281 Muscle weakness (generalized): Secondary | ICD-10-CM | POA: Diagnosis not present

## 2023-12-25 DIAGNOSIS — G20A1 Parkinson's disease without dyskinesia, without mention of fluctuations: Secondary | ICD-10-CM | POA: Diagnosis not present

## 2023-12-25 DIAGNOSIS — I10 Essential (primary) hypertension: Secondary | ICD-10-CM | POA: Diagnosis not present

## 2023-12-25 DIAGNOSIS — K219 Gastro-esophageal reflux disease without esophagitis: Secondary | ICD-10-CM | POA: Diagnosis not present

## 2023-12-25 DIAGNOSIS — Z8616 Personal history of COVID-19: Secondary | ICD-10-CM | POA: Diagnosis not present

## 2023-12-25 DIAGNOSIS — E43 Unspecified severe protein-calorie malnutrition: Secondary | ICD-10-CM | POA: Diagnosis not present

## 2023-12-25 DIAGNOSIS — E538 Deficiency of other specified B group vitamins: Secondary | ICD-10-CM | POA: Diagnosis not present

## 2023-12-31 DIAGNOSIS — E43 Unspecified severe protein-calorie malnutrition: Secondary | ICD-10-CM | POA: Diagnosis not present

## 2023-12-31 DIAGNOSIS — E7849 Other hyperlipidemia: Secondary | ICD-10-CM | POA: Diagnosis not present

## 2023-12-31 DIAGNOSIS — G20A1 Parkinson's disease without dyskinesia, without mention of fluctuations: Secondary | ICD-10-CM | POA: Diagnosis not present

## 2023-12-31 DIAGNOSIS — Z8616 Personal history of COVID-19: Secondary | ICD-10-CM | POA: Diagnosis not present

## 2023-12-31 DIAGNOSIS — E538 Deficiency of other specified B group vitamins: Secondary | ICD-10-CM | POA: Diagnosis not present

## 2023-12-31 DIAGNOSIS — M6281 Muscle weakness (generalized): Secondary | ICD-10-CM | POA: Diagnosis not present

## 2023-12-31 DIAGNOSIS — K219 Gastro-esophageal reflux disease without esophagitis: Secondary | ICD-10-CM | POA: Diagnosis not present

## 2023-12-31 DIAGNOSIS — I739 Peripheral vascular disease, unspecified: Secondary | ICD-10-CM | POA: Diagnosis not present

## 2023-12-31 DIAGNOSIS — I10 Essential (primary) hypertension: Secondary | ICD-10-CM | POA: Diagnosis not present

## 2024-01-07 DIAGNOSIS — E43 Unspecified severe protein-calorie malnutrition: Secondary | ICD-10-CM | POA: Diagnosis not present

## 2024-01-07 DIAGNOSIS — I10 Essential (primary) hypertension: Secondary | ICD-10-CM | POA: Diagnosis not present

## 2024-01-07 DIAGNOSIS — G20A1 Parkinson's disease without dyskinesia, without mention of fluctuations: Secondary | ICD-10-CM | POA: Diagnosis not present

## 2024-01-07 DIAGNOSIS — Z8616 Personal history of COVID-19: Secondary | ICD-10-CM | POA: Diagnosis not present

## 2024-01-07 DIAGNOSIS — K219 Gastro-esophageal reflux disease without esophagitis: Secondary | ICD-10-CM | POA: Diagnosis not present

## 2024-01-07 DIAGNOSIS — E538 Deficiency of other specified B group vitamins: Secondary | ICD-10-CM | POA: Diagnosis not present

## 2024-01-07 DIAGNOSIS — M6281 Muscle weakness (generalized): Secondary | ICD-10-CM | POA: Diagnosis not present

## 2024-01-07 DIAGNOSIS — E7849 Other hyperlipidemia: Secondary | ICD-10-CM | POA: Diagnosis not present

## 2024-01-07 DIAGNOSIS — I739 Peripheral vascular disease, unspecified: Secondary | ICD-10-CM | POA: Diagnosis not present

## 2024-01-24 DIAGNOSIS — F5101 Primary insomnia: Secondary | ICD-10-CM | POA: Diagnosis not present

## 2024-01-24 DIAGNOSIS — G20C Parkinsonism, unspecified: Secondary | ICD-10-CM | POA: Diagnosis not present

## 2024-02-04 DIAGNOSIS — E43 Unspecified severe protein-calorie malnutrition: Secondary | ICD-10-CM | POA: Diagnosis not present

## 2024-02-04 DIAGNOSIS — I739 Peripheral vascular disease, unspecified: Secondary | ICD-10-CM | POA: Diagnosis not present

## 2024-02-04 DIAGNOSIS — M6281 Muscle weakness (generalized): Secondary | ICD-10-CM | POA: Diagnosis not present

## 2024-02-04 DIAGNOSIS — G20A1 Parkinson's disease without dyskinesia, without mention of fluctuations: Secondary | ICD-10-CM | POA: Diagnosis not present

## 2024-02-04 DIAGNOSIS — E7849 Other hyperlipidemia: Secondary | ICD-10-CM | POA: Diagnosis not present

## 2024-02-04 DIAGNOSIS — R262 Difficulty in walking, not elsewhere classified: Secondary | ICD-10-CM | POA: Diagnosis not present

## 2024-02-04 DIAGNOSIS — K219 Gastro-esophageal reflux disease without esophagitis: Secondary | ICD-10-CM | POA: Diagnosis not present

## 2024-02-04 DIAGNOSIS — Z8616 Personal history of COVID-19: Secondary | ICD-10-CM | POA: Diagnosis not present

## 2024-02-13 DIAGNOSIS — R55 Syncope and collapse: Secondary | ICD-10-CM | POA: Diagnosis not present

## 2024-02-17 DIAGNOSIS — E7849 Other hyperlipidemia: Secondary | ICD-10-CM | POA: Diagnosis not present

## 2024-02-17 DIAGNOSIS — G20A1 Parkinson's disease without dyskinesia, without mention of fluctuations: Secondary | ICD-10-CM | POA: Diagnosis not present

## 2024-02-17 DIAGNOSIS — R262 Difficulty in walking, not elsewhere classified: Secondary | ICD-10-CM | POA: Diagnosis not present

## 2024-02-17 DIAGNOSIS — Z8616 Personal history of COVID-19: Secondary | ICD-10-CM | POA: Diagnosis not present

## 2024-02-17 DIAGNOSIS — E43 Unspecified severe protein-calorie malnutrition: Secondary | ICD-10-CM | POA: Diagnosis not present

## 2024-02-17 DIAGNOSIS — I739 Peripheral vascular disease, unspecified: Secondary | ICD-10-CM | POA: Diagnosis not present

## 2024-02-17 DIAGNOSIS — K219 Gastro-esophageal reflux disease without esophagitis: Secondary | ICD-10-CM | POA: Diagnosis not present

## 2024-02-17 DIAGNOSIS — M6281 Muscle weakness (generalized): Secondary | ICD-10-CM | POA: Diagnosis not present

## 2024-02-17 DIAGNOSIS — I10 Essential (primary) hypertension: Secondary | ICD-10-CM | POA: Diagnosis not present

## 2024-02-18 DIAGNOSIS — I739 Peripheral vascular disease, unspecified: Secondary | ICD-10-CM | POA: Diagnosis not present

## 2024-02-18 DIAGNOSIS — Z8616 Personal history of COVID-19: Secondary | ICD-10-CM | POA: Diagnosis not present

## 2024-02-18 DIAGNOSIS — M6281 Muscle weakness (generalized): Secondary | ICD-10-CM | POA: Diagnosis not present

## 2024-02-18 DIAGNOSIS — G20A1 Parkinson's disease without dyskinesia, without mention of fluctuations: Secondary | ICD-10-CM | POA: Diagnosis not present

## 2024-02-18 DIAGNOSIS — R262 Difficulty in walking, not elsewhere classified: Secondary | ICD-10-CM | POA: Diagnosis not present

## 2024-02-18 DIAGNOSIS — E7849 Other hyperlipidemia: Secondary | ICD-10-CM | POA: Diagnosis not present

## 2024-02-18 DIAGNOSIS — K219 Gastro-esophageal reflux disease without esophagitis: Secondary | ICD-10-CM | POA: Diagnosis not present

## 2024-02-18 DIAGNOSIS — E43 Unspecified severe protein-calorie malnutrition: Secondary | ICD-10-CM | POA: Diagnosis not present

## 2024-03-05 DIAGNOSIS — G20A1 Parkinson's disease without dyskinesia, without mention of fluctuations: Secondary | ICD-10-CM | POA: Diagnosis not present

## 2024-03-05 DIAGNOSIS — I739 Peripheral vascular disease, unspecified: Secondary | ICD-10-CM | POA: Diagnosis not present

## 2024-03-05 DIAGNOSIS — R262 Difficulty in walking, not elsewhere classified: Secondary | ICD-10-CM | POA: Diagnosis not present

## 2024-03-08 DIAGNOSIS — G20C Parkinsonism, unspecified: Secondary | ICD-10-CM | POA: Diagnosis not present

## 2024-03-08 DIAGNOSIS — F5101 Primary insomnia: Secondary | ICD-10-CM | POA: Diagnosis not present

## 2024-03-09 DIAGNOSIS — L602 Onychogryphosis: Secondary | ICD-10-CM | POA: Diagnosis not present

## 2024-03-09 DIAGNOSIS — I739 Peripheral vascular disease, unspecified: Secondary | ICD-10-CM | POA: Diagnosis not present

## 2024-03-09 DIAGNOSIS — L603 Nail dystrophy: Secondary | ICD-10-CM | POA: Diagnosis not present

## 2024-03-12 DIAGNOSIS — E7849 Other hyperlipidemia: Secondary | ICD-10-CM | POA: Diagnosis not present

## 2024-03-12 DIAGNOSIS — Z8616 Personal history of COVID-19: Secondary | ICD-10-CM | POA: Diagnosis not present

## 2024-03-12 DIAGNOSIS — R262 Difficulty in walking, not elsewhere classified: Secondary | ICD-10-CM | POA: Diagnosis not present

## 2024-03-12 DIAGNOSIS — K219 Gastro-esophageal reflux disease without esophagitis: Secondary | ICD-10-CM | POA: Diagnosis not present

## 2024-03-12 DIAGNOSIS — G20A1 Parkinson's disease without dyskinesia, without mention of fluctuations: Secondary | ICD-10-CM | POA: Diagnosis not present

## 2024-03-12 DIAGNOSIS — I739 Peripheral vascular disease, unspecified: Secondary | ICD-10-CM | POA: Diagnosis not present

## 2024-03-12 DIAGNOSIS — E43 Unspecified severe protein-calorie malnutrition: Secondary | ICD-10-CM | POA: Diagnosis not present

## 2024-03-12 DIAGNOSIS — M6281 Muscle weakness (generalized): Secondary | ICD-10-CM | POA: Diagnosis not present

## 2024-03-27 DIAGNOSIS — R296 Repeated falls: Secondary | ICD-10-CM | POA: Diagnosis not present

## 2024-03-27 DIAGNOSIS — R2681 Unsteadiness on feet: Secondary | ICD-10-CM | POA: Diagnosis not present

## 2024-04-05 DIAGNOSIS — M6281 Muscle weakness (generalized): Secondary | ICD-10-CM | POA: Diagnosis not present

## 2024-04-05 DIAGNOSIS — G20A1 Parkinson's disease without dyskinesia, without mention of fluctuations: Secondary | ICD-10-CM | POA: Diagnosis not present

## 2024-04-05 DIAGNOSIS — K219 Gastro-esophageal reflux disease without esophagitis: Secondary | ICD-10-CM | POA: Diagnosis not present

## 2024-04-05 DIAGNOSIS — R262 Difficulty in walking, not elsewhere classified: Secondary | ICD-10-CM | POA: Diagnosis not present

## 2024-04-05 DIAGNOSIS — Z8616 Personal history of COVID-19: Secondary | ICD-10-CM | POA: Diagnosis not present

## 2024-04-05 DIAGNOSIS — E43 Unspecified severe protein-calorie malnutrition: Secondary | ICD-10-CM | POA: Diagnosis not present

## 2024-04-05 DIAGNOSIS — G20C Parkinsonism, unspecified: Secondary | ICD-10-CM | POA: Diagnosis not present

## 2024-04-05 DIAGNOSIS — F5101 Primary insomnia: Secondary | ICD-10-CM | POA: Diagnosis not present

## 2024-04-05 DIAGNOSIS — E7849 Other hyperlipidemia: Secondary | ICD-10-CM | POA: Diagnosis not present

## 2024-04-11 DIAGNOSIS — I10 Essential (primary) hypertension: Secondary | ICD-10-CM | POA: Diagnosis not present

## 2024-04-16 DIAGNOSIS — G20A1 Parkinson's disease without dyskinesia, without mention of fluctuations: Secondary | ICD-10-CM | POA: Diagnosis not present

## 2024-05-02 DIAGNOSIS — H353131 Nonexudative age-related macular degeneration, bilateral, early dry stage: Secondary | ICD-10-CM | POA: Diagnosis not present

## 2024-05-02 DIAGNOSIS — H2513 Age-related nuclear cataract, bilateral: Secondary | ICD-10-CM | POA: Diagnosis not present

## 2024-05-02 DIAGNOSIS — H524 Presbyopia: Secondary | ICD-10-CM | POA: Diagnosis not present

## 2024-05-03 DIAGNOSIS — G20C Parkinsonism, unspecified: Secondary | ICD-10-CM | POA: Diagnosis not present

## 2024-05-03 DIAGNOSIS — F5101 Primary insomnia: Secondary | ICD-10-CM | POA: Diagnosis not present

## 2024-05-05 DIAGNOSIS — G20A1 Parkinson's disease without dyskinesia, without mention of fluctuations: Secondary | ICD-10-CM | POA: Diagnosis not present

## 2024-05-05 DIAGNOSIS — E43 Unspecified severe protein-calorie malnutrition: Secondary | ICD-10-CM | POA: Diagnosis not present

## 2024-05-05 DIAGNOSIS — K219 Gastro-esophageal reflux disease without esophagitis: Secondary | ICD-10-CM | POA: Diagnosis not present

## 2024-05-05 DIAGNOSIS — E7849 Other hyperlipidemia: Secondary | ICD-10-CM | POA: Diagnosis not present

## 2024-05-05 DIAGNOSIS — R262 Difficulty in walking, not elsewhere classified: Secondary | ICD-10-CM | POA: Diagnosis not present

## 2024-05-05 DIAGNOSIS — I739 Peripheral vascular disease, unspecified: Secondary | ICD-10-CM | POA: Diagnosis not present

## 2024-05-05 DIAGNOSIS — Z8616 Personal history of COVID-19: Secondary | ICD-10-CM | POA: Diagnosis not present

## 2024-05-05 DIAGNOSIS — M6281 Muscle weakness (generalized): Secondary | ICD-10-CM | POA: Diagnosis not present

## 2024-05-07 ENCOUNTER — Encounter (HOSPITAL_COMMUNITY): Payer: Self-pay | Admitting: Emergency Medicine

## 2024-05-07 ENCOUNTER — Emergency Department (HOSPITAL_COMMUNITY)
Admission: EM | Admit: 2024-05-07 | Discharge: 2024-05-08 | Disposition: A | Discharge status: Skilled Nursing Facility | Attending: Emergency Medicine | Admitting: Emergency Medicine

## 2024-05-07 ENCOUNTER — Emergency Department (HOSPITAL_COMMUNITY)

## 2024-05-07 ENCOUNTER — Other Ambulatory Visit: Payer: Self-pay

## 2024-05-07 DIAGNOSIS — S42201A Unspecified fracture of upper end of right humerus, initial encounter for closed fracture: Secondary | ICD-10-CM | POA: Diagnosis not present

## 2024-05-07 DIAGNOSIS — W19XXXA Unspecified fall, initial encounter: Secondary | ICD-10-CM

## 2024-05-07 DIAGNOSIS — S0990XA Unspecified injury of head, initial encounter: Secondary | ICD-10-CM | POA: Diagnosis not present

## 2024-05-07 DIAGNOSIS — S199XXA Unspecified injury of neck, initial encounter: Secondary | ICD-10-CM | POA: Diagnosis not present

## 2024-05-07 DIAGNOSIS — G8911 Acute pain due to trauma: Secondary | ICD-10-CM | POA: Diagnosis not present

## 2024-05-07 DIAGNOSIS — G319 Degenerative disease of nervous system, unspecified: Secondary | ICD-10-CM | POA: Insufficient documentation

## 2024-05-07 DIAGNOSIS — S4981XA Other specified injuries of right shoulder and upper arm, initial encounter: Secondary | ICD-10-CM | POA: Diagnosis not present

## 2024-05-07 DIAGNOSIS — R9082 White matter disease, unspecified: Secondary | ICD-10-CM | POA: Diagnosis not present

## 2024-05-07 DIAGNOSIS — S42291A Other displaced fracture of upper end of right humerus, initial encounter for closed fracture: Secondary | ICD-10-CM | POA: Diagnosis not present

## 2024-05-07 DIAGNOSIS — M47812 Spondylosis without myelopathy or radiculopathy, cervical region: Secondary | ICD-10-CM | POA: Diagnosis not present

## 2024-05-07 DIAGNOSIS — Z8673 Personal history of transient ischemic attack (TIA), and cerebral infarction without residual deficits: Secondary | ICD-10-CM | POA: Diagnosis not present

## 2024-05-07 DIAGNOSIS — M25511 Pain in right shoulder: Secondary | ICD-10-CM | POA: Diagnosis not present

## 2024-05-07 DIAGNOSIS — W06XXXA Fall from bed, initial encounter: Secondary | ICD-10-CM | POA: Diagnosis not present

## 2024-05-07 DIAGNOSIS — S42351A Displaced comminuted fracture of shaft of humerus, right arm, initial encounter for closed fracture: Secondary | ICD-10-CM | POA: Diagnosis not present

## 2024-05-07 DIAGNOSIS — S129XXA Fracture of neck, unspecified, initial encounter: Secondary | ICD-10-CM | POA: Diagnosis not present

## 2024-05-07 DIAGNOSIS — M19011 Primary osteoarthritis, right shoulder: Secondary | ICD-10-CM | POA: Diagnosis not present

## 2024-05-07 DIAGNOSIS — G9389 Other specified disorders of brain: Secondary | ICD-10-CM | POA: Diagnosis not present

## 2024-05-07 DIAGNOSIS — I6782 Cerebral ischemia: Secondary | ICD-10-CM | POA: Diagnosis not present

## 2024-05-07 DIAGNOSIS — R9389 Abnormal findings on diagnostic imaging of other specified body structures: Secondary | ICD-10-CM | POA: Diagnosis not present

## 2024-05-07 DIAGNOSIS — Z743 Need for continuous supervision: Secondary | ICD-10-CM | POA: Diagnosis not present

## 2024-05-07 MED ORDER — HYDROCODONE-ACETAMINOPHEN 5-325 MG PO TABS: 2.0000 | ORAL_TABLET | ORAL | 0 refills | Status: DC | PRN

## 2024-05-07 MED ORDER — OXYCODONE HCL 5 MG PO TABS
5.0000 mg | ORAL_TABLET | Freq: Once | ORAL | Status: AC
  Administered 2024-05-07: 5 mg via ORAL
  Filled 2024-05-07: qty 1

## 2024-05-07 MED ORDER — ACETAMINOPHEN 500 MG PO TABS
1000.0000 mg | ORAL_TABLET | Freq: Once | ORAL | Status: AC
  Administered 2024-05-07: 1000 mg via ORAL
  Filled 2024-05-07: qty 2

## 2024-05-07 NOTE — Discharge Instructions (Addendum)
 Return for any problem.    Your shoulder needs close follow-up with orthopedics.  You should be seen in the office by Dr. Selinda Gosling.  The fracture in your shoulder may require operative treatment.  Close follow-up as instructed is important.

## 2024-05-07 NOTE — ED Notes (Signed)
 [facility] facility has been contacted by this RN 3 times with no answer. Trying to inquire about pharmacy preferences.

## 2024-05-07 NOTE — ED Triage Notes (Signed)
 Pt bib EMS from [place] for unwitnessed fall. Pt reports he fell out of bed onto right shoulder and did hit his head. A&O x2. CBG 115

## 2024-05-07 NOTE — Progress Notes (Signed)
 Orthopedic Tech Progress Note Patient Details:  Kevin Gould [birth year] [id number] Applied shoulder sling per order.  Ortho Devices Type of Ortho Device: Sling immobilizer Ortho Device/Splint Location: RUE Ortho Device/Splint Interventions: Ordered, Application, Adjustment   Post Interventions Patient Tolerated: Fair Instructions Provided: Care of device, Adjustment of device, Poper ambulation with device  [name] 05/07/2024, 12:19 PM

## 2024-05-07 NOTE — Care Management (Signed)
 Patient resides at [facility] no needs for DME, [facility]

## 2024-05-07 NOTE — ED Provider Notes (Signed)
 [facility] EMERGENCY DEPARTMENT AT [facility] Provider Note   CSN: [id number] Arrival date & time: 05/07/24  9063     Patient presents with: Kevin Gould is a 78 y.o. male.   78 year old male with prior medical history as detailed below presents for evaluation.  Patient resides at [facility].  Patient had a fall this morning.  He fell out of bed onto his right shoulder.  He reports that he hit his head.  He feels that he lost his balance as he was getting out of bed.  He complains of pain to the right shoulder.  The history is provided by the patient and medical records.       Prior to Admission medications   Medication Sig Start Date End Date Taking? Authorizing Provider  acetaminophen  (TYLENOL ) 500 MG tablet Take 1,000 mg by mouth 2 (two) times daily.    [provider]  carbidopa -levodopa  (SINEMET  IR) 25-100 MG tablet Take 2-3 tablets by mouth See admin instructions. Take 3 tablets by mouth at 8 AM & 12:30 PM and 2 tablets at 9 PM    [provider]  Cholecalciferol  (VITAMIN D3) 1000 units CAPS Take 2,000 Units by mouth in the morning.    [provider]  famotidine  (PEPCID ) 20 MG tablet Take 20 mg by mouth in the morning and at bedtime.    [provider]  feeding supplement, ENSURE ENLIVE, (ENSURE ENLIVE) LIQD Take 237 mLs by mouth 2 (two) times daily between meals. Patient not taking: Reported on 09/12/2023 08/22/19   Fausto Sor A, DO  Lidocaine  HCl (LIDAFLEX) 4 % PTCH Apply 1-3 patches topically See admin instructions. Apply 1-3 patches to the lower back at 9 PM and remove at 9 AM daily    [provider]  melatonin 3 MG TABS tablet Take 3 mg by mouth at bedtime.    [provider]  NON FORMULARY Take 120 mLs by mouth See admin instructions. MED PASS Fortified Nutrition Shakes - Drink 120 ml's by mouth two times a day    [provider]  senna (SENOKOT) 8.6 MG tablet Take 1 tablet by  mouth daily.    [provider]    Allergies: Patient has no known allergies.    Review of Systems  All other systems reviewed and are negative.   Updated Vital Signs BP (!) 165/99 (BP Location: Left Arm)   Pulse 67   Temp 97.7 F (36.5 C) (Oral)   Resp 15   SpO2 94%   Physical Exam Vitals and nursing note reviewed.  Constitutional:      General: He is not in acute distress.    Appearance: Normal appearance. He is well-developed.  HENT:     Head: Normocephalic and atraumatic.  Eyes:     Conjunctiva/sclera: Conjunctivae normal.     Pupils: Pupils are equal, round, and reactive to light.  Neck:     Comments: Cervical collar in place Cardiovascular:     Rate and Rhythm: Normal rate and regular rhythm.     Heart sounds: Normal heart sounds.  Pulmonary:     Effort: Pulmonary effort is normal. No respiratory distress.     Breath sounds: Normal breath sounds.  Abdominal:     General: There is no distension.     Palpations: Abdomen is soft.     Tenderness: There is no abdominal tenderness.  Musculoskeletal:        General: Swelling and tenderness present. No deformity.  Cervical back: Normal range of motion.     Comments: Tenderness and swelling noted to the right shoulder.  Distal right upper extremity is neurovascular intact.  Skin:    General: Skin is warm and dry.  Neurological:     General: No focal deficit present.     Mental Status: He is alert and oriented to person, place, and time.     (all labs ordered are listed, but only abnormal results are displayed) Labs Reviewed - No data to display  EKG: None  Radiology: No results found.   Procedures   Medications Ordered in the ED  acetaminophen  (TYLENOL ) tablet 1,000 mg (has no administration in time range)                                    Medical Decision Making Amount and/or Complexity of Data Reviewed Radiology: ordered.  Risk OTC drugs. Prescription drug  management.    Medical Screen Complete  This patient presented to the ED with complaint of fall.  This complaint involves an extensive number of treatment options. The initial differential diagnosis includes, but is not limited to, trauma from fall  This presentation is: Acute, Self-Limited, Previously Undiagnosed, Uncertain Prognosis, Complicated, Systemic Symptoms, and Threat to Life/Bodily Function  Patient presents after fall from bed.  Patient is a resident of [facility].  Exam suggest fracture of the right proximal humerus.  Imaging confirms this.  Given patient's significant findings on imaging Case discussed with Dr. Celena covering orthopedics.  He recommends close follow-up in the outpatient setting with Dr. Selinda Gosling.  Patient may benefit from reverse shoulder arthroplasty if his functional status is appropriate.  I attempted to reach out to the patient's listed emergency contact.  No answer obtained by attempted phone contact.  Nursing staff attempted to reach out to [facility] regarding patient's status and need for pharmacy for prescriptions to be filled out.  No answer.  Oncoming ED provider is aware of case.  Patient will be appropriate for discharge back to [facility].  I would like to make sure that he has medications for pain.    Patient does not appear to have capacity to fully understand discharge instructions.  Additional history obtained:  External records from outside sources obtained and reviewed including prior ED visits and prior Inpatient records.    Problem List / ED Course:  Right shoulder fracture   Disposition:  After consideration of the diagnostic results and the patients response to treatment, I feel that the patent would benefit from discharge.       Final diagnoses:  Fall, initial encounter  Closed fracture of proximal end of right humerus, unspecified fracture morphology, initial encounter    ED Discharge Orders           Ordered    HYDROcodone -acetaminophen  (NORCO/VICODIN) 5-325 MG tablet  Every 4 hours PRN        05/07/24 1501               Laurice Maude BROCKS, MD 05/07/24 1517

## 2024-05-07 NOTE — ED Notes (Signed)
 Pt cleaned and changed into new brief by NT.

## 2024-05-07 NOTE — ED Notes (Signed)
 [facility] called and transport arranged.

## 2024-05-08 DIAGNOSIS — E7849 Other hyperlipidemia: Secondary | ICD-10-CM | POA: Diagnosis not present

## 2024-05-08 DIAGNOSIS — E43 Unspecified severe protein-calorie malnutrition: Secondary | ICD-10-CM | POA: Diagnosis not present

## 2024-05-08 DIAGNOSIS — R531 Weakness: Secondary | ICD-10-CM | POA: Diagnosis not present

## 2024-05-08 DIAGNOSIS — R262 Difficulty in walking, not elsewhere classified: Secondary | ICD-10-CM | POA: Diagnosis not present

## 2024-05-08 DIAGNOSIS — I739 Peripheral vascular disease, unspecified: Secondary | ICD-10-CM | POA: Diagnosis not present

## 2024-05-08 DIAGNOSIS — M6281 Muscle weakness (generalized): Secondary | ICD-10-CM | POA: Diagnosis not present

## 2024-05-08 DIAGNOSIS — Z743 Need for continuous supervision: Secondary | ICD-10-CM | POA: Diagnosis not present

## 2024-05-08 DIAGNOSIS — W19XXXA Unspecified fall, initial encounter: Secondary | ICD-10-CM | POA: Diagnosis not present

## 2024-05-08 DIAGNOSIS — G20A1 Parkinson's disease without dyskinesia, without mention of fluctuations: Secondary | ICD-10-CM | POA: Diagnosis not present

## 2024-05-08 DIAGNOSIS — K219 Gastro-esophageal reflux disease without esophagitis: Secondary | ICD-10-CM | POA: Diagnosis not present

## 2024-05-09 DIAGNOSIS — E7849 Other hyperlipidemia: Secondary | ICD-10-CM | POA: Diagnosis not present

## 2024-05-09 DIAGNOSIS — G20A1 Parkinson's disease without dyskinesia, without mention of fluctuations: Secondary | ICD-10-CM | POA: Diagnosis not present

## 2024-05-09 DIAGNOSIS — Z8616 Personal history of COVID-19: Secondary | ICD-10-CM | POA: Diagnosis not present

## 2024-05-09 DIAGNOSIS — I739 Peripheral vascular disease, unspecified: Secondary | ICD-10-CM | POA: Diagnosis not present

## 2024-05-09 DIAGNOSIS — R262 Difficulty in walking, not elsewhere classified: Secondary | ICD-10-CM | POA: Diagnosis not present

## 2024-05-09 DIAGNOSIS — M6281 Muscle weakness (generalized): Secondary | ICD-10-CM | POA: Diagnosis not present

## 2024-05-09 DIAGNOSIS — K219 Gastro-esophageal reflux disease without esophagitis: Secondary | ICD-10-CM | POA: Diagnosis not present

## 2024-05-09 DIAGNOSIS — E43 Unspecified severe protein-calorie malnutrition: Secondary | ICD-10-CM | POA: Diagnosis not present

## 2024-05-16 DIAGNOSIS — I10 Essential (primary) hypertension: Secondary | ICD-10-CM | POA: Diagnosis not present

## 2024-05-24 DIAGNOSIS — E7849 Other hyperlipidemia: Secondary | ICD-10-CM | POA: Diagnosis not present

## 2024-05-24 DIAGNOSIS — M6281 Muscle weakness (generalized): Secondary | ICD-10-CM | POA: Diagnosis not present

## 2024-05-24 DIAGNOSIS — G20A1 Parkinson's disease without dyskinesia, without mention of fluctuations: Secondary | ICD-10-CM | POA: Diagnosis not present

## 2024-05-24 DIAGNOSIS — R262 Difficulty in walking, not elsewhere classified: Secondary | ICD-10-CM | POA: Diagnosis not present

## 2024-05-24 DIAGNOSIS — Z8616 Personal history of COVID-19: Secondary | ICD-10-CM | POA: Diagnosis not present

## 2024-05-24 DIAGNOSIS — S42291A Other displaced fracture of upper end of right humerus, initial encounter for closed fracture: Secondary | ICD-10-CM | POA: Diagnosis not present

## 2024-05-24 DIAGNOSIS — I739 Peripheral vascular disease, unspecified: Secondary | ICD-10-CM | POA: Diagnosis not present

## 2024-05-24 DIAGNOSIS — K219 Gastro-esophageal reflux disease without esophagitis: Secondary | ICD-10-CM | POA: Diagnosis not present

## 2024-05-24 DIAGNOSIS — E43 Unspecified severe protein-calorie malnutrition: Secondary | ICD-10-CM | POA: Diagnosis not present

## 2024-05-28 DIAGNOSIS — I959 Hypotension, unspecified: Secondary | ICD-10-CM | POA: Diagnosis not present

## 2024-05-28 DIAGNOSIS — R0603 Acute respiratory distress: Secondary | ICD-10-CM | POA: Diagnosis not present

## 2024-05-28 DIAGNOSIS — R000 Tachycardia, unspecified: Secondary | ICD-10-CM | POA: Diagnosis not present

## 2024-05-29 ENCOUNTER — Emergency Department (HOSPITAL_COMMUNITY)

## 2024-05-29 ENCOUNTER — Other Ambulatory Visit: Payer: Self-pay

## 2024-05-29 ENCOUNTER — Encounter (HOSPITAL_COMMUNITY): Payer: Self-pay | Admitting: Emergency Medicine

## 2024-05-29 ENCOUNTER — Inpatient Hospital Stay (HOSPITAL_COMMUNITY)

## 2024-05-29 ENCOUNTER — Inpatient Hospital Stay (HOSPITAL_COMMUNITY)
Admission: EM | Admit: 2024-05-29 | Discharge: 2024-06-27 | DRG: 871 | Disposition: E | Source: Skilled Nursing Facility | Attending: Pulmonary Disease | Admitting: Pulmonary Disease

## 2024-05-29 DIAGNOSIS — R64 Cachexia: Secondary | ICD-10-CM | POA: Diagnosis not present

## 2024-05-29 DIAGNOSIS — E870 Hyperosmolality and hypernatremia: Secondary | ICD-10-CM | POA: Diagnosis present

## 2024-05-29 DIAGNOSIS — Z87891 Personal history of nicotine dependence: Secondary | ICD-10-CM

## 2024-05-29 DIAGNOSIS — L89619 Pressure ulcer of right heel, unspecified stage: Secondary | ICD-10-CM | POA: Diagnosis present

## 2024-05-29 DIAGNOSIS — G20A1 Parkinson's disease without dyskinesia, without mention of fluctuations: Secondary | ICD-10-CM | POA: Diagnosis present

## 2024-05-29 DIAGNOSIS — N179 Acute kidney failure, unspecified: Secondary | ICD-10-CM | POA: Diagnosis not present

## 2024-05-29 DIAGNOSIS — A419 Sepsis, unspecified organism: Secondary | ICD-10-CM | POA: Diagnosis not present

## 2024-05-29 DIAGNOSIS — J9601 Acute respiratory failure with hypoxia: Secondary | ICD-10-CM | POA: Diagnosis not present

## 2024-05-29 DIAGNOSIS — R918 Other nonspecific abnormal finding of lung field: Secondary | ICD-10-CM | POA: Diagnosis not present

## 2024-05-29 DIAGNOSIS — K8020 Calculus of gallbladder without cholecystitis without obstruction: Secondary | ICD-10-CM | POA: Diagnosis not present

## 2024-05-29 DIAGNOSIS — L89629 Pressure ulcer of left heel, unspecified stage: Secondary | ICD-10-CM | POA: Diagnosis present

## 2024-05-29 DIAGNOSIS — Z515 Encounter for palliative care: Secondary | ICD-10-CM | POA: Diagnosis not present

## 2024-05-29 DIAGNOSIS — L89150 Pressure ulcer of sacral region, unstageable: Secondary | ICD-10-CM | POA: Diagnosis not present

## 2024-05-29 DIAGNOSIS — E872 Acidosis, unspecified: Secondary | ICD-10-CM | POA: Diagnosis present

## 2024-05-29 DIAGNOSIS — N170 Acute kidney failure with tubular necrosis: Secondary | ICD-10-CM | POA: Diagnosis present

## 2024-05-29 DIAGNOSIS — R6521 Severe sepsis with septic shock: Secondary | ICD-10-CM | POA: Diagnosis present

## 2024-05-29 DIAGNOSIS — E875 Hyperkalemia: Secondary | ICD-10-CM | POA: Diagnosis present

## 2024-05-29 DIAGNOSIS — K5649 Other impaction of intestine: Secondary | ICD-10-CM | POA: Diagnosis not present

## 2024-05-29 DIAGNOSIS — D539 Nutritional anemia, unspecified: Secondary | ICD-10-CM | POA: Diagnosis not present

## 2024-05-29 DIAGNOSIS — E8721 Acute metabolic acidosis: Secondary | ICD-10-CM | POA: Diagnosis not present

## 2024-05-29 DIAGNOSIS — K219 Gastro-esophageal reflux disease without esophagitis: Secondary | ICD-10-CM | POA: Diagnosis not present

## 2024-05-29 DIAGNOSIS — G9341 Metabolic encephalopathy: Secondary | ICD-10-CM | POA: Diagnosis present

## 2024-05-29 DIAGNOSIS — A4159 Other Gram-negative sepsis: Secondary | ICD-10-CM | POA: Diagnosis not present

## 2024-05-29 DIAGNOSIS — J189 Pneumonia, unspecified organism: Secondary | ICD-10-CM | POA: Diagnosis not present

## 2024-05-29 DIAGNOSIS — F0280 Dementia in other diseases classified elsewhere without behavioral disturbance: Secondary | ICD-10-CM | POA: Diagnosis present

## 2024-05-29 DIAGNOSIS — E7800 Pure hypercholesterolemia, unspecified: Secondary | ICD-10-CM | POA: Diagnosis present

## 2024-05-29 DIAGNOSIS — R296 Repeated falls: Secondary | ICD-10-CM | POA: Diagnosis present

## 2024-05-29 DIAGNOSIS — R0902 Hypoxemia: Secondary | ICD-10-CM

## 2024-05-29 DIAGNOSIS — Z9181 History of falling: Secondary | ICD-10-CM

## 2024-05-29 DIAGNOSIS — R0603 Acute respiratory distress: Secondary | ICD-10-CM | POA: Diagnosis not present

## 2024-05-29 DIAGNOSIS — R0602 Shortness of breath: Secondary | ICD-10-CM | POA: Diagnosis present

## 2024-05-29 DIAGNOSIS — K5900 Constipation, unspecified: Secondary | ICD-10-CM | POA: Diagnosis not present

## 2024-05-29 DIAGNOSIS — J690 Pneumonitis due to inhalation of food and vomit: Secondary | ICD-10-CM | POA: Diagnosis not present

## 2024-05-29 DIAGNOSIS — E861 Hypovolemia: Secondary | ICD-10-CM | POA: Diagnosis present

## 2024-05-29 DIAGNOSIS — I10 Essential (primary) hypertension: Secondary | ICD-10-CM | POA: Diagnosis present

## 2024-05-29 DIAGNOSIS — R404 Transient alteration of awareness: Secondary | ICD-10-CM | POA: Diagnosis not present

## 2024-05-29 DIAGNOSIS — I959 Hypotension, unspecified: Secondary | ICD-10-CM | POA: Diagnosis not present

## 2024-05-29 DIAGNOSIS — Z66 Do not resuscitate: Secondary | ICD-10-CM | POA: Diagnosis not present

## 2024-05-29 DIAGNOSIS — R531 Weakness: Secondary | ICD-10-CM | POA: Diagnosis not present

## 2024-05-29 LAB — RESPIRATORY PANEL BY PCR

## 2024-05-29 LAB — COMPREHENSIVE METABOLIC PANEL WITH GFR
ALT: 7 U/L (ref 0–44)
AST: 40 U/L (ref 15–41)
Albumin: 2.7 g/dL — ABNORMAL LOW (ref 3.5–5.0)
Alkaline Phosphatase: 176 U/L — ABNORMAL HIGH (ref 38–126)
Anion gap: 18 — ABNORMAL HIGH (ref 5–15)
BUN: 152 mg/dL — ABNORMAL HIGH (ref 8–23)
CO2: 20 mmol/L — ABNORMAL LOW (ref 22–32)
Calcium: 8.4 mg/dL — ABNORMAL LOW (ref 8.9–10.3)
Chloride: 126 mmol/L — ABNORMAL HIGH (ref 98–111)
Creatinine, Ser: 6.59 mg/dL — ABNORMAL HIGH (ref 0.61–1.24)
GFR, Estimated: 8 mL/min — ABNORMAL LOW
Glucose, Bld: 95 mg/dL (ref 70–99)
Potassium: 6 mmol/L — ABNORMAL HIGH (ref 3.5–5.1)
Sodium: 164 mmol/L (ref 135–145)
Total Bilirubin: 1.8 mg/dL — ABNORMAL HIGH (ref 0.0–1.2)
Total Protein: 6.3 g/dL — ABNORMAL LOW (ref 6.5–8.1)

## 2024-05-29 LAB — I-STAT CHEM 8, ED
BUN: >130 mg/dL — ABNORMAL HIGH (ref 8–23)
Calcium, Ion: 1.03 mmol/L — ABNORMAL LOW (ref 1.15–1.40)
Chloride: 128 mmol/L — ABNORMAL HIGH (ref 98–111)
Creatinine, Ser: 6.9 mg/dL — ABNORMAL HIGH (ref 0.61–1.24)
Glucose, Bld: 98 mg/dL (ref 70–99)
HCT: 35 % — ABNORMAL LOW (ref 39.0–52.0)
Hemoglobin: 11.9 g/dL — ABNORMAL LOW (ref 13.0–17.0)
Potassium: 5.3 mmol/L — ABNORMAL HIGH (ref 3.5–5.1)
Sodium: 162 mmol/L (ref 135–145)
TCO2: 22 mmol/L (ref 22–32)

## 2024-05-29 LAB — BLOOD CULTURE ID PANEL (REFLEXED) - BCID2

## 2024-05-29 LAB — SODIUM, URINE, RANDOM: Sodium, Ur: <10 mmol/L

## 2024-05-29 LAB — CBC WITH DIFFERENTIAL/PLATELET
Basophils Absolute: 0.1 10*3/uL (ref 0.0–0.1)
Basophils Relative: 1 %
Eosinophils Absolute: 0 10*3/uL (ref 0.0–0.5)
Eosinophils Relative: 0 %
HCT: 39.7 % (ref 39.0–52.0)
Hemoglobin: 11.7 g/dL — ABNORMAL LOW (ref 13.0–17.0)
Lymphocytes Relative: 6 %
Lymphs Abs: 0.5 10*3/uL — ABNORMAL LOW (ref 0.7–4.0)
MCH: 30.1 pg (ref 26.0–34.0)
MCHC: 29.5 g/dL — ABNORMAL LOW (ref 30.0–36.0)
MCV: 102.1 fL — ABNORMAL HIGH (ref 80.0–100.0)
Monocytes Absolute: 0.5 10*3/uL (ref 0.1–1.0)
Monocytes Relative: 6 %
Neutro Abs: 6.5 10*3/uL (ref 1.7–7.7)
Neutrophils Relative %: 86 %
Other: 1 %
Platelets: 198 10*3/uL (ref 150–400)
RBC: 3.89 MIL/uL — ABNORMAL LOW (ref 4.22–5.81)
RDW: 16.5 % — ABNORMAL HIGH (ref 11.5–15.5)
WBC: 7.6 10*3/uL (ref 4.0–10.5)
nRBC: 2.5 % — ABNORMAL HIGH (ref 0.0–0.2)

## 2024-05-29 LAB — BASIC METABOLIC PANEL WITH GFR
Anion gap: 14 (ref 5–15)
BUN: 113 mg/dL — ABNORMAL HIGH (ref 8–23)
CO2: 16 mmol/L — ABNORMAL LOW (ref 22–32)
Calcium: 5.9 mg/dL — CL (ref 8.9–10.3)
Chloride: 129 mmol/L — ABNORMAL HIGH (ref 98–111)
Creatinine, Ser: 4.75 mg/dL — ABNORMAL HIGH (ref 0.61–1.24)
GFR, Estimated: 12 mL/min — ABNORMAL LOW (ref >60)
Glucose, Bld: 253 mg/dL — ABNORMAL HIGH (ref 70–99)
Potassium: 4 mmol/L (ref 3.5–5.1)
Sodium: 159 mmol/L — ABNORMAL HIGH (ref 135–145)

## 2024-05-29 LAB — PROTEIN / CREATININE RATIO, URINE
Creatinine, Urine: 152 mg/dL
Protein Creatinine Ratio: 0.7 mg/mg{creat} — ABNORMAL HIGH (ref 0.00–0.15)
Total Protein, Urine: 107 mg/dL

## 2024-05-29 LAB — PROTIME-INR
INR: 1.6 — ABNORMAL HIGH (ref 0.8–1.2)
Prothrombin Time: 20 s — ABNORMAL HIGH (ref 11.4–15.2)

## 2024-05-29 LAB — I-STAT CG4 LACTIC ACID, ED
Lactic Acid, Venous: 4.1 mmol/L (ref 0.5–1.9)
Lactic Acid, Venous: 3.9 mmol/L (ref 0.5–1.9)
Lactic Acid, Venous: 4.8 mmol/L (ref 0.5–1.9)

## 2024-05-29 LAB — BLOOD GAS, VENOUS
Acid-base deficit: 5.3 mmol/L — ABNORMAL HIGH (ref 0.0–2.0)
Bicarbonate: 21.2 mmol/L (ref 20.0–28.0)
O2 Saturation: 49.5 %
Patient temperature: 37
pCO2, Ven: 44 mmHg (ref 44–60)
pH, Ven: 7.29 (ref 7.25–7.43)
pO2, Ven: 36 mmHg (ref 32–45)

## 2024-05-29 LAB — CBG MONITORING, ED: Glucose-Capillary: 139 mg/dL — ABNORMAL HIGH (ref 70–99)

## 2024-05-29 LAB — MRSA NEXT GEN BY PCR, NASAL: MRSA by PCR Next Gen: NOT DETECTED

## 2024-05-29 LAB — STREP PNEUMONIAE URINARY ANTIGEN: Strep Pneumo Urinary Antigen: NEGATIVE

## 2024-05-29 LAB — POTASSIUM: Potassium: 4.4 mmol/L (ref 3.5–5.1)

## 2024-05-29 MED ORDER — LINEZOLID 600 MG/300ML IV SOLN: 600.0000 mg | Freq: Two times a day (BID) | INTRAVENOUS | Status: DC

## 2024-05-29 MED ORDER — MORPHINE 100MG IN NS 100ML (1MG/ML) PREMIX INFUSION
0.0000 mg/h | INTRAVENOUS | Status: DC
  Administered 2024-05-29 – 2024-05-30 (×2): 5 mg/h via INTRAVENOUS
  Filled 2024-05-29 (×2): qty 100

## 2024-05-29 MED ORDER — CHLORHEXIDINE GLUCONATE CLOTH 2 % EX PADS
6.0000 | MEDICATED_PAD | Freq: Every day | CUTANEOUS | Status: DC
  Administered 2024-05-29: 6 via TOPICAL

## 2024-05-29 MED ORDER — MORPHINE BOLUS VIA INFUSION: 5.0000 mg | INTRAVENOUS | Status: DC | PRN

## 2024-05-29 MED ORDER — SODIUM BICARBONATE 8.4 % IV SOLN
50.0000 meq | Freq: Once | INTRAVENOUS | Status: AC
  Administered 2024-05-29: 50 meq via INTRAVENOUS
  Filled 2024-05-29: qty 50

## 2024-05-29 MED ORDER — LACTATED RINGERS IV BOLUS
1000.0000 mL | Freq: Once | INTRAVENOUS | Status: AC
  Administered 2024-05-29: 1000 mL via INTRAVENOUS

## 2024-05-29 MED ORDER — SODIUM CHLORIDE 0.9 % IV SOLN
2.0000 g | Freq: Once | INTRAVENOUS | Status: AC
  Administered 2024-05-29: 2 g via INTRAVENOUS
  Filled 2024-05-29: qty 12.5

## 2024-05-29 MED ORDER — ORAL CARE MOUTH RINSE
15.0000 mL | OROMUCOSAL | Status: DC
  Administered 2024-05-29 (×4): 15 mL via OROMUCOSAL

## 2024-05-29 MED ORDER — ACETAMINOPHEN 325 MG PO TABS
650.0000 mg | ORAL_TABLET | Freq: Four times a day (QID) | ORAL | Status: DC | PRN
Start: 2024-05-29 — End: 2024-05-30

## 2024-05-29 MED ORDER — CALCIUM GLUCONATE-NACL 2-0.675 GM/100ML-% IV SOLN
2.0000 g | Freq: Once | INTRAVENOUS | Status: AC
  Administered 2024-05-29: 2000 mg via INTRAVENOUS
  Filled 2024-05-29: qty 100

## 2024-05-29 MED ORDER — NOREPINEPHRINE 4 MG/250ML-% IV SOLN: 0.0000 ug/min | INTRAVENOUS | Status: DC

## 2024-05-29 MED ORDER — SODIUM BICARBONATE 8.4 % IV SOLN: 50.0000 meq | Freq: Once | INTRAVENOUS | Status: DC

## 2024-05-29 MED ORDER — SODIUM BICARBONATE 8.4 % IV SOLN
INTRAVENOUS | Status: DC
  Filled 2024-05-29: qty 1000

## 2024-05-29 MED ORDER — POLYVINYL ALCOHOL 1.4 % OP SOLN: 1.0000 [drp] | Freq: Four times a day (QID) | OPHTHALMIC | Status: DC | PRN

## 2024-05-29 MED ORDER — ORAL CARE MOUTH RINSE: 15.0000 mL | OROMUCOSAL | Status: DC | PRN

## 2024-05-29 MED ORDER — ACETAMINOPHEN 650 MG RE SUPP: 650.0000 mg | Freq: Four times a day (QID) | RECTAL | Status: DC | PRN

## 2024-05-29 MED ORDER — METRONIDAZOLE 500 MG/100ML IV SOLN: 500.0000 mg | Freq: Two times a day (BID) | INTRAVENOUS | Status: DC

## 2024-05-29 MED ORDER — GLYCOPYRROLATE 1 MG PO TABS: 1.0000 mg | ORAL_TABLET | ORAL | Status: DC | PRN

## 2024-05-29 MED ORDER — VANCOMYCIN HCL IN DEXTROSE 1-5 GM/200ML-% IV SOLN
1000.0000 mg | Freq: Once | INTRAVENOUS | Status: AC
  Administered 2024-05-29: 1000 mg via INTRAVENOUS
  Filled 2024-05-29: qty 200

## 2024-05-29 MED ORDER — DEXTROSE 50 % IV SOLN
1.0000 | Freq: Once | INTRAVENOUS | Status: AC
  Administered 2024-05-29: 50 mL via INTRAVENOUS
  Filled 2024-05-29: qty 50

## 2024-05-29 MED ORDER — INSULIN ASPART 100 UNIT/ML IV SOLN
5.0000 [IU] | Freq: Once | INTRAVENOUS | Status: AC
  Administered 2024-05-29: 5 [IU] via INTRAVENOUS
  Filled 2024-05-29: qty 0.05

## 2024-05-29 MED ORDER — VANCOMYCIN VARIABLE DOSE PER UNSTABLE RENAL FUNCTION (PHARMACIST DOSING): Status: DC

## 2024-05-29 MED ORDER — SODIUM CHLORIDE 0.9 % IV SOLN
250.0000 mL | INTRAVENOUS | Status: DC
  Administered 2024-05-29: 250 mL via INTRAVENOUS

## 2024-05-29 MED ORDER — GLYCOPYRROLATE 0.2 MG/ML IJ SOLN: 0.2000 mg | INTRAMUSCULAR | Status: DC | PRN

## 2024-05-29 MED ORDER — SODIUM CHLORIDE 0.9 % IV SOLN: 1.0000 g | INTRAVENOUS | Status: DC
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
## 2024-05-29 NOTE — Consult Note (Signed)
 WOC Nurse Consult Note: Reason for Consult: multiple wounds  Wound type: 1. L hand full thickness r/t trauma 100% red dry; R hip full thickness 100% red dry, L leg full thickness ? R/t trauma covered in dry hemorrhagic tissue  2.  B elbows Stage 1 Pressure Injury erythema  3.  R heel Deep Tissue Pressure Injury purple maroon discoloration skin intact  4.  Sacrum/buttocks with evolving deep tissue pressure injury, skin sloughing to reveal dark red purple tissue   Pressure Injury POA: Yes Measurement: see nursing flowsheet  Wound bed: as above  Drainage (amount, consistency, odor) see nursing flowsheet  Periwound: erythema surrounding buttocks/sacral DTPI  Dressing procedure/placement/frequency:  Cleanse sacral/buttocks wound with Vashe wound cleanser Soila [phone]) do not rinse and allow to air dry. Apply Xeroform gauze (Lawson [phone]) to wound beds daily and secure with silicone foam or ABD pads whichever works better.   Cleanse B elbows with soap and water, dry and apply silicone foam to protect areas. Lift daily to assess. Cleanse all abrasions (hands, legs, hips) with Vashe and cover with xeroform gauze Soila [phone]) every other day. Secure with silicone foam.  Cover R heel with Xeroform gauze (Lawson [phone]) every other day, secure with silicone foam and place R foot in Prevalon boot Soila [phone]) to offload pressure.  Patient should be placed on a low air loss mattress if moved out of ICU setting.    POC discussed with bedside nurse. Patient is comfort care at this time.  WOC team will not follow. Re-consult if further needs arise.   Thank you,    Powell Bar MSN, RN-BC, [employer]

## 2024-05-29 NOTE — Progress Notes (Signed)
 eLink Physician-Brief Progress Note Patient Name: Kevin Gould DOB: [birth year] MRN: [id number]   Date of Service  05/29/2024  HPI/Events of Note  78 year old male with a history of Parkinson's and dementia who presents from assisted living facility with respiratory failure and encephalopathy after recent fall and humeral fracture 7/12.  He is found to be in shock and and refractory hypoxemia, started on noninvasive ventilation in the setting of DNR/DNI.  Patient is tachypneic, bradycardic, hypertensive saturating 82% on NIV.  Results consistent with hypernatremia, anion gap metabolic acidosis, elevated creatinine, azotemia, macrocytic anemia.  Left basilar consolidation versus atelectasis.  Multifocal airspace disease on the CT with evidence of rectal impaction.  eICU Interventions  Continue broad-spectrum antibiotics-vancomycin Kevin Gould  Adequately resuscitated with crystalloids, may need to initiate norepinephrine  to maintain MAP greater than 65  Strong consideration for comfort care  DVT prophylaxisNone SCDs GI prophylaxis not currently indicated     Intervention Category Evaluation Type: New Patient Evaluation  Kevin Gould 05/29/2024, 6:51 AM
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
## 2024-05-29 NOTE — Progress Notes (Signed)
 ED Pharmacy Antibiotic Sign Off An antibiotic consult was received from an ED provider for Vancomycin  and Cefepime  per pharmacy dosing for sepsis. A chart review was completed to assess appropriateness.   The following one time order(s) were placed:  Vancomycin  1g IV Cefepime  2g IV  Further antibiotic and/or antibiotic pharmacy consults should be ordered by the admitting provider if indicated.   Thank you for allowing pharmacy to be a part of this patient's care.   Arvin Gauss, PharmD Clinical Pharmacist 05/29/24 2:37 AM

## 2024-05-29 NOTE — Progress Notes (Signed)
 Pt remains on BIPAP at this time.

## 2024-05-29 NOTE — ED Triage Notes (Signed)
 Pt BIBA from [facility] for possible sepsis. Pt was found to be hypotensive at facility, actively in respiratory distress on arrival. Sats in the 80's on non-rebreather. GCS 4.

## 2024-05-29 NOTE — ED Notes (Signed)
 Nurse  made aware of calcium  level.

## 2024-05-29 NOTE — IPAL (Signed)
  Interdisciplinary Goals of Care Family Meeting   Date carried out:: 05/29/2024  Location of the meeting: Phone conference  Member's involved: Physician and Family Member or next of kin  Durable Power of Attorney or acting medical decision maker: patient's cousin Kevin Gould    Discussion: We discussed goals of care for Kevin Gould .  I reviewed the patient's status, acute medical issues and organ system failure superimposed on his known chronic Parkinson's dementia, chronic wounds, recent right upper extremity injury.  Kevin Gould acknowledges that the patient is critically ill and likely will not survive.  She informed me that he has been quite clear about his goals for care, would not want extraordinary support or escalated care in this situation.  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  Based on our discussions we have agreed that the patient would want to transition to comfort care.  I will enact these orders.  We will discontinue the BiPAP, minimize his medications, avoid any invasive procedures.  Code status: Full DNR  Disposition: In-patient comfort care   Time spent for the meeting: 15 minutes  Kevin Gould 05/29/2024, 11:00 AM

## 2024-05-29 NOTE — H&P (Signed)
 NAME:  Kevin Gould, MRN:  [id number], DOB:  06/01/1946, LOS: 0 ADMISSION DATE:  05/29/2024, CONSULTATION DATE:  05/29/24 REFERRING MD:  EDP, CHIEF COMPLAINT:  shortness of breath   History of Present Illness:  78 yo male presented from assisted living facility with hypotension, respiratory distress and ams, pt does have baseline parkinson's and dementia. All history is obtained from chart at this time. Pt was last seen in hospital on 7/12 after fall and resultant R humerus fracture. During rounds today pt was found unresponsive in his room. Hypoxic with sats in the low 80's despite NRB. Hypotensive with sbp in 60's. Pt was started on bipap in ED, treated empirically for sepsis with broad spectrum abx and ivf. He was additionally found to have sodium of 164, K 6 aki with BUN/Cr 152/6.59 respectively, as well as lactic acidosis.  Imaging noted to have multilobar pna.   Family has been unreachable, ROS unobtainable 2/2 mental status. NIV is not ideal in this pt considering his mental status but with his previously expressed wishes alternatives are this for oxygen support or rapid transition to comfort care.   Pt is DNR/DNI. I have ordered 2 amps bicarb, calcium  for his hyperkalemia as he is unable to take lokelma. He has already been given insulin  and dextrose .   Pertinent  Medical History  Parkinsons Dementia Arthritis Gerd Headaches Htn hyperlipidemia  Significant Hospital Events: Including procedures, antibiotic start and stop dates in addition to other pertinent events   Admitted to hospital 8/3  Interim History / Subjective:    Objective    Blood pressure 135/60, pulse 79, resp. rate 17, SpO2 99%.    FiO2 (%):  [100 %] 100 % PEEP:  [5 cmH20] 5 cmH20 Pressure Support:  [10 cmH20] 10 cmH20   Intake/Output Summary (Last 24 hours) at 05/29/2024 0531 Last data filed at 05/29/2024 0320 Gross per 24 hour  Intake 100 ml  Output --  Net 100 ml   There were no vitals filed for this  visit.  Examination: General: Cachectic acute and chronically ill-appearing man HENT: Oropharynx dry, BiPAP in place Lungs: Coarse bilateral breath sounds especially left base.  No wheezing Cardiovascular: Distant, regular without a murmur Abdomen: Nondistended, positive bowel sounds Extremities: Significant bruising right upper extremity.  Not immobilized her in a sling Skin: Unstageable pressure ulcers on his sacrum, pressure ulcers present bilateral heels. Neuro: Completely unresponsive to voice, he does grimace and localize to pain.  Spontaneous movement of bilateral upper extremities.  Does not follow any commands GU: Foley in place with dark urine  Resolved problem list   Assessment and Plan  Acute hypoxic resp failure Acute left > right lower lobe hcap, gp/gn Suspected aspiration Aki Hyperkalemia Hypernatremia, consistent with hypovolemic Hypocalcemia Baseline dementia with acute ams as well Lactic acidosis, improving with support Anion-gap Metabolic acidosis Sacral decubitus wounds, present on admission -BiPAP in place, suboptimal given his impaired mental status and airway protection, risk for further aspiration.  Will try to transition off as able.  Goals of care clear, would not be intubated. - Antibiotics as ordered, Zosyn and vancomycin , dose based on renal function - Blood cultures, respiratory cultures ordered - Follow urine strep and Legionella studies - Follow RVP - Follow BMP and medically treat hyperkalemia as indicated - IV fluid resuscitation - Consider initiation pressors if volume resuscitation and adequate, but unclear efficacy here given his respiratory failure, encephalopathy and BiPAP dependence.  Will try to avoid.  If we do start then would  give through PIV and not perform invasive procedures for central access, etc. - Given his overall prognosis, his acute and chronic issues he is a poor candidate for hemodialysis.  Would not offer in this setting.   Plan to place a Foley now to adequately assess and measure urine output, perform urine studies - Follow lactic acid for clearance with volume resuscitation - WOC consultation - Need to continue to reach out to family.  Clearly critically ill with multiorgan failure and poor prognosis for survival.  As mentioned CODE STATUS has been clarified with a MOST form but need to determine other goals for care.  Given current status it would be appropriate to strongly consider transition to comfort measures.  Best Practice (right click and Reselect all SmartList Selections daily)   Diet/type: NPO DVT prophylaxis prophylactic heparin  Pressure ulcer(s): present on admission  GI prophylaxis: N/A Lines: N/A Foley:  Yes, and it is still needed Code Status:  limited Last date of multidisciplinary goals of care discussion [Multiple providers have called his contacts, cousins, on 8/3 but unable to reach them.  I attempted also, left VM for [employer]  I was able to reach his local friend and former coworker Marinell Beat to update him on status and prognosis.  He is aware that patient likely will not survive.  He confirmed that the patient would not want invasive life-sustaining measures based on his knowledge of the patient and also conversations with Christine]  Labs   CBC: Recent Labs  Lab 05/29/24 0216 05/29/24 0222  WBC 7.6  --   NEUTROABS PENDING  --   HGB 11.7* 11.9*  HCT 39.7 35.0*  MCV 102.1*  --   PLT 198  --     Basic Metabolic Panel: Recent Labs  Lab 05/29/24 0216 05/29/24 0222  NA 164* 162*  K 6.0* 5.3*  CL 126* 128*  CO2 20*  --   GLUCOSE 95 98  BUN 152* >130*  CREATININE 6.59* 6.90*  CALCIUM  8.4*  --    GFR: CrCl cannot be calculated (Unknown ideal weight.). Recent Labs  Lab 05/29/24 0216 05/29/24 0222 05/29/24 0447 05/29/24 0513  WBC 7.6  --   --   --   LATICACIDVEN  --  4.1* 4.8* 3.9*    Liver Function Tests: Recent Labs  Lab 05/29/24 0216  AST  40  ALT 7  ALKPHOS 176*  BILITOT 1.8*  PROT 6.3*  ALBUMIN  2.7*   No results for input(s): LIPASE, AMYLASE in the last 168 hours. No results for input(s): AMMONIA in the last 168 hours.  ABG    Component Value Date/Time   HCO3 21.2 05/29/2024 0231   TCO2 22 05/29/2024 0222   ACIDBASEDEF 5.3 (H) 05/29/2024 0231   O2SAT 49.5 05/29/2024 0231     Coagulation Profile: No results for input(s): INR, PROTIME in the last 168 hours.  Cardiac Enzymes: No results for input(s): CKTOTAL, CKMB, CKMBINDEX, TROPONINI in the last 168 hours.  HbA1C: Hgb A1c MFr Bld  Date/Time Value Ref Range Status  06/17/2019 02:07 AM 5.3 4.8 - 5.6 % Final    Comment:    (NOTE) Pre diabetes:          5.7%-6.4% Diabetes:              >6.4% Glycemic control for   <7.0% adults with diabetes   07/17/2008 11:28 AM 5.5 %     CBG: No results for input(s): GLUCAP in the last 168 hours.  Review of Systems:  Unable to obtain  Past Medical History:  He,  has a past medical history of Arthritis, Chronic lower back pain, Dementia (HCC), Falls frequently, GERD (gastroesophageal reflux disease), Headache(784.0), Hyperlipidemia, Hypertension, Parkinson's disease (HCC) (dx'd ~ 2012), and Vitamin D  deficiency (08/20/2019).   Surgical History:   Past Surgical History:  Procedure Laterality Date   EXCISIONAL HEMORRHOIDECTOMY  1993   INTRAMEDULLARY (IM) NAIL INTERTROCHANTERIC Left 08/18/2019   Procedure: INTRAMEDULLARY (IM) NAIL INTERTROCHANTRIC;  Surgeon: Kendal Franky SQUIBB, MD;  Location: [facility] OR;  Service: Orthopedics;  Laterality: Left;   TONSILLECTOMY     as a child     Social History:   reports that he quit smoking about 49 years ago. His smoking use included cigarettes. He started smoking about 61 years ago. He has a 12 pack-year smoking history. He has never used smokeless tobacco. He reports that he does not drink alcohol  and does not use drugs.   Family History:  His family history  is not on file. He was adopted.   Allergies No Known Allergies   Home Medications  Prior to Admission medications   Medication Sig Start Date End Date Taking? Authorizing Provider  acetaminophen  (TYLENOL ) 500 MG tablet Take 1,000 mg by mouth 2 (two) times daily.    [provider]  carbidopa -levodopa  (SINEMET  IR) 25-100 MG tablet Take 2-3 tablets by mouth See admin instructions. Take 3 tablets by mouth at 8 AM & 12:30 PM and 2 tablets at 9 PM    [provider]  Cholecalciferol  (VITAMIN D3) 1000 units CAPS Take 2,000 Units by mouth in the morning.    [provider]  famotidine  (PEPCID ) 20 MG tablet Take 20 mg by mouth in the morning and at bedtime.    [provider]  feeding supplement, ENSURE ENLIVE, (ENSURE ENLIVE) LIQD Take 237 mLs by mouth 2 (two) times daily between meals. Patient not taking: Reported on 09/12/2023 08/22/19   Fausto Sor A, DO  HYDROcodone -acetaminophen  (NORCO/VICODIN) 5-325 MG tablet Take 2 tablets by mouth every 4 (four) hours as needed. 05/07/24   Ula Prentice SAUNDERS, MD  Lidocaine  HCl (LIDAFLEX) 4 % PTCH Apply 1-3 patches topically See admin instructions. Apply 1-3 patches to the lower back at 9 PM and remove at 9 AM daily    [provider]  melatonin 3 MG TABS tablet Take 3 mg by mouth at bedtime.    [provider]  NON FORMULARY Take 120 mLs by mouth See admin instructions. MED PASS Fortified Nutrition Shakes - Drink 120 ml's by mouth two times a day    [provider]  senna (SENOKOT) 8.6 MG tablet Take 1 tablet by mouth daily.    [provider]     Critical care time: 60 minutes     Lamar Chris, MD, PhD 05/29/2024, 9:26 AM  Pulmonary and Critical Care [phone] or if no answer before 7:00PM call [phone] For any issues after 7:00PM please call eLink [phone]
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
## 2024-05-29 NOTE — Progress Notes (Signed)
 PHARMACY - PHYSICIAN COMMUNICATION CRITICAL VALUE ALERT - BLOOD CULTURE IDENTIFICATION (BCID)  Kevin Gould is an 78 y.o. male who presented to [facility] on 05/29/2024 with acute hypoxic respiratory failure, HCAP.  Patient in-patient comfort care and previous antibiotics d/c'ed 8/3.  Assessment:  BCID + Proteus (no ESBL) in 1/4 bottles  Name of physician (or Provider) Contacted: Dr Rober  Current antibiotics: none  Changes to prescribed antibiotics recommended:  No changes at this time - patient comfort care  Results for orders placed or performed during the hospital encounter of 05/29/24  Blood Culture ID Panel (Reflexed) (Collected: 05/29/2024  2:41 AM)  Result Value Ref Range   Enterococcus faecalis NOT DETECTED NOT DETECTED   Enterococcus Faecium NOT DETECTED NOT DETECTED   Listeria monocytogenes NOT DETECTED NOT DETECTED   Staphylococcus species NOT DETECTED NOT DETECTED   Staphylococcus aureus (BCID) NOT DETECTED NOT DETECTED   Staphylococcus epidermidis NOT DETECTED NOT DETECTED   Staphylococcus lugdunensis NOT DETECTED NOT DETECTED   Streptococcus species NOT DETECTED NOT DETECTED   Streptococcus agalactiae NOT DETECTED NOT DETECTED   Streptococcus pneumoniae NOT DETECTED NOT DETECTED   Streptococcus pyogenes NOT DETECTED NOT DETECTED   A.calcoaceticus-baumannii NOT DETECTED NOT DETECTED   Bacteroides fragilis NOT DETECTED NOT DETECTED   Enterobacterales DETECTED (A) NOT DETECTED   Enterobacter cloacae complex NOT DETECTED NOT DETECTED   Escherichia coli NOT DETECTED NOT DETECTED   Klebsiella aerogenes NOT DETECTED NOT DETECTED   Klebsiella oxytoca NOT DETECTED NOT DETECTED   Klebsiella pneumoniae NOT DETECTED NOT DETECTED   Proteus species DETECTED (A) NOT DETECTED   Salmonella species NOT DETECTED NOT DETECTED   Serratia marcescens NOT DETECTED NOT DETECTED   Haemophilus influenzae NOT DETECTED NOT DETECTED   Neisseria meningitidis NOT DETECTED NOT DETECTED    Pseudomonas aeruginosa NOT DETECTED NOT DETECTED   Stenotrophomonas maltophilia NOT DETECTED NOT DETECTED   Candida albicans NOT DETECTED NOT DETECTED   Candida auris NOT DETECTED NOT DETECTED   Candida glabrata NOT DETECTED NOT DETECTED   Candida krusei NOT DETECTED NOT DETECTED   Candida parapsilosis NOT DETECTED NOT DETECTED   Candida tropicalis NOT DETECTED NOT DETECTED   Cryptococcus neoformans/gattii NOT DETECTED NOT DETECTED   CTX-M ESBL NOT DETECTED NOT DETECTED   Carbapenem resistance IMP NOT DETECTED NOT DETECTED   Carbapenem resistance KPC NOT DETECTED NOT DETECTED   Carbapenem resistance NDM NOT DETECTED NOT DETECTED   Carbapenem resist OXA 48 LIKE NOT DETECTED NOT DETECTED   Carbapenem resistance VIM NOT DETECTED NOT DETECTED    Kemp Arvin Fletcher, PharmD 05/29/2024  11:44 PM

## 2024-05-29 NOTE — ED Notes (Signed)
 Nurse Hart made aware of calcium  result

## 2024-05-31 LAB — CULTURE, BLOOD (ROUTINE X 2)

## 2024-05-31 LAB — LEGIONELLA PNEUMOPHILA SEROGP 1 UR AG
L. pneumophila Serogp 1 Ur Ag: NEGATIVE
Source of Sample: 182246

## 2024-06-03 LAB — CULTURE, BLOOD (ROUTINE X 2): Culture: NO GROWTH

## 2024-06-27 NOTE — Progress Notes (Addendum)
 Pt expired at 0455 and death confirmed by Waddell Barefoot RN and Luke Hoff RN. [facility] notified of TOD.   Medical examiner Silvano Fell was notified of patient's death and patient was placed on medical examiner hold until there is an official determination that he will be a medical examiner's case. Patient was transferred to the morgue at [phone] and patient placement was notified of the hold on the body.   Medical examiner advised for the intensivist to not sign the death certificate and they will communicate with them to complete death certificate. Dr. Shelah and Dr. Annella made aware.  Next of kin Wanda Sacks notified of death and medical examiner hold. She was provided Patient Placement information and next steps.
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
## 2024-06-27 NOTE — Progress Notes (Signed)
 115mls of morphine  gtt was wasted by Luke Hoff RN and Ward Hurst RN after patient's expiration.

## 2024-06-27 NOTE — Plan of Care (Signed)

## 2024-06-27 DEATH — deceased
